# Patient Record
Sex: Female | Born: 1945
Health system: Southern US, Community
[De-identification: ages and names within clinical notes are randomized; demographics above are authoritative.]

## PROBLEM LIST (undated history)

## (undated) DIAGNOSIS — I429 Cardiomyopathy, unspecified: Secondary | ICD-10-CM

## (undated) DIAGNOSIS — F419 Anxiety disorder, unspecified: Secondary | ICD-10-CM

## (undated) DIAGNOSIS — F329 Major depressive disorder, single episode, unspecified: Secondary | ICD-10-CM

## (undated) DIAGNOSIS — F32A Depression, unspecified: Secondary | ICD-10-CM

## (undated) DIAGNOSIS — E860 Dehydration: Secondary | ICD-10-CM

## (undated) DIAGNOSIS — F0390 Unspecified dementia without behavioral disturbance: Secondary | ICD-10-CM

## (undated) DIAGNOSIS — I499 Cardiac arrhythmia, unspecified: Secondary | ICD-10-CM

## (undated) DIAGNOSIS — N189 Chronic kidney disease, unspecified: Secondary | ICD-10-CM

## (undated) DIAGNOSIS — Z87442 Personal history of urinary calculi: Secondary | ICD-10-CM

## (undated) DIAGNOSIS — B019 Varicella without complication: Secondary | ICD-10-CM

## (undated) DIAGNOSIS — E876 Hypokalemia: Secondary | ICD-10-CM

## (undated) DIAGNOSIS — I10 Essential (primary) hypertension: Secondary | ICD-10-CM

## (undated) HISTORY — DX: Cardiomyopathy, unspecified: I42.9

## (undated) HISTORY — PX: EYE SURGERY: SHX253

## (undated) HISTORY — PX: DILATION AND CURETTAGE OF UTERUS: SHX78

## (undated) HISTORY — PX: TUBAL LIGATION: SHX77

## (undated) HISTORY — DX: Varicella without complication: B01.9

---

## 2006-04-17 ENCOUNTER — Ambulatory Visit: Payer: Self-pay | Admitting: Cardiology

## 2006-04-17 ENCOUNTER — Inpatient Hospital Stay (HOSPITAL_COMMUNITY): Admission: EM | Admit: 2006-04-17 | Discharge: 2006-04-20 | Payer: Self-pay | Admitting: Emergency Medicine

## 2011-11-16 ENCOUNTER — Encounter (HOSPITAL_BASED_OUTPATIENT_CLINIC_OR_DEPARTMENT_OTHER): Payer: Self-pay | Admitting: *Deleted

## 2011-11-16 ENCOUNTER — Observation Stay (HOSPITAL_BASED_OUTPATIENT_CLINIC_OR_DEPARTMENT_OTHER)
Admission: EM | Admit: 2011-11-16 | Discharge: 2011-11-18 | Disposition: A | Discharge status: Home / Self Care | Payer: Medicare Other | Attending: Internal Medicine | Admitting: Internal Medicine

## 2011-11-16 ENCOUNTER — Emergency Department (HOSPITAL_BASED_OUTPATIENT_CLINIC_OR_DEPARTMENT_OTHER): Payer: Medicare Other

## 2011-11-16 DIAGNOSIS — H9319 Tinnitus, unspecified ear: Secondary | ICD-10-CM | POA: Insufficient documentation

## 2011-11-16 DIAGNOSIS — E785 Hyperlipidemia, unspecified: Secondary | ICD-10-CM | POA: Insufficient documentation

## 2011-11-16 DIAGNOSIS — I10 Essential (primary) hypertension: Secondary | ICD-10-CM | POA: Insufficient documentation

## 2011-11-16 DIAGNOSIS — H9311 Tinnitus, right ear: Secondary | ICD-10-CM | POA: Diagnosis present

## 2011-11-16 DIAGNOSIS — R42 Dizziness and giddiness: Secondary | ICD-10-CM | POA: Insufficient documentation

## 2011-11-16 DIAGNOSIS — R55 Syncope and collapse: Principal | ICD-10-CM | POA: Insufficient documentation

## 2011-11-16 DIAGNOSIS — E876 Hypokalemia: Secondary | ICD-10-CM | POA: Insufficient documentation

## 2011-11-16 HISTORY — DX: Essential (primary) hypertension: I10

## 2011-11-16 HISTORY — DX: Hypokalemia: E87.6

## 2011-11-16 LAB — URINALYSIS, ROUTINE W REFLEX MICROSCOPIC
Protein, ur: NEGATIVE mg/dL
Specific Gravity, Urine: 1.02 (ref 1.005–1.030)
pH: 5.5 (ref 5.0–8.0)

## 2011-11-16 LAB — CBC WITH DIFFERENTIAL/PLATELET
Basophils Absolute: 0 10*3/uL (ref 0.0–0.1)
Eosinophils Absolute: 0.2 10*3/uL (ref 0.0–0.7)
Eosinophils Relative: 2 % (ref 0–5)
MCHC: 34.9 g/dL (ref 30.0–36.0)
MCV: 93 fL (ref 78.0–100.0)
Monocytes Relative: 9 % (ref 3–12)
Neutro Abs: 8.7 10*3/uL — ABNORMAL HIGH (ref 1.7–7.7)
Neutrophils Relative %: 70 % (ref 43–77)
Platelets: 321 10*3/uL (ref 150–400)
RDW: 13.1 % (ref 11.5–15.5)

## 2011-11-16 LAB — COMPREHENSIVE METABOLIC PANEL
ALT: 13 U/L (ref 0–35)
AST: 14 U/L (ref 0–37)
Chloride: 100 mEq/L (ref 96–112)
GFR calc Af Amer: 41 mL/min — ABNORMAL LOW (ref >90)
GFR calc non Af Amer: 35 mL/min — ABNORMAL LOW (ref >90)
Potassium: 3.8 mEq/L (ref 3.5–5.1)
Total Bilirubin: 0.5 mg/dL (ref 0.3–1.2)
Total Protein: 7.1 g/dL (ref 6.0–8.3)

## 2011-11-16 LAB — GLUCOSE, CAPILLARY: Glucose-Capillary: 127 mg/dL — ABNORMAL HIGH (ref 70–99)

## 2011-11-16 LAB — TROPONIN I: Troponin I: <0.3 ng/mL (ref <0.30)

## 2011-11-16 MED ORDER — SODIUM CHLORIDE 0.9 % IV BOLUS (SEPSIS)
1000.0000 mL | Freq: Once | INTRAVENOUS | Status: AC
  Administered 2011-11-16: 1000 mL via INTRAVENOUS

## 2011-11-16 MED ORDER — SODIUM CHLORIDE 0.9 % IV SOLN
INTRAVENOUS | Status: AC
  Administered 2011-11-17: 02:00:00 via INTRAVENOUS

## 2011-11-16 MED ORDER — ONDANSETRON HCL 4 MG/2ML IJ SOLN: 4.0000 mg | Freq: Three times a day (TID) | INTRAMUSCULAR | Status: AC | PRN

## 2011-11-16 NOTE — ED Notes (Signed)
Pt has been having right ear pressure and pain with dizziness since last week. Pt states that this morning she went to restroom and passed out after standing. Pt is ambulatory at this time with a steady gait. No neuro deficits noted.

## 2011-11-16 NOTE — ED Notes (Signed)
Pt states she was up to BR/voided after cath done

## 2011-11-16 NOTE — ED Notes (Signed)
Took patient's blood sugar result was: 127. Nurse Notified.

## 2011-11-16 NOTE — ED Notes (Signed)
Pt did not void when felt urge-advised of need for cath UA-agreed

## 2011-11-16 NOTE — ED Provider Notes (Signed)
History     CSN: [id number]  Arrival date & time 11/16/11  1910   First MD Initiated Contact with Patient 11/16/11 1949      Chief Complaint  Patient presents with  . Near Syncope  . Dizziness    (Consider location/radiation/quality/duration/timing/severity/associated sxs/prior treatment) HPI Patient is a 66 year old female who presents today complaining of 2 weeks of having right-sided ear pressure changes in her hearing as well as some lightheadedness and spinning sensation over the past week. This morning the patient had this sensation and then syncopized. This was unwitnessed. Patient is uncertain how long she was out. She endorsed having some chest pain preceding this. She is currently pain free. She does note that she has decreased hearing out of right ear as well as a ringing sensation in her ear. She has been seen twice in urgent care over the past 2 weeks for her ear symptoms. She has tried prednisone as well as antibiotic and a sleeping pill for her symptoms without relief. She denies any current nausea, vomiting, or spinning sensation at this time. Patient became concerned today when she passed out as this has never happened before. She has history of hypertension, hyperlipidemia, and hypokalemia but no other medical problems. There are no other associated or modifying factors. Past Medical History  Diagnosis Date  . Hypertension   . Hyperlipidemia   . Hypokalemia     History reviewed. No pertinent past surgical history.  History reviewed. No pertinent family history.  History  Substance Use Topics  . Smoking status: Never Smoker   . Smokeless tobacco: Not on file  . Alcohol Use: No    OB History    Grav Para Term Preterm Abortions TAB SAB Ect Mult Living                  Review of Systems  Constitutional: Negative.   HENT: Positive for hearing loss and tinnitus.   Eyes: Negative.   Respiratory: Negative.   Cardiovascular: Positive for chest pain.    Gastrointestinal: Negative.   Genitourinary: Negative.   Musculoskeletal: Negative.   Neurological: Positive for dizziness, syncope and light-headedness.  Hematological: Negative.   Psychiatric/Behavioral: Negative.   All other systems reviewed and are negative.    Allergies  Review of patient's allergies indicates not on file.  Home Medications  No current outpatient prescriptions on file.  BP 114/70  Pulse 79  Temp 98.3 F (36.8 C) (Oral)  Resp 18  Ht 5\' 3"  (1.6 m)  Wt 131 lb (59.421 kg)  BMI 23.21 kg/m2  SpO2 98%  Physical Exam  Nursing note and vitals reviewed. GEN: Well-developed, well-nourished female in no distress HEENT: Atraumatic, normocephalic. Oropharynx clear without erythema. TMs clear bilaterally EYES: PERRLA BL, no scleral icterus. NECK: Trachea midline, no meningismus. Full pain-free range of motion CV: regular rate and rhythm. No murmurs, rubs, or gallops PULM: No respiratory distress.  No crackles, wheezes, or rales. GI: soft, non-tender. No guarding, rebound, or tenderness. + bowel sounds  GU: deferred Neuro: cranial nerves grossly 2-12 intact, no abnormalities of strength or sensation, A and O x 3. No pronator drift. No dysmetria on finger to nose task bilaterally. MSK: Patient moves all 4 extremities symmetrically, no deformity, edema, or injury noted Skin: No rashes petechiae, purpura, or jaundice Psych: no abnormality of mood   ED Course  Procedures (including critical care time)  Indication: Syncope Please note this EKG was reviewed extemporaneously by myself.   Date: 11/16/2011  Rate: 70  Rhythm: normal sinus rhythm  QRS Axis: normal  Intervals: normal  ST/T Wave abnormalities: normal  Conduction Disutrbances:none  Narrative Interpretation: Patient with normal QT compared to previous.  Old EKG Reviewed: changes noted   Labs Reviewed  GLUCOSE, CAPILLARY - Abnormal; Notable for the following:    Glucose-Capillary 127 (*)     All  other components within normal limits  COMPREHENSIVE METABOLIC PANEL  CBC WITH DIFFERENTIAL  URINALYSIS, ROUTINE W REFLEX MICROSCOPIC  TROPONIN I   Dg Chest 2 View  11/16/2011  *RADIOLOGY REPORT*  Clinical Data: Near-syncope.  Dizziness.  CHEST - 2 VIEW  Comparison: Chest x-ray 04/17/2006.  Findings: Lung volumes are normal.  No consolidative airspace disease.  No pleural effusions.  No pneumothorax.  No pulmonary nodule or mass noted.  Pulmonary vasculature and the cardiomediastinal silhouette are within normal limits.  IMPRESSION: 1. No radiographic evidence of acute cardiopulmonary disease.  Original Report Authenticated By: Florencia Reasons, M.D.   Ct Head Wo Contrast  11/16/2011  *RADIOLOGY REPORT*  Clinical Data: Syncope.  Dizziness.  Headache.  CT HEAD WITHOUT CONTRAST  Technique:  Contiguous axial images were obtained from the base of the skull through the vertex without contrast.  Comparison: None.  Findings: No mass lesion, mass effect, midline shift, hydrocephalus, hemorrhage.  No territorial ischemia or acute infarction.  Tiny focus high attenuation is present in the region of the left middle cerebral artery (image #9 series 2) however this is unlikely to represent a hyperdense MCA sign associated with acute territorial occlusion.  There are no secondary findings of infarction.  Paranasal sinuses appear within normal limits.  IMPRESSION: No acute intracranial abnormality.  Tiny focus high attenuation in the proximal left MCA day is favored to represent partial volume averaging artifact in the absence of clinical signs of left sided cerebral infarction.  Original Report Authenticated By: Andreas Newport, M.D.     1. Syncope       MDM  Patient was evaluated by myself. Based on evaluation patient had workup for syncope including CT head, EKG, chest x-ray, urinalysis, and basic blood work. Fingerstick was within normal limits. Patient was pain-free and not experiencing vertigo here. She  was given a liter of normal saline IV bolus during workup. Chest x-ray and head CT were unremarkable. Head CT did note a tiny focus of high attenuation in the proximal left MCA. Patient had elevated BUN/creatinine ratio but otherwise workup was largely unremarkable. She felt slightly better after 1 L of normal saline IV bolus. Given her age and her episode of syncope and that he was reasonable to admit her for observation. Patient was discussed with hospitalist accepted for transfer.        Cyndra Numbers, MD 11/16/11 [phone]

## 2011-11-16 NOTE — ED Notes (Signed)
Pt also states that she has lost 8 lbs in past week or so due to not eating because if her dizziness and ear pressure.

## 2011-11-16 NOTE — ED Notes (Signed)
Pt up to BR-steady gait-nun's cap in toilet for UO-pt states she missed-scant amt in cap

## 2011-11-17 ENCOUNTER — Encounter (HOSPITAL_COMMUNITY): Payer: Self-pay | Admitting: Internal Medicine

## 2011-11-17 ENCOUNTER — Observation Stay (HOSPITAL_COMMUNITY): Payer: Medicare Other

## 2011-11-17 DIAGNOSIS — E785 Hyperlipidemia, unspecified: Secondary | ICD-10-CM | POA: Diagnosis present

## 2011-11-17 DIAGNOSIS — H9311 Tinnitus, right ear: Secondary | ICD-10-CM | POA: Diagnosis present

## 2011-11-17 DIAGNOSIS — I10 Essential (primary) hypertension: Secondary | ICD-10-CM

## 2011-11-17 DIAGNOSIS — R55 Syncope and collapse: Principal | ICD-10-CM

## 2011-11-17 DIAGNOSIS — H9319 Tinnitus, unspecified ear: Secondary | ICD-10-CM

## 2011-11-17 LAB — LIPID PANEL
Cholesterol: 118 mg/dL (ref 0–200)
HDL: 33 mg/dL — ABNORMAL LOW (ref >39)
Total CHOL/HDL Ratio: 3.6 RATIO
Triglycerides: 75 mg/dL (ref <150)

## 2011-11-17 LAB — CARDIAC PANEL(CRET KIN+CKTOT+MB+TROPI)
CK, MB: 1.7 ng/mL (ref 0.3–4.0)
CK, MB: 1.7 ng/mL (ref 0.3–4.0)
CK, MB: 1.6 ng/mL (ref 0.3–4.0)
Relative Index: INVALID (ref 0.0–2.5)
Total CK: 38 U/L (ref 7–177)
Total CK: 37 U/L (ref 7–177)

## 2011-11-17 LAB — CBC
Hemoglobin: 11.3 g/dL — ABNORMAL LOW (ref 12.0–15.0)
MCV: 94.3 fL (ref 78.0–100.0)
Platelets: 244 10*3/uL (ref 150–400)
RBC: 3.53 MIL/uL — ABNORMAL LOW (ref 3.87–5.11)
WBC: 7.9 10*3/uL (ref 4.0–10.5)

## 2011-11-17 LAB — MAGNESIUM: Magnesium: 1.9 mg/dL (ref 1.5–2.5)

## 2011-11-17 LAB — COMPREHENSIVE METABOLIC PANEL
ALT: 9 U/L (ref 0–35)
CO2: 23 mEq/L (ref 19–32)
Calcium: 8.1 mg/dL — ABNORMAL LOW (ref 8.4–10.5)
Creatinine, Ser: 1.27 mg/dL — ABNORMAL HIGH (ref 0.50–1.10)
GFR calc Af Amer: 50 mL/min — ABNORMAL LOW (ref >90)
GFR calc non Af Amer: 43 mL/min — ABNORMAL LOW (ref >90)
Glucose, Bld: 108 mg/dL — ABNORMAL HIGH (ref 70–99)
Sodium: 142 mEq/L (ref 135–145)
Total Protein: 5.6 g/dL — ABNORMAL LOW (ref 6.0–8.3)

## 2011-11-17 MED ORDER — ASPIRIN EC 81 MG PO TBEC
81.0000 mg | DELAYED_RELEASE_TABLET | Freq: Every day | ORAL | Status: DC
  Administered 2011-11-17 – 2011-11-18 (×2): 81 mg via ORAL
  Filled 2011-11-17 (×2): qty 1

## 2011-11-17 MED ORDER — SODIUM CHLORIDE 0.9 % IJ SOLN
3.0000 mL | Freq: Two times a day (BID) | INTRAMUSCULAR | Status: DC
  Administered 2011-11-17: 3 mL via INTRAVENOUS

## 2011-11-17 MED ORDER — ACETAMINOPHEN 325 MG PO TABS: 650.0000 mg | ORAL_TABLET | Freq: Four times a day (QID) | ORAL | Status: DC | PRN

## 2011-11-17 MED ORDER — ONDANSETRON HCL 4 MG PO TABS: 4.0000 mg | ORAL_TABLET | Freq: Four times a day (QID) | ORAL | Status: DC | PRN

## 2011-11-17 MED ORDER — GADOBENATE DIMEGLUMINE 529 MG/ML IV SOLN
10.0000 mL | Freq: Once | INTRAVENOUS | Status: AC
  Administered 2011-11-17: 10 mL via INTRAVENOUS

## 2011-11-17 MED ORDER — ACETAMINOPHEN 650 MG RE SUPP: 650.0000 mg | Freq: Four times a day (QID) | RECTAL | Status: DC | PRN

## 2011-11-17 MED ORDER — MORPHINE SULFATE 2 MG/ML IJ SOLN: 1.0000 mg | INTRAMUSCULAR | Status: DC | PRN

## 2011-11-17 MED ORDER — POTASSIUM CHLORIDE CRYS ER 20 MEQ PO TBCR
20.0000 meq | EXTENDED_RELEASE_TABLET | Freq: Two times a day (BID) | ORAL | Status: DC
  Administered 2011-11-17 – 2011-11-18 (×4): 20 meq via ORAL
  Filled 2011-11-17 (×6): qty 1

## 2011-11-17 MED ORDER — ZOLPIDEM TARTRATE 5 MG PO TABS
5.0000 mg | ORAL_TABLET | Freq: Every evening | ORAL | Status: DC | PRN
  Administered 2011-11-17: 5 mg via ORAL
  Filled 2011-11-17: qty 1

## 2011-11-17 MED ORDER — SODIUM CHLORIDE 0.9 % IV SOLN
INTRAVENOUS | Status: DC
  Administered 2011-11-17 (×2): via INTRAVENOUS

## 2011-11-17 MED ORDER — LORAZEPAM 0.5 MG PO TABS
0.5000 mg | ORAL_TABLET | Freq: Four times a day (QID) | ORAL | Status: DC | PRN
  Administered 2011-11-17: 0.5 mg via ORAL
  Filled 2011-11-17: qty 1

## 2011-11-17 MED ORDER — ENOXAPARIN SODIUM 40 MG/0.4ML ~~LOC~~ SOLN
40.0000 mg | Freq: Every day | SUBCUTANEOUS | Status: DC
  Administered 2011-11-17 – 2011-11-18 (×2): 40 mg via SUBCUTANEOUS
  Filled 2011-11-17 (×2): qty 0.4

## 2011-11-17 MED ORDER — ONDANSETRON HCL 4 MG/2ML IJ SOLN
4.0000 mg | Freq: Four times a day (QID) | INTRAMUSCULAR | Status: DC | PRN
  Administered 2011-11-18: 4 mg via INTRAVENOUS
  Filled 2011-11-17: qty 2

## 2011-11-17 NOTE — Progress Notes (Signed)
  Echocardiogram 2D Echocardiogram has been performed.  Jacqualynn Parco FRANCES 11/17/2011, 11:55 AM

## 2011-11-17 NOTE — Care Management Note (Unsigned)
    Page 1 of 1   11/18/2011     10:45:08 AM   CARE MANAGEMENT NOTE 11/18/2011  Patient:  Colleen Davis, Colleen Davis   Account Number:  [id number]  Date Initiated:  11/17/2011  Documentation initiated by:  SIMMONS,Bosco Paparella  Subjective/Objective Assessment:   ADMITTED WITH PRE-SYNCOPE; LIVES AT HOME ALONE; WAS IPTA; USES [facility] ON [address] FOR RX.     Action/Plan:   DISCHARGE PLANNING DISCUSSED AT BEDSIDE.   Anticipated DC Date:  11/18/2011   Anticipated DC Plan:  HOME/SELF CARE      DC Planning Services  CM consult      Choice offered to / List presented to:             Status of service:  In process, will continue to follow Medicare Important Message given?   (If response is "NO", the following Medicare IM given date fields will be blank) Date Medicare IM given:   Date Additional Medicare IM given:    Discharge Disposition:    Per UR Regulation:  Reviewed for med. necessity/level of care/duration of stay  If discussed at Long Length of Stay Meetings, dates discussed:    Comments:  11/18/11  1044  Latori Beggs SIMMONS RN, BSN [phone] MD-  PLEASE ORDER AND SIGN OP PT FORM IN FRONT OF SHADOW CHART FOR OP PT. THANKS.   11/17/11  1121  Chase Arnall SIMMONS RN, BSN [phone] NCM WILL FOLLOW.

## 2011-11-17 NOTE — Evaluation (Signed)
Physical Therapy Evaluation Patient Details Name: Colleen Davis MRN: [id number] DOB: Apr 24, 1945 Today's Date: 11/17/2011 Time: 4098-1191 PT Time Calculation (min): 51 min  PT Assessment / Plan / Recommendation Clinical Impression  pt presents with R ear sounds of "ocean roaring", MD notes R ear infection, however pt notes no ear infection.  pt indicates initially MD put her on prednisone without relief, then an antibiotic and sleeping pill that only helped her to fall asleep, but no resolution of symptoms.  pt indicates 3 episodes of dizziness and "funny feelings" on her R side.  pt with + nystagmus and symtoms during gait with head turns and with hallpike to R.  Performed repositioning for R BPPV and pt ed on x1 exercises and gaze stabilization during mobility.  Feel pt would benefit from ENT f/u as pt notes she ahd been told that she might have tinnitus and that the "roaring" noise she hears is permanent.  pt would also benefit from OPPT for Vestibular f/u.      PT Assessment  Patient needs continued PT services    Follow Up Recommendations  Outpatient PT (Vestibular f/u)    Barriers to Discharge None      Equipment Recommendations  None recommended by PT    Recommendations for Other Services     Frequency Min 3X/week    Precautions / Restrictions Precautions Precautions: Fall Restrictions Weight Bearing Restrictions: No   Pertinent Vitals/Pain Denies Pain.        Mobility  Bed Mobility Bed Mobility: Supine to Sit;Sitting - Scoot to [place] of Bed;Sit to Supine Rolling Right: 7: Independent Rolling Left: 7: Independent Left Sidelying to Sit: 7: Independent Supine to Sit: 7: Independent Sitting - Scoot to Edge of Bed: 7: Independent Sit to Supine: 7: Independent Transfers Transfers: Sit to Stand;Stand to Sit Sit to Stand: 5: Supervision;From bed Stand to Sit: 5: Supervision;To bed Ambulation/Gait Ambulation/Gait Assistance: 5: Supervision Ambulation Distance (Feet): 180  Feet Assistive device: None Ambulation/Gait Assistance Details: pt moves very slowly and cautiously.  When normalizing speed and adding head turns pt becomes unsteady and has LOB requiring MinA to correct.  Mild Nystagmus noted with pt feeling like she is falling to R side.  Symptoms last ~59min at longest.  Vertical head movements elicit symptoms, but not as severe.   Gait Pattern: Step-through pattern;Decreased stride length Stairs: No Wheelchair Mobility Wheelchair Mobility: No    Exercises     PT Diagnosis: Difficulty walking  PT Problem List: Decreased activity tolerance;Decreased balance;Decreased mobility PT Treatment Interventions: Gait training;Stair training;Functional mobility training;Therapeutic activities;Therapeutic exercise;Balance training;Neuromuscular re-education;Patient/family education   PT Goals Acute Rehab PT Goals PT Goal Formulation: With patient Time For Goal Achievement: 11/24/11 Potential to Achieve Goals: Good Pt will go Sit to Stand: Independently PT Goal: Sit to Stand - Progress: Goal set today Pt will go Stand to Sit: Independently PT Goal: Stand to Sit - Progress: Goal set today Pt will Ambulate: >150 feet;Independently PT Goal: Ambulate - Progress: Goal set today Pt will Perform Home Exercise Program: Independently PT Goal: Perform Home Exercise Program - Progress: Goal set today  Visit Information  Last PT Received On: 11/17/11 Assistance Needed: +1 PT/OT Co-Evaluation/Treatment: Yes    Subjective Data  Subjective: IT's so hard to describe.   Patient Stated Goal: BAck to normal.     Prior Functioning  Home Living Lives With: Alone Available Help at Discharge: Family;Available PRN/intermittently Type of Home: Mobile home Home Access: Ramped entrance Home Layout: One level Bathroom Shower/Tub: Tub/shower  unit;Curtain Bathroom Toilet: Pharmacist, community: No Home Adaptive Equipment: Walker - standard Prior Function Level of  Independence: Independent Able to Take Stairs?: Yes Driving: Yes Vocation: Retired Musician: No difficulties Dominant Hand: Right    Cognition  Overall Cognitive Status: Appears within functional limits for tasks assessed/performed Arousal/Alertness: Awake/alert Orientation Level: Appears intact for tasks assessed Behavior During Session: [place] for tasks performed    Extremity/Trunk Assessment Right Upper Extremity Assessment RUE ROM/Strength/Tone: [facility] for tasks assessed Left Upper Extremity Assessment LUE ROM/Strength/Tone: WFL for tasks assessed Right Lower Extremity Assessment RLE ROM/Strength/Tone: Within functional levels RLE Sensation: WFL - Light Touch Left Lower Extremity Assessment LLE ROM/Strength/Tone: Within functional levels LLE Sensation: WFL - Light Touch Trunk Assessment Trunk Assessment: Normal   Balance Balance Balance Assessed: Yes Dynamic Sitting Balance Dynamic Sitting - Balance Support: Feet supported Dynamic Sitting - Level of Assistance: 7: Independent Dynamic Sitting - Balance Activities: Forward lean/weight shifting  End of Session PT - End of Session Equipment Utilized During Treatment: Gait belt Activity Tolerance: Patient tolerated treatment well Patient left: in bed;with call bell/phone within reach;with nursing in room Nurse Communication: Mobility status  GP Functional Assessment Tool Used: Clinical Judgement Functional Limitation: Mobility: Walking and moving around Mobility: Walking and Moving Around Current Status (Z6109): At least 1 percent but less than 20 percent impaired, limited or restricted Mobility: Walking and Moving Around Goal Status [phone]): 0 percent impaired, limited or restricted   [name], [place] [phone] 11/17/2011, 11:47 AM

## 2011-11-17 NOTE — H&P (Signed)
Colleen Davis is an 66 y.o. female.   Chief Complaint: Dizziness and syncope HPI: 66 year-old female with known history of hypertension and hyperlipidemia who has been doing fine until last night when she started feeling dizzy. She has been having problems have right ear and was being treated for it yet 8 and possibly infection. She has been on antibiotics for about a week. Mainly taking amoxicillin. She was also placed on trazodone for lack of sleeping. Since then she's been feeling dizzy and tired. She got up today and almost passed out. This happened several times during the day and was taken to [facility]. She has not had a repeat episode since being admitted. She continues to have tinnitus in the right ear. No discharge. No nausea vomiting or diarrhea.  Past Medical History  Diagnosis Date  . Hypertension   . Hyperlipidemia   . Hypokalemia     History reviewed. No pertinent past surgical history.  History reviewed. No pertinent family history. Social History:  reports that she has never smoked. She does not have any smokeless tobacco history on file. She reports that she does not drink alcohol or use illicit drugs.  Allergies: No Known Allergies  Medications Prior to Admission  Medication Sig Dispense Refill  . amoxicillin (AMOXIL) 875 MG tablet Take 875 mg by mouth 2 (two) times daily.      Marland Kitchen aspirin 81 MG tablet Take 81 mg by mouth daily.      Marland Kitchen lisinopril-hydrochlorothiazide (PRINZIDE,ZESTORETIC) 20-25 MG per tablet Take 1 tablet by mouth daily.      . potassium chloride SA (K-DUR,KLOR-CON) 20 MEQ tablet Take 20 mEq by mouth 2 (two) times daily.      . traZODone (DESYREL) 50 MG tablet Take 50 mg by mouth at bedtime.        Results for orders placed during the hospital encounter of 11/16/11 (from the past 48 hour(s))  GLUCOSE, CAPILLARY     Status: Abnormal   Collection Time   11/16/11  7:35 PM      Component Value Range Comment   Glucose-Capillary 127 (*) 70 - 99  mg/dL    Comment 1 Notify RN     COMPREHENSIVE METABOLIC PANEL     Status: Abnormal   Collection Time   11/16/11  8:50 PM      Component Value Range Comment   Sodium 139  135 - 145 mEq/L    Potassium 3.8  3.5 - 5.1 mEq/L    Chloride 100  96 - 112 mEq/L    CO2 26  19 - 32 mEq/L    Glucose, Bld 117 (*) 70 - 99 mg/dL    BUN 38 (*) 6 - 23 mg/dL    Creatinine, Ser 6.21 (*) 0.50 - 1.10 mg/dL    Calcium 9.4  8.4 - 30.8 mg/dL    Total Protein 7.1  6.0 - 8.3 g/dL    Albumin 4.5  3.5 - 5.2 g/dL    AST 14  0 - 37 U/L    ALT 13  0 - 35 U/L    Alkaline Phosphatase 84  39 - 117 U/L    Total Bilirubin 0.5  0.3 - 1.2 mg/dL    GFR calc non Af Amer 35 (*) >90 mL/min    GFR calc Af Amer 41 (*) >90 mL/min   CBC WITH DIFFERENTIAL     Status: Abnormal   Collection Time   11/16/11  8:50 PM  Component Value Range Comment   WBC 12.4 (*) 4.0 - 10.5 K/uL    RBC 4.13  3.87 - 5.11 MIL/uL    Hemoglobin 13.4  12.0 - 15.0 g/dL    HCT 30.8  65.7 - 84.6 %    MCV 93.0  78.0 - 100.0 fL    MCH 32.4  26.0 - 34.0 pg    MCHC 34.9  30.0 - 36.0 g/dL    RDW 96.2  95.2 - 84.1 %    Platelets 321  150 - 400 K/uL    Neutrophils Relative 70  43 - 77 %    Lymphocytes Relative 19  12 - 46 %    Monocytes Relative 9  3 - 12 %    Eosinophils Relative 2  0 - 5 %    Basophils Relative 0  0 - 1 %    Neutro Abs 8.7 (*) 1.7 - 7.7 K/uL    Lymphs Abs 2.4  0.7 - 4.0 K/uL    Monocytes Absolute 1.1 (*) 0.1 - 1.0 K/uL    Eosinophils Absolute 0.2  0.0 - 0.7 K/uL    Basophils Absolute 0.0  0.0 - 0.1 K/uL   TROPONIN I     Status: Normal   Collection Time   11/16/11  8:50 PM      Component Value Range Comment   Troponin I <0.30  <0.30 ng/mL   URINALYSIS, ROUTINE W REFLEX MICROSCOPIC     Status: Normal   Collection Time   11/16/11  9:41 PM      Component Value Range Comment   Color, Urine YELLOW  YELLOW    APPearance CLEAR  CLEAR    Specific Gravity, Urine 1.020  1.005 - 1.030    pH 5.5  5.0 - 8.0    Glucose, UA NEGATIVE   NEGATIVE mg/dL    Hgb urine dipstick NEGATIVE  NEGATIVE    Bilirubin Urine NEGATIVE  NEGATIVE    Ketones, ur NEGATIVE  NEGATIVE mg/dL    Protein, ur NEGATIVE  NEGATIVE mg/dL    Urobilinogen, UA 1.0  0.0 - 1.0 mg/dL    Nitrite NEGATIVE  NEGATIVE    Leukocytes, UA NEGATIVE  NEGATIVE MICROSCOPIC NOT DONE ON URINES WITH NEGATIVE PROTEIN, BLOOD, LEUKOCYTES, NITRITE, OR GLUCOSE <1000 mg/dL.   Dg Chest 2 View  11/16/2011  *RADIOLOGY REPORT*  Clinical Data: Near-syncope.  Dizziness.  CHEST - 2 VIEW  Comparison: Chest x-ray 04/17/2006.  Findings: Lung volumes are normal.  No consolidative airspace disease.  No pleural effusions.  No pneumothorax.  No pulmonary nodule or mass noted.  Pulmonary vasculature and the cardiomediastinal silhouette are within normal limits.  IMPRESSION: 1. No radiographic evidence of acute cardiopulmonary disease.  Original Report Authenticated By: Florencia Reasons, M.D.   Ct Head Wo Contrast  11/16/2011  *RADIOLOGY REPORT*  Clinical Data: Syncope.  Dizziness.  Headache.  CT HEAD WITHOUT CONTRAST  Technique:  Contiguous axial images were obtained from the base of the skull through the vertex without contrast.  Comparison: None.  Findings: No mass lesion, mass effect, midline shift, hydrocephalus, hemorrhage.  No territorial ischemia or acute infarction.  Tiny focus high attenuation is present in the region of the left middle cerebral artery (image #9 series 2) however this is unlikely to represent a hyperdense MCA sign associated with acute territorial occlusion.  There are no secondary findings of infarction.  Paranasal sinuses appear within normal limits.  IMPRESSION: No acute intracranial abnormality.  Tiny focus high attenuation in  the proximal left MCA day is favored to represent partial volume averaging artifact in the absence of clinical signs of left sided cerebral infarction.  Original Report Authenticated By: Andreas Newport, M.D.    Review of Systems  Constitutional:  Positive for malaise/fatigue.  HENT: Positive for ear pain and tinnitus. Negative for sore throat and ear discharge.   Eyes: Negative.   Respiratory: Negative.   Cardiovascular: Negative.   Gastrointestinal: Negative.   Genitourinary: Negative.   Musculoskeletal: Negative.   Skin: Negative.   Neurological: Positive for loss of consciousness, weakness and headaches.  Endo/Heme/Allergies: Negative.   Psychiatric/Behavioral: Negative.     Blood pressure 137/82, pulse 72, temperature 97.5 F (36.4 C), temperature source Oral, resp. rate 19, height 5\' 3"  (1.6 m), weight 62.2 kg (137 lb 2 oz), SpO2 98.00%. Physical Exam  Constitutional: She is oriented to person, place, and time. She appears well-developed and well-nourished.  HENT:  Head: Normocephalic and atraumatic.  Right Ear: External ear normal.  Left Ear: External ear normal.  Nose: Nose normal.  Mouth/Throat: Oropharynx is clear and moist.  Eyes: Conjunctivae and EOM are normal. Pupils are equal, round, and reactive to light.  Neck: Normal range of motion. Neck supple.  Cardiovascular: Normal rate, regular rhythm, normal heart sounds and intact distal pulses.   Respiratory: Effort normal and breath sounds normal.  GI: Soft. Bowel sounds are normal.  Musculoskeletal: Normal range of motion.  Neurological: She is alert and oriented to person, place, and time. She has normal reflexes.  Skin: Skin is warm and dry.  Psychiatric: She has a normal mood and affect. Her behavior is normal. Judgment and thought content normal.     Assessment/Plan A 66 year old female presenting with syncope and dizziness. More than likely the dizziness is secondary to her right ear infection or disease. She's been on antibiotics. Her syncopal episode possibly related to the new medication in this case trazodone. Patient could also have arrhythmias CVA.  Plan #1 syncope: Patient will be admitted for observation. Will check echocardiogram carotid Dopplers  and MRI of the brain. If all these tests are negative it is probably secondary to inner ear disease with possibly trazodone as a cause. Will get PT and OT to evaluate patient. Check orthostasis.  #2 tinnitus: Most likely in infection but also could be VIII cranial nerve tumor or disease. The MRI will hopefully give [place] some picture of what is going on in that area.  #3 hypertension: Continue home medications and titrate:  #4 hyperlipidemia: Check fasting lipid panel and continue her home medications.  #5 prophylaxis: Patient is on Lovenox.  GARBA,LAWAL 11/17/2011, 2:37 AM

## 2011-11-17 NOTE — Progress Notes (Signed)
Patient admitted this morning with dizziness. Noted abnormality on MRI. [facility] consult neurology for their input. Patient feels okay today. Physical therapy's recommendations noted.

## 2011-11-17 NOTE — Progress Notes (Signed)
Pt. received the scheduled dose of Potassium 20 Meq after the morning labs came back with Potassium 3.3.

## 2011-11-17 NOTE — Evaluation (Signed)
Occupational Therapy Evaluation Patient Details Name: Colleen Davis MRN: [id number] DOB: [birth year] Today's Date: 11/17/2011 Time: 4098-1191 OT Time Calculation (min): 29 min  OT Assessment / Plan / Recommendation Clinical Impression  Pt is a 66 year old woman admitted with syncope and falls.  She has been having problems have right ear and was being treated for it yet 8 and possibly infection and diagnosed with tinnitus.  Pt is at a supervision to independent level in ADL and ADL transfers due to intermittent dizziness.  Safety education completed.       OT Assessment  Patient does not need any further OT services    Follow Up Recommendations  Supervision - Intermittent    Barriers to Discharge      Equipment Recommendations  None recommended by OT    Recommendations for Other Services    Frequency       Precautions / Restrictions Precautions Precautions: Fall   Pertinent Vitals/Pain No pain    ADL  Eating/Feeding: Simulated;Independent Where Assessed - Eating/Feeding: Bed level Grooming: Simulated;Supervision/safety Where Assessed - Grooming: Unsupported standing Upper Body Bathing: Simulated;Set up Where Assessed - Upper Body Bathing: Unsupported sitting Lower Body Bathing: Simulated;Supervision/safety Where Assessed - Lower Body Bathing: Unsupported sit to stand Upper Body Dressing: Performed;Set up Where Assessed - Upper Body Dressing: Unsupported sitting Lower Body Dressing: Performed;Supervision/safety Where Assessed - Lower Body Dressing: Unsupported sit to stand Equipment Used: Gait belt Transfers/Ambulation Related to ADLs: Pt requiring min guard assist until challenged with head turns and then had LOB requiring min assist to regain balance.    OT Diagnosis:    OT Problem List:   OT Treatment Interventions:     OT Goals    Visit Information  Last OT Received On: 11/17/11 Assistance Needed: +1 PT/OT Co-Evaluation/Treatment: Yes    Subjective Data  Subjective: "I went to urgent care." Patient Stated Goal: Resolve issues with dizziness.   Prior Functioning  Vision/Perception  Home Living Lives With: Alone Available Help at Discharge: Family;Available PRN/intermittently Type of Home: Mobile home Home Access: Ramped entrance Home Layout: One level Bathroom Shower/Tub: Tub/shower unit;Curtain Firefighter: Standard Bathroom Accessibility: No Home Adaptive Equipment: Environmental consultant - standard Prior Function Level of Independence: Independent Able to Take Stairs?: Yes Driving: Yes Vocation: Retired Musician: No difficulties Dominant Hand: Right      Cognition  Overall Cognitive Status: Appears within functional limits for tasks assessed/performed Arousal/Alertness: Awake/alert Orientation Level: Appears intact for tasks assessed Behavior During Session: [facility] for tasks performed    Extremity/Trunk Assessment Right Upper Extremity Assessment RUE ROM/Strength/Tone: [facility] for tasks assessed Left Upper Extremity Assessment LUE ROM/Strength/Tone: WFL for tasks assessed Trunk Assessment Trunk Assessment: Normal   Mobility Bed Mobility Bed Mobility: Supine to Sit;Sitting - Scoot to Edge of Bed;Sit to Supine Supine to Sit: 7: Independent Sitting - Scoot to Edge of Bed: 7: Independent Sit to Supine: 7: Independent Transfers Transfers: Sit to Stand;Stand to Sit Sit to Stand: 5: Supervision;From bed Stand to Sit: 5: Supervision;To bed   Exercise    Balance Balance Balance Assessed: Yes Dynamic Sitting Balance Dynamic Sitting - Balance Support: Feet supported Dynamic Sitting - Level of Assistance: 7: Independent Dynamic Sitting - Balance Activities: Forward lean/weight shifting  End of Session OT - End of Session Equipment Utilized During Treatment: Gait belt Activity Tolerance: Patient tolerated treatment well Patient left: in bed;with family/visitor present;Other (comment) (with PT performing vestibular  evaluation)  GO Functional Assessment Tool Used: clinical observation/judgement Functional Limitation: Self care Self Care  Current Status [phone]): 0 percent impaired, limited or restricted Self Care Goal Status ([phone]): 0 percent impaired, limited or restricted Self Care Discharge Status [phone]): 0 percent impaired, limited or restricted   Evern Bio 11/17/2011, 10:29 AM [phone]

## 2011-11-18 NOTE — Progress Notes (Signed)
Physical Therapy Treatment Patient Details Name: Colleen Davis MRN: [id number] DOB: 1945-06-23 Today's Date: 11/18/2011 Time: 7829-5621 PT Time Calculation (min): 23 min  PT Assessment / Plan / Recommendation Comments on Treatment Session  Patient progressing well with ambulation. She did not complain of dizziness this session and complained of slight ringing in ear. Continue ambulation with RW    Follow Up Recommendations  Outpatient PT (Vestibular f/u)    Barriers to Discharge        Equipment Recommendations  Other (comment) (TBD)    Recommendations for Other Services    Frequency Min 3X/week   Plan Discharge plan remains appropriate;Frequency remains appropriate    Precautions / Restrictions Precautions Precautions: Fall Restrictions Weight Bearing Restrictions: No   Pertinent Vitals/Pain     Mobility  Bed Mobility Supine to Sit: 7: Independent Sitting - Scoot to Edge of Bed: 7: Independent Transfers Sit to Stand: 5: Supervision;With upper extremity assist;From bed;From chair/3-in-1 Stand to Sit: 5: Supervision;With upper extremity assist;To bed;To chair/3-in-1 Details for Transfer Assistance: Cues for safe hand placement and technique.  Ambulation/Gait Ambulation/Gait Assistance: 5: Supervision Ambulation Distance (Feet): 320 Feet Assistive device: Rolling walker Ambulation/Gait Assistance Details: Ambulated patient with RW. Patient states that she feels more stable with RW and complained of no dizziness with ambulation Gait Pattern: Step-through pattern;Decreased stride length    Exercises     PT Diagnosis:    PT Problem List:   PT Treatment Interventions:     PT Goals Acute Rehab PT Goals PT Goal: Sit to Stand - Progress: Progressing toward goal PT Goal: Stand to Sit - Progress: Progressing toward goal PT Goal: Ambulate - Progress: Progressing toward goal  Visit Information  Last PT Received On: 11/18/11 Assistance Needed: +1    Subjective Data      Cognition  Overall Cognitive Status: Appears within functional limits for tasks assessed/performed Arousal/Alertness: Awake/alert Orientation Level: Appears intact for tasks assessed Behavior During Session: [facility] for tasks performed    Balance     End of Session PT - End of Session Equipment Utilized During Treatment: Gait belt Activity Tolerance: Patient tolerated treatment well Patient left: in bed;with call bell/phone within reach Nurse Communication: Mobility status   GP Functional Assessment Tool Used: clinical judgement Functional Limitation: Mobility: Walking and moving around Mobility: Walking and Moving Around Current Status [phone]): At least 1 percent but less than 20 percent impaired, limited or restricted Mobility: Walking and Moving Around Goal Status [phone]): 0 percent impaired, limited or restricted   Fredrich Birks 11/18/2011, 9:40 AM 11/18/2011 Fredrich Birks PTA [phone] pager [phone] office

## 2011-11-18 NOTE — Discharge Summary (Signed)
Physician Discharge Summary  Colleen Davis ZOX:[id number] DOB: March 02, 1946 DOA: 11/16/2011  PCP: No primary provider on file.  Admit date: 11/16/2011 Discharge date: 11/18/2011  Recommendations for Outpatient Follow-up:  1.  Follow-up Information    Follow up with Physical therapy. (for vestibular rehab)       Schedule an appointment as soon as possible for a visit with [facility] .   Contact information:   [address] [place] [address] [phone]      Schedule an appointment as soon as possible for a visit with ENT.         Discharge Diagnoses:  Syncope and collapse  HTN (hypertension)  Tinnitus of right ear  Hyperlipidemia   Discharge Condition: Stable Disposition: HomeHome Diet recommendation: Heart healthy diet   Wt Readings from Last 3 Encounters:  11/17/11 62.2 kg (137 lb 2 oz)    History of present illness:  66 year-old female with known history of hypertension and hyperlipidemia who has been doing fine until last night when she started feeling dizzy. She has been having problems have right ear and was being treated for it yet 8 and possibly infection. She has been on antibiotics for about a week. Mainly taking amoxicillin. She was also placed on trazodone for lack of sleeping. Since then she's been feeling dizzy and tired. She got up today and almost passed out. This happened several times during the day and was taken to [facility]. She has not had a repeat episode since being admitted. She continues to have tinnitus in the right ear. No discharge. No nausea vomiting or diarrhea   Hospital Course:   #1 Syncope/Benign Paroxysmal Positional Vertigo: Patient had 2-D echo carotid Dopplers and MRI of the brain done. MRI showed some  nonspecific findings.  Discussed the abnormality with Dr. Thad Ranger. Per our discussion she thinks this is most likely secondary to the hypertension. Patient will be discharged home to  followup outpatient with neurology.  #2Tinnitus/BPPV: Patient to followup outpatient with ENT and also at the  vestibular clinic. #3Hypertension: Patient to continue taking her home medication #4 Hyperlipidemia:Follow up outpatient.  #5 Abnormal MRI: Discussed with neurology Dr. Thad Ranger who reviewed the MRI.Marland Kitchen Changes are most likely related to hypertension. Recommendation is to follow up outpatient with neurology. Discussed this with the patient.     Discharge Instructions  Discharge Orders    Future Orders Please Complete By Expires   Diet - low sodium heart healthy      Increase activity slowly      Discharge instructions      Comments:   Patient would like prescription for Ativan. She will discuss this with her primary care physician.     Medication List  As of 11/18/2011  2:15 PM   TAKE these medications         amoxicillin 875 MG tablet   Commonly known as: AMOXIL   Take 875 mg by mouth 2 (two) times daily.      aspirin 81 MG tablet   Take 81 mg by mouth daily.      lisinopril-hydrochlorothiazide 20-25 MG per tablet   Commonly known as: PRINZIDE,ZESTORETIC   Take 1 tablet by mouth daily.      potassium chloride SA 20 MEQ tablet   Commonly known as: K-DUR,KLOR-CON   Take 20 mEq by mouth 2 (two) times daily.      traZODone 50 MG tablet   Commonly known as: DESYREL   Take  50 mg by mouth at bedtime.           Follow-up Information    Follow up with Physical therapy. (for vestibular rehab)       Schedule an appointment as soon as possible for a visit with [facility] .   Contact information:   [address] [place] [address] [phone]      Schedule an appointment as soon as possible for a visit with ENT.          The results of significant diagnostics from this hospitalization (including imaging, microbiology, ancillary and laboratory) are listed below for reference.    Significant Diagnostic Studies: Dg  Chest 2 View  11/16/2011  *RADIOLOGY REPORT*  Clinical Data: Near-syncope.  Dizziness.  CHEST - 2 VIEW  Comparison: Chest x-ray 04/17/2006.  Findings: Lung volumes are normal.  No consolidative airspace disease.  No pleural effusions.  No pneumothorax.  No pulmonary nodule or mass noted.  Pulmonary vasculature and the cardiomediastinal silhouette are within normal limits.  IMPRESSION: 1. No radiographic evidence of acute cardiopulmonary disease.  Original Report Authenticated By: Florencia Reasons, M.D.   Ct Head Wo Contrast  11/16/2011  *RADIOLOGY REPORT*  Clinical Data: Syncope.  Dizziness.  Headache.  CT HEAD WITHOUT CONTRAST  Technique:  Contiguous axial images were obtained from the base of the skull through the vertex without contrast.  Comparison: None.  Findings: No mass lesion, mass effect, midline shift, hydrocephalus, hemorrhage.  No territorial ischemia or acute infarction.  Tiny focus high attenuation is present in the region of the left middle cerebral artery (image #9 series 2) however this is unlikely to represent a hyperdense MCA sign associated with acute territorial occlusion.  There are no secondary findings of infarction.  Paranasal sinuses appear within normal limits.  IMPRESSION: No acute intracranial abnormality.  Tiny focus high attenuation in the proximal left MCA day is favored to represent partial volume averaging artifact in the absence of clinical signs of left sided cerebral infarction.  Original Report Authenticated By: Andreas Newport, M.D.   Mr Laqueta Jean Wo Contrast  11/17/2011  *RADIOLOGY REPORT*  Clinical Data: Syncope  MRI HEAD WITHOUT AND WITH CONTRAST  Technique:  Multiplanar, multiecho pulse sequences of the brain and surrounding structures were obtained according to standard protocol without and with intravenous contrast  Contrast: 10mL MULTIHANCE GADOBENATE DIMEGLUMINE 529 MG/ML IV SOLN  Comparison: CT head without contrast 11/16/2011  Findings: The diffusion weighted  images demonstrate no evidence for acute or subacute infarction.  Mild generalized atrophy is present. Extensive subcortical T2 and FLAIR hyperintensities are present. The ventricles are of normal size.  Flow is present in the major intracranial arteries.  The globes and orbits are intact.  The paranasal sinuses and mastoid air cells are clear.  The postcontrast images demonstrate no pathologic enhancement.  IMPRESSION:  1.  No acute intracranial abnormality. 2.  Moderate subcortical T2 and FLAIR hyperintensities bilaterally. The finding is nonspecific but can be seen in the setting of chronic microvascular ischemia, a demyelinating process such as multiple sclerosis, vasculitis, complicated migraine headaches, or as the sequelae of a prior infectious or inflammatory process.  Original Report Authenticated By: Jamesetta Orleans. MATTERN, M.D.     Labs: Basic Metabolic Panel:  Lab 11/17/11 6213 11/16/11 2050  NA 142 139  K 3.3* 3.8  CL 107 100  CO2 23 26  GLUCOSE 108* 117*  BUN 27* 38*  CREATININE 1.27* 1.50*  CALCIUM 8.1* 9.4  MG 1.9 --  PHOS -- --   Liver Function Tests:  Lab 11/17/11 0319 11/16/11 2050  AST 11 14  ALT 9 13  ALKPHOS 69 84  BILITOT 0.4 0.5  PROT 5.6* 7.1  ALBUMIN 3.2* 4.5   No results found for this basename: LIPASE:5,AMYLASE:5 in the last 168 hours No results found for this basename: AMMONIA:5 in the last 168 hours CBC:  Lab 11/17/11 0319 11/16/11 2050  WBC 7.9 12.4*  NEUTROABS -- 8.7*  HGB 11.3* 13.4  HCT 33.3* 38.4  MCV 94.3 93.0  PLT 244 321   Cardiac Enzymes:  Lab 11/17/11 2045 11/17/11 1032 11/17/11 0301 11/16/11 2050  CKTOTAL 37 35 38 --  CKMB 1.6 1.7 1.7 --  CKMBINDEX -- -- -- --  TROPONINI <0.30 <0.30 <0.30 <0.30   BNP: BNP (last 3 results)  Basename 11/17/11 0301  PROBNP 68.2   CBG:  Lab 11/16/11 1935  GLUCAP 127*      Time coordinating discharge: 55 Signed: Molli Posey, MD [facility] 11/18/2011, 2:15 PM

## 2011-11-19 ENCOUNTER — Encounter (HOSPITAL_COMMUNITY): Payer: Self-pay | Admitting: *Deleted

## 2011-11-19 ENCOUNTER — Emergency Department (HOSPITAL_COMMUNITY)
Admission: EM | Admit: 2011-11-19 | Discharge: 2011-11-19 | Disposition: A | Discharge status: Home / Self Care | Payer: Medicare Other | Attending: Emergency Medicine | Admitting: Emergency Medicine

## 2011-11-19 DIAGNOSIS — Z711 Person with feared health complaint in whom no diagnosis is made: Secondary | ICD-10-CM

## 2011-11-19 DIAGNOSIS — K029 Dental caries, unspecified: Secondary | ICD-10-CM | POA: Insufficient documentation

## 2011-11-19 DIAGNOSIS — R220 Localized swelling, mass and lump, head: Secondary | ICD-10-CM | POA: Insufficient documentation

## 2011-11-19 DIAGNOSIS — I10 Essential (primary) hypertension: Secondary | ICD-10-CM | POA: Insufficient documentation

## 2011-11-19 DIAGNOSIS — E785 Hyperlipidemia, unspecified: Secondary | ICD-10-CM | POA: Insufficient documentation

## 2011-11-19 NOTE — ED Notes (Signed)
Reports being on antibiotics recently for right side dental abscess. Reports last night getting swelling to right side of face and right arm. Then reports suddenly having swelling today to bilateral lower legs and ankles. Only mild swelling noted at triage, airway intact, no acute distress noted.

## 2011-11-19 NOTE — ED Provider Notes (Signed)
Medical screening examination/treatment/procedure(s) were performed by non-physician practitioner and as supervising physician I was immediately available for consultation/collaboration.   Takenya Travaglini, MD 11/19/11 2222 

## 2011-11-19 NOTE — ED Provider Notes (Signed)
History     CSN: [id number]  Arrival date & time 11/19/11  1758   First MD Initiated Contact with Patient 11/19/11 1959      Chief Complaint  Patient presents with  . Leg Swelling  . Facial Swelling   HPI  History provided by the patient. Patient is a 66 year old female with history of hypertension, hyperlipidemia who presents with concerns for possible adverse reaction to amoxicillin. Patient reports having a third dose of amoxicillin early this morning around 8 AM. This evening around 6 PM patient reports developing some sensations of swelling on her right face and in her bilateral feet. Patient is taking amoxicillin for a infected right upper molar tooth and ear infection. Patient denies having any previous adverse reactions to amoxicillin. She denies any rash or pruritus. She denies any swelling of the tongue, lips or throat. She denies any typical to breathing or shortness of breath. She denies any chest pain or heart palpitations.     Past Medical History  Diagnosis Date  . Hypertension   . Hyperlipidemia   . Hypokalemia     History reviewed. No pertinent past surgical history.  History reviewed. No pertinent family history.  History  Substance Use Topics  . Smoking status: Never Smoker   . Smokeless tobacco: Not on file  . Alcohol Use: No    OB History    Grav Para Term Preterm Abortions TAB SAB Ect Mult Living                  Review of Systems  Constitutional: Negative for fever and chills.  HENT: Positive for facial swelling and dental problem. Negative for sore throat.   Respiratory: Negative for shortness of breath and wheezing.   Cardiovascular: Negative for chest pain and palpitations.  Musculoskeletal: Positive for joint swelling.  Neurological: Negative for dizziness, light-headedness and headaches.    Allergies  Review of patient's allergies indicates no known allergies.  Home Medications   Current Outpatient Rx  Name Route Sig Dispense  Refill  . AMOXICILLIN 875 MG PO TABS Oral Take 875 mg by mouth 2 (two) times daily.    . ASPIRIN 81 MG PO TABS Oral Take 81 mg by mouth daily.    Marland Kitchen LISINOPRIL-HYDROCHLOROTHIAZIDE 20-25 MG PO TABS Oral Take 1 tablet by mouth daily.    Marland Kitchen POTASSIUM CHLORIDE CRYS ER 20 MEQ PO TBCR Oral Take 20 mEq by mouth daily.       BP 131/78  Pulse 93  Temp 98.1 F (36.7 C) (Oral)  Resp 18  SpO2 95%  Physical Exam  Nursing note and vitals reviewed. Constitutional: She is oriented to person, place, and time. She appears well-developed and well-nourished. No distress.  HENT:  Head: Normocephalic and atraumatic.  Right Ear: Tympanic membrane and external ear normal.  Left Ear: Tympanic membrane and external ear normal.  Mouth/Throat: Oropharynx is clear and moist.       There is dental decay 2 right upper molar. Several teeth have been extracted throughout mouth and right upper side. No significant swelling of the gums. No significant swelling of the face and no loss of nasolabial fold. Oropharynx clear, uvula midline.  Neck: Normal range of motion. Neck supple.       No appreciable swelling of neck.  Cardiovascular: Normal rate and regular rhythm.   No murmur heard. Pulmonary/Chest: Effort normal and breath sounds normal. No stridor. No respiratory distress. She has no wheezes. She has no rales.  Abdominal: Soft.  Musculoskeletal:  Normal range of motion. She exhibits no tenderness.  Lymphadenopathy:    She has no cervical adenopathy.  Neurological: She is alert and oriented to person, place, and time.  Skin: Skin is warm and dry. No rash noted.  Psychiatric: She has a normal mood and affect. Her behavior is normal.    ED Course  Procedures        1. Physically well but worried       MDM  8:01PM patient seen and evaluated. Patient in no acute distress. No appreciable signs for swelling on exam. No signs concerning for serious adverse reaction to medication.        Angus Seller, [place] 11/19/11 2040

## 2014-12-15 ENCOUNTER — Emergency Department (HOSPITAL_COMMUNITY)
Admission: EM | Admit: 2014-12-15 | Discharge: 2014-12-15 | Disposition: A | Discharge status: Home / Self Care | Payer: Medicare Other | Attending: Emergency Medicine | Admitting: Emergency Medicine

## 2014-12-15 ENCOUNTER — Encounter (HOSPITAL_COMMUNITY): Payer: Self-pay | Admitting: Emergency Medicine

## 2014-12-15 DIAGNOSIS — Y998 Other external cause status: Secondary | ICD-10-CM | POA: Insufficient documentation

## 2014-12-15 DIAGNOSIS — S50861A Insect bite (nonvenomous) of right forearm, initial encounter: Secondary | ICD-10-CM | POA: Insufficient documentation

## 2014-12-15 DIAGNOSIS — Z8659 Personal history of other mental and behavioral disorders: Secondary | ICD-10-CM | POA: Insufficient documentation

## 2014-12-15 DIAGNOSIS — Z8639 Personal history of other endocrine, nutritional and metabolic disease: Secondary | ICD-10-CM | POA: Insufficient documentation

## 2014-12-15 DIAGNOSIS — Y9389 Activity, other specified: Secondary | ICD-10-CM | POA: Insufficient documentation

## 2014-12-15 DIAGNOSIS — W57XXXA Bitten or stung by nonvenomous insect and other nonvenomous arthropods, initial encounter: Secondary | ICD-10-CM | POA: Diagnosis not present

## 2014-12-15 DIAGNOSIS — I10 Essential (primary) hypertension: Secondary | ICD-10-CM | POA: Insufficient documentation

## 2014-12-15 DIAGNOSIS — Z79899 Other long term (current) drug therapy: Secondary | ICD-10-CM | POA: Insufficient documentation

## 2014-12-15 DIAGNOSIS — Y9289 Other specified places as the place of occurrence of the external cause: Secondary | ICD-10-CM | POA: Insufficient documentation

## 2014-12-15 DIAGNOSIS — T63304A Toxic effect of unspecified spider venom, undetermined, initial encounter: Secondary | ICD-10-CM

## 2014-12-15 HISTORY — DX: Anxiety disorder, unspecified: F41.9

## 2014-12-15 MED ORDER — HYDROCORTISONE 1 % EX CREA
TOPICAL_CREAM | Freq: Two times a day (BID) | CUTANEOUS | Status: DC
  Administered 2014-12-15: 09:00:00 via TOPICAL
  Filled 2014-12-15: qty 28

## 2014-12-15 NOTE — ED Provider Notes (Signed)
  Face-to-face evaluation   History: She complains of burning sensation of her left forearm which started yesterday evening. She reports it was red and raised, and now just "burns". No specific known trauma. Suspected spider bite.  Physical exam: Alert, calm, cooperative. Left forearm with flat area about 4 cm in diameter without associated vesicle, puncture, ecchymosis or fluctuance. This rash is indistinct and most likely represents an allergic reaction.  Medical screening examination/treatment/procedure(s) were conducted as a shared visit with non-physician practitioner(s) and myself.  I personally evaluated the patient during the encounter  Mancel Bale, MD 12/17/14 2134

## 2014-12-15 NOTE — ED Notes (Signed)
Patient states she thinks she was bitten by a spider last night around 7 pm.   Patient states did not actually see a spider, but has a red, raised area come up and it burns.   Patient states no other symptoms.   Patient states she did take tylenol for the pain last night.

## 2014-12-15 NOTE — Discharge Instructions (Signed)

## 2014-12-15 NOTE — ED Provider Notes (Signed)
CSN: [id number]     Arrival date & time 12/15/14  0804 History   First MD Initiated Contact with Patient 12/15/14 0815     Chief Complaint  Patient presents with  . Insect Bite     (Consider location/radiation/quality/duration/timing/severity/associated sxs/prior Treatment) HPI   Colleen Davis 69 y.o.female  PCP: Lupe Carney, MD   SIGNIFICANT PMH: hypertension, hyperlipidemia, hypokalemia CHIEF COMPLAINT: rash to arm  When: 7 o clock last night How: patient believes she was bit by a spider Frequency: new Duration: since yesterday evening  Location: left forearm Radiation: does not radiate Quality: burning sensation, pain described as a 9/10 - patient not subjectively showing signs of discomfort. Alleviating factors: None Worsening factors: touching the rash Treatments tried: Tylenol, ice and Benadryl, she doesn't feel like either of these have helped Associated Symptoms: burning and redness Negative ROS: Confusion, diaphoresis, fever, headache, weakness (general or focal), change of vision,  neck pain, dysphagia, aphagia, chest pain, shortness of breath,  back pain, abdominal pains, nausea, vomiting, diarrhea, lower extremity swelling..   Past Medical History  Diagnosis Date  . Hypertension   . Hyperlipidemia   . Hypokalemia   . Anxiety    History reviewed. No pertinent past surgical history. No family history on file. Social History  Substance Use Topics  . Smoking status: Never Smoker   . Smokeless tobacco: None  . Alcohol Use: No   OB History    No data available     Review of Systems  10 Systems reviewed and are negative for acute change except as noted in the HPI.     Allergies  Review of patient's allergies indicates no known allergies.  Home Medications   Prior to Admission medications   Medication Sig Start Date End Date Taking? Authorizing Provider  amoxicillin (AMOXIL) 875 MG tablet Take 875 mg by mouth 2 (two) times daily.    Historical  Provider, MD  aspirin 81 MG tablet Take 81 mg by mouth daily.    Historical Provider, MD  lisinopril-hydrochlorothiazide (PRINZIDE,ZESTORETIC) 20-25 MG per tablet Take 1 tablet by mouth daily.    Historical Provider, MD  potassium chloride SA (K-DUR,KLOR-CON) 20 MEQ tablet Take 20 mEq by mouth daily.     Historical Provider, MD   BP 168/77 mmHg  Pulse 80  Temp(Src) 97.9 F (36.6 C) (Oral)  Resp 17  Ht 5\' 2"  (1.575 m)  Wt 129 lb (58.514 kg)  BMI 23.59 kg/m2  SpO2 98% Physical Exam  Constitutional: She appears well-developed and well-nourished. No distress.  HENT:  Head: Normocephalic and atraumatic.  Eyes: Pupils are equal, round, and reactive to light.  Neck: Normal range of motion. Neck supple.  Cardiovascular: Normal rate and regular rhythm.   Pulmonary/Chest: Effort normal.  Abdominal: Soft.  Neurological: She is alert.  Skin: Skin is warm and dry.     Nursing note and vitals reviewed.   ED Course  Procedures (including critical care time) Labs Review Labs Reviewed - No data to display  Imaging Review No results found. I have personally reviewed and evaluated these images and lab results as part of my medical decision-making.   EKG Interpretation None      MDM   Final diagnoses:  Spider bite, undetermined intent, initial encounter    Dr. Effie Shy saw pt as well. Recommends topical hydrocortisone cream, benadryl and cool cloth to rash.   Medications  hydrocortisone cream 1 % (not administered)    69 y.o.[name]'s evaluation in the Emergency Department  is complete. It has been determined that no acute conditions requiring further emergency intervention are present at this time. The patient/guardian have been advised of the diagnosis and plan. We have discussed signs and symptoms that warrant return to the ED, such as changes or worsening in symptoms.  Vital signs are stable at discharge. Filed Vitals:   12/15/14 0816  BP: 168/77  Pulse: 80  Temp:  97.9 F (36.6 C)  Resp: 17    Patient/guardian has voiced understanding and agreed to follow-up with the PCP or specialist.     Marlon Pel, PA-C 12/15/14 4098  Mancel Bale, MD 12/17/14 2135

## 2016-01-17 ENCOUNTER — Emergency Department (HOSPITAL_COMMUNITY): Payer: Medicare Other

## 2016-01-17 ENCOUNTER — Emergency Department (HOSPITAL_COMMUNITY)
Admission: EM | Admit: 2016-01-17 | Discharge: 2016-01-17 | Disposition: A | Discharge status: Home / Self Care | Payer: Medicare Other | Attending: Emergency Medicine | Admitting: Emergency Medicine

## 2016-01-17 ENCOUNTER — Encounter (HOSPITAL_COMMUNITY): Payer: Self-pay

## 2016-01-17 DIAGNOSIS — Y929 Unspecified place or not applicable: Secondary | ICD-10-CM | POA: Insufficient documentation

## 2016-01-17 DIAGNOSIS — Z7982 Long term (current) use of aspirin: Secondary | ICD-10-CM | POA: Insufficient documentation

## 2016-01-17 DIAGNOSIS — I10 Essential (primary) hypertension: Secondary | ICD-10-CM | POA: Insufficient documentation

## 2016-01-17 DIAGNOSIS — M79672 Pain in left foot: Secondary | ICD-10-CM | POA: Diagnosis not present

## 2016-01-17 DIAGNOSIS — Y999 Unspecified external cause status: Secondary | ICD-10-CM | POA: Diagnosis not present

## 2016-01-17 DIAGNOSIS — Y939 Activity, unspecified: Secondary | ICD-10-CM | POA: Insufficient documentation

## 2016-01-17 DIAGNOSIS — X58XXXA Exposure to other specified factors, initial encounter: Secondary | ICD-10-CM | POA: Insufficient documentation

## 2016-01-17 DIAGNOSIS — M79671 Pain in right foot: Secondary | ICD-10-CM | POA: Diagnosis not present

## 2016-01-17 DIAGNOSIS — Z79899 Other long term (current) drug therapy: Secondary | ICD-10-CM | POA: Insufficient documentation

## 2016-01-17 LAB — I-STAT CHEM 8, ED
BUN: 14 mg/dL (ref 6–20)
CALCIUM ION: 1.13 mmol/L — AB (ref 1.15–1.40)
CHLORIDE: 103 mmol/L (ref 101–111)
Creatinine, Ser: 1.2 mg/dL — ABNORMAL HIGH (ref 0.44–1.00)
Glucose, Bld: 149 mg/dL — ABNORMAL HIGH (ref 65–99)
HCT: 38 % (ref 36.0–46.0)
Hemoglobin: 12.9 g/dL (ref 12.0–15.0)
POTASSIUM: 3.9 mmol/L (ref 3.5–5.1)
SODIUM: 143 mmol/L (ref 135–145)
TCO2: 26 mmol/L (ref 0–100)

## 2016-01-17 NOTE — ED Triage Notes (Signed)
Patient complains of several weeks of bilateral foot pain, states that the pain started in July after stumping a toe on right foot. States her feet ache and burn, mild swelling noted to right 5th toe. No increased pain with ambulation.

## 2016-01-17 NOTE — Discharge Instructions (Signed)
Your xrays were negative, your labs were unremarkable.  Follow up with your family doctor for further eval.  I have referred you to neurology to be evaluated for possible peripheral neuropathy

## 2016-01-17 NOTE — ED Provider Notes (Signed)
[facility]-EMERGENCY DEPT Provider Note   CSN: [id number] Arrival date & time: 01/17/16  1401     History   Chief Complaint Chief Complaint  Patient presents with  . Foot Pain    HPI Colleen Davis is a 70 y.o. female.  HPI The patient presented to the emergency room with complaints of bilateral foot pain. Patient states these symptoms started months ago. She initially noticed it after stubbing one of her toes.   The pain persisted however and started to involve both feet. She has noticed a burning discomfort more often at night in both of her feet in the toes and also the mid and forefoot. She denies any fevers or chills. She denies any history of neuropathy or diabetes. She has not noticed any rashes. Past Medical History:  Diagnosis Date  . Anxiety   . Hyperlipidemia   . Hypertension   . Hypokalemia     Patient Active Problem List   Diagnosis Date Noted  . Syncope and collapse 11/17/2011  . HTN (hypertension) 11/17/2011  . Tinnitus of right ear 11/17/2011  . Hyperlipidemia 11/17/2011    History reviewed. No pertinent surgical history.  OB History    No data available       Home Medications    Prior to Admission medications   Medication Sig Start Date End Date Taking? Authorizing Provider  ALPRAZolam (XANAX) 0.25 MG tablet Take 0.25 mg by mouth daily.   Yes Historical Provider, MD  aspirin 81 MG tablet Take 81 mg by mouth daily as needed (for anticoagulation therapy).    Yes Historical Provider, MD  atorvastatin (LIPITOR) 20 MG tablet Take 20 mg by mouth daily at 6 PM.   Yes Historical Provider, MD  lisinopril-hydrochlorothiazide (PRINZIDE,ZESTORETIC) 20-25 MG per tablet Take 1 tablet by mouth daily.   Yes Historical Provider, MD    Family History No family history on file.  Social History Social History  Substance Use Topics  . Smoking status: Never Smoker  . Smokeless tobacco: Never Used  . Alcohol use No     Allergies   Review of patient's allergies  indicates no known allergies.   Review of Systems Review of Systems  All other systems reviewed and are negative.    Physical Exam Updated Vital Signs BP 121/74   Pulse 77   Temp 97.7 F (36.5 C) (Oral)   Resp 16   Ht 5' 2.5" (1.588 m)   Wt 54.2 kg   SpO2 97%   BMI 21.52 kg/m   Physical Exam  Constitutional: She appears well-developed and well-nourished. No distress.  HENT:  Head: Normocephalic and atraumatic.  Right Ear: External ear normal.  Left Ear: External ear normal.  Eyes: Conjunctivae are normal. Right eye exhibits no discharge. Left eye exhibits no discharge. No scleral icterus.  Neck: Neck supple. No tracheal deviation present.  Cardiovascular: Normal rate and regular rhythm.   Pulmonary/Chest: Effort normal and breath sounds normal. No stridor. No respiratory distress.  Abdominal: She exhibits no distension.  Musculoskeletal: She exhibits no edema, tenderness or deformity.  No tenderness to the bilateral feet and ankles, no erythema and no swelling, strong dorsalis pedis pulse bilaterally  Neurological: She is alert. Cranial nerve deficit: no gross deficits.  Skin: Skin is warm and dry. No rash noted. No erythema. No pallor.  Psychiatric: She has a normal mood and affect.  Nursing note and vitals reviewed.    ED Treatments / Results  Labs (all labs ordered are listed, but only abnormal  results are displayed) Labs Reviewed  I-STAT CHEM 8, ED - Abnormal; Notable for the following:       Result Value   Creatinine, Ser 1.20 (*)    Glucose, Bld 149 (*)    Calcium, Ion 1.13 (*)    All other components within normal limits    Radiology No results found.  Procedures Procedures (including critical care time)    Initial Impression / Assessment and Plan / ED Course  I have reviewed the triage vital signs and the nursing notes.  Pertinent labs & imaging results that were available during my care of the patient were reviewed by me and considered in my  medical decision making (see chart for details).  Clinical Course    Pt has no signs of infection.  Normal pulses.   Labs show mild hyperglycemia but unlikely for her to have diabetic neuropathy.  Pain does sound neuropathic with the burning bilaterally especially at night.  Will check xrays.  Most likely will dc with outpatient follow up.  Consider neuro outpatient eval.  Dr Adela LankFloyd will follow up on the xrays and dispo  Final Clinical Impressions(s) / ED Diagnoses   Pending     Linwood DibblesJon Amal Saiki, MD 01/17/16 1553

## 2016-01-17 NOTE — ED Notes (Signed)
MD at bedside. 

## 2016-06-22 ENCOUNTER — Encounter (HOSPITAL_COMMUNITY): Payer: Self-pay | Admitting: Emergency Medicine

## 2016-06-22 ENCOUNTER — Emergency Department (HOSPITAL_COMMUNITY)
Admission: EM | Admit: 2016-06-22 | Discharge: 2016-06-22 | Disposition: A | Discharge status: Home / Self Care | Payer: Medicare Other | Attending: Emergency Medicine | Admitting: Emergency Medicine

## 2016-06-22 DIAGNOSIS — I10 Essential (primary) hypertension: Secondary | ICD-10-CM | POA: Insufficient documentation

## 2016-06-22 DIAGNOSIS — Z7982 Long term (current) use of aspirin: Secondary | ICD-10-CM | POA: Insufficient documentation

## 2016-06-22 DIAGNOSIS — Z79899 Other long term (current) drug therapy: Secondary | ICD-10-CM | POA: Insufficient documentation

## 2016-06-22 DIAGNOSIS — E876 Hypokalemia: Secondary | ICD-10-CM | POA: Diagnosis not present

## 2016-06-22 DIAGNOSIS — R630 Anorexia: Secondary | ICD-10-CM | POA: Diagnosis present

## 2016-06-22 LAB — BASIC METABOLIC PANEL
Anion gap: 13 (ref 5–15)
BUN: 35 mg/dL — AB (ref 6–20)
CHLORIDE: 104 mmol/L (ref 101–111)
CO2: 23 mmol/L (ref 22–32)
CREATININE: 1.77 mg/dL — AB (ref 0.44–1.00)
Calcium: 9.8 mg/dL (ref 8.9–10.3)
GFR calc Af Amer: 33 mL/min — ABNORMAL LOW (ref >60)
GFR calc non Af Amer: 28 mL/min — ABNORMAL LOW (ref >60)
Glucose, Bld: 110 mg/dL — ABNORMAL HIGH (ref 65–99)
POTASSIUM: 2.9 mmol/L — AB (ref 3.5–5.1)
Sodium: 140 mmol/L (ref 135–145)

## 2016-06-22 LAB — CBC
HCT: 42.2 % (ref 36.0–46.0)
HEMOGLOBIN: 14.2 g/dL (ref 12.0–15.0)
MCH: 31.1 pg (ref 26.0–34.0)
MCHC: 33.6 g/dL (ref 30.0–36.0)
MCV: 92.5 fL (ref 78.0–100.0)
Platelets: 237 10*3/uL (ref 150–400)
RBC: 4.56 MIL/uL (ref 3.87–5.11)
RDW: 13 % (ref 11.5–15.5)
WBC: 8.2 10*3/uL (ref 4.0–10.5)

## 2016-06-22 MED ORDER — SODIUM CHLORIDE 0.9 % IV BOLUS (SEPSIS)
1000.0000 mL | Freq: Once | INTRAVENOUS | Status: AC
  Administered 2016-06-22: 1000 mL via INTRAVENOUS

## 2016-06-22 MED ORDER — SODIUM CHLORIDE 0.9 % IV SOLN
Freq: Once | INTRAVENOUS | Status: DC
  Filled 2016-06-22: qty 1000

## 2016-06-22 MED ORDER — POTASSIUM CHLORIDE CRYS ER 20 MEQ PO TBCR
40.0000 meq | EXTENDED_RELEASE_TABLET | Freq: Once | ORAL | Status: AC
  Administered 2016-06-22: 40 meq via ORAL
  Filled 2016-06-22: qty 2

## 2016-06-22 MED ORDER — POTASSIUM CHLORIDE ER 10 MEQ PO TBCR: 10.0000 meq | EXTENDED_RELEASE_TABLET | Freq: Every day | ORAL | 0 refills | Status: DC

## 2016-06-22 NOTE — ED Triage Notes (Signed)
Pt states for the last week she has had , " a bad smell in her nose and hasn't been able to eat any food for over 1 week". Pt denies any pain, n/v/d.

## 2016-06-22 NOTE — ED Notes (Signed)
ED Provider at bedside. 

## 2016-06-22 NOTE — Discharge Instructions (Addendum)
Please read and follow all provided instructions.  Your diagnoses today include:  1. Hypokalemia     Tests performed today include: Vital signs. See below for your results today.   Medications prescribed:  Take as prescribed   Home care instructions:  Follow any educational materials contained in this packet.  Follow-up instructions: Please follow-up with your primary care provider for further evaluation of symptoms and treatment   Also follow up with [facility] Cardiology division for evaluation of PVCs noted on EKG today.   Return instructions:  Please return to the Emergency Department if you do not get better, if you get worse, or new symptoms OR  - Fever (temperature greater than 101.72F)  - Bleeding that does not stop with holding pressure to the area    -Severe pain (please note that you may be more sore the day after your accident)  - Chest Pain  - Difficulty breathing  - Severe nausea or vomiting  - Inability to tolerate food and liquids  - Passing out  - Skin becoming red around your wounds  - Change in mental status (confusion or lethargy)  - New numbness or weakness    Please return if you have any other emergent concerns.  Additional Information:  Your vital signs today were: BP 121/80 (BP Location: Left Arm)    Pulse 96    Temp 97.9 F (36.6 C) (Oral)    Resp 18    Ht 5\' 2"  (1.575 m)    Wt 47.4 kg    SpO2 99%    BMI 19.11 kg/m  If your blood pressure (BP) was elevated above 135/85 this visit, please have this repeated by your doctor within one month. ---------------

## 2016-06-22 NOTE — ED Provider Notes (Signed)
[facility]-EMER[facility]Y DEPT Provider Note   CSN: [id number] Arrival date & time: 06/22/16  1021     History   Chief Complaint Chief Complaint  Patient presents with  . Anorexia    HPI Colleen Davis is a 71 y.o. female.  HPI 71 y.o. female with a hx of Anxiety, HTN, HLD, presents to the Emergency Department today complaining of a "bad smell" in her nose x 1 week. Unable to eat for over a week due to these symptoms. Notes no pain currently. No N/V/D. No fevers. No headaches. No numbness/tingling. No URI symptoms. Notes hx same in past that resolved spontaneously. Has been drinking a grape soda as this is the only thing she can tolerate taste wise. No other symptoms noted.    Past Medical History:  Diagnosis Date  . Anxiety   . Hyperlipidemia   . Hypertension   . Hypokalemia     Patient Active Problem List   Diagnosis Date Noted  . Syncope and collapse 11/17/2011  . HTN (hypertension) 11/17/2011  . Tinnitus of right ear 11/17/2011  . Hyperlipidemia 11/17/2011    History reviewed. No pertinent surgical history.  OB History    No data available       Home Medications    Prior to Admission medications   Medication Sig Start Date End Date Taking? Authorizing Provider  ALPRAZolam (XANAX) 0.25 MG tablet Take 0.25 mg by mouth daily.    Historical Provider, MD  aspirin 81 MG tablet Take 81 mg by mouth daily as needed (for anticoagulation therapy).     Historical Provider, MD  atorvastatin (LIPITOR) 20 MG tablet Take 20 mg by mouth daily at 6 PM.    Historical Provider, MD  lisinopril-hydrochlorothiazide (PRINZIDE,ZESTORETIC) 20-25 MG per tablet Take 1 tablet by mouth daily.    Historical Provider, MD    Family History No family history on file.  Social History Social History  Substance Use Topics  . Smoking status: Never Smoker  . Smokeless tobacco: Never Used  . Alcohol use No     Allergies   Patient has no known allergies.   Review of Systems Review of  Systems ROS reviewed and all are negative for acute change except as noted in the HPI.  Physical Exam Updated Vital Signs BP 121/80 (BP Location: Left Arm)   Pulse 96   Temp 97.9 F (36.6 C) (Oral)   Resp 18   Ht 5\' 2"  (1.575 m)   Wt 47.4 kg   SpO2 99%   BMI 19.11 kg/m   Physical Exam  Constitutional: She is oriented to person, place, and time. Vital signs are normal. She appears well-developed and well-nourished.  HENT:  Head: Normocephalic and atraumatic.  Right Ear: Hearing normal.  Left Ear: Hearing normal.  Nose: Nose normal.  Eyes: Conjunctivae and EOM are normal. Pupils are equal, round, and reactive to light.  Neck: Normal range of motion. Neck supple.  Cardiovascular: Normal rate, normal heart sounds and intact distal pulses.  An irregularly irregular rhythm present.  Pulmonary/Chest: Effort normal and breath sounds normal. No respiratory distress. She has no wheezes. She has no rales. She exhibits no tenderness.  Abdominal: Soft. Bowel sounds are normal. There is no tenderness.  Musculoskeletal: Normal range of motion.  Neurological: She is alert and oriented to person, place, and time.  Cranial Nerves:  II: Pupils equal, round, reactive to light III,IV, VI: ptosis not present, extra-ocular motions intact bilaterally  V,VII: smile symmetric, facial light touch sensation equal VIII:  hearing grossly normal bilaterally  IX,X: midline uvula rise  XI: bilateral shoulder shrug equal and strong XII: midline tongue extension  Skin: Skin is warm and dry.  Psychiatric: She has a normal mood and affect. Her speech is normal and behavior is normal. Thought content normal.  Nursing note and vitals reviewed.  ED Treatments / Results  Labs (all labs ordered are listed, but only abnormal results are displayed) Labs Reviewed  BASIC METABOLIC PANEL - Abnormal; Notable for the following:       Result Value   Potassium 2.9 (*)    Glucose, Bld 110 (*)    BUN 35 (*)     Creatinine, Ser 1.77 (*)    GFR calc non Af Amer 28 (*)    GFR calc Af Amer 33 (*)    All other components within normal limits  CBC    EKG  EKG Interpretation None       Radiology No results found.  Procedures Procedures (including critical care time)  Medications Ordered in ED Medications - No data to display   Initial Impression / Assessment and Plan / ED Course  I have reviewed the triage vital signs and the nursing notes.  Pertinent labs & imaging results that were available during my care of the patient were reviewed by me and considered in my medical decision making (see chart for details).  Final Clinical Impressions(s) / ED Diagnoses  {I have reviewed and evaluated the relevant laboratory values.   {I have reviewed the relevant previous healthcare records.  {I obtained HPI from historian. {Patient discussed with supervising physician.  ED Course:  Assessment: Pt is a 71 y.o. female with hx HTN, HLD,  who presents with decrease appetite x 1 week. Only drinking grape soda. Hx same. No pain associated with PO intake. No N/V/D. No CP/SOB/ABD pain. On exam, pt in NAD. Nontoxic/nonseptic appearing. VSS. Afebrile. Lungs CTA. Heart RRR. EKG showed infrequent PVCs. Will send to cardiology as outpatient. Abdomen nontender soft. Labs with hypokalemia noted at 2.9. Given IV potassium. Mild elevation in Creatinine from baseline. Plan is to DC home with follow up to PCP. Encouraged to eat and see PCP. At time of discharge, Patient is in no acute distress. Vital Signs are stable. Patient is able to ambulate. Patient able to tolerate PO.   Disposition/Plan:  DC Home Additional Verbal discharge instructions given and discussed with patient.  Pt Instructed to f/u with PCP in the next week for evaluation and treatment of symptoms. Return precautions given Pt acknowledges and agrees with plan  Supervising Physician Melene Plan, DO  Final diagnoses:  Hypokalemia    New  Prescriptions New Prescriptions   No medications on file     Audry Pili, PA-C 06/22/16 1440    Melene Plan, DO 06/22/16 1440

## 2016-07-15 ENCOUNTER — Encounter (HOSPITAL_COMMUNITY): Payer: Self-pay | Admitting: *Deleted

## 2016-07-15 ENCOUNTER — Emergency Department (HOSPITAL_COMMUNITY)
Admission: EM | Admit: 2016-07-15 | Discharge: 2016-07-15 | Disposition: A | Discharge status: Home / Self Care | Payer: Medicare Other | Attending: Emergency Medicine | Admitting: Emergency Medicine

## 2016-07-15 ENCOUNTER — Emergency Department (HOSPITAL_COMMUNITY): Payer: Medicare Other

## 2016-07-15 DIAGNOSIS — I10 Essential (primary) hypertension: Secondary | ICD-10-CM | POA: Diagnosis not present

## 2016-07-15 DIAGNOSIS — R110 Nausea: Secondary | ICD-10-CM

## 2016-07-15 DIAGNOSIS — Z79899 Other long term (current) drug therapy: Secondary | ICD-10-CM | POA: Diagnosis not present

## 2016-07-15 DIAGNOSIS — E860 Dehydration: Secondary | ICD-10-CM | POA: Insufficient documentation

## 2016-07-15 DIAGNOSIS — R935 Abnormal findings on diagnostic imaging of other abdominal regions, including retroperitoneum: Secondary | ICD-10-CM | POA: Insufficient documentation

## 2016-07-15 DIAGNOSIS — K8020 Calculus of gallbladder without cholecystitis without obstruction: Secondary | ICD-10-CM | POA: Insufficient documentation

## 2016-07-15 DIAGNOSIS — R531 Weakness: Secondary | ICD-10-CM | POA: Diagnosis present

## 2016-07-15 LAB — COMPREHENSIVE METABOLIC PANEL
ALBUMIN: 4.2 g/dL (ref 3.5–5.0)
ALK PHOS: 126 U/L (ref 38–126)
ALT: 15 U/L (ref 14–54)
AST: 23 U/L (ref 15–41)
Anion gap: 14 (ref 5–15)
BUN: 19 mg/dL (ref 6–20)
CHLORIDE: 103 mmol/L (ref 101–111)
CO2: 25 mmol/L (ref 22–32)
CREATININE: 0.98 mg/dL (ref 0.44–1.00)
Calcium: 9.4 mg/dL (ref 8.9–10.3)
GFR calc non Af Amer: 58 mL/min — ABNORMAL LOW (ref >60)
Glucose, Bld: 87 mg/dL (ref 65–99)
Potassium: 3.2 mmol/L — ABNORMAL LOW (ref 3.5–5.1)
Sodium: 142 mmol/L (ref 135–145)
Total Bilirubin: 1.4 mg/dL — ABNORMAL HIGH (ref 0.3–1.2)
Total Protein: 7.3 g/dL (ref 6.5–8.1)

## 2016-07-15 LAB — CBC WITH DIFFERENTIAL/PLATELET
Basophils Absolute: 0 10*3/uL (ref 0.0–0.1)
Basophils Relative: 1 %
EOS ABS: 0.1 10*3/uL (ref 0.0–0.7)
Eosinophils Relative: 1 %
HEMATOCRIT: 40.2 % (ref 36.0–46.0)
HEMOGLOBIN: 13.5 g/dL (ref 12.0–15.0)
LYMPHS PCT: 20 %
Lymphs Abs: 1.1 10*3/uL (ref 0.7–4.0)
MCH: 31.3 pg (ref 26.0–34.0)
MCHC: 33.6 g/dL (ref 30.0–36.0)
MCV: 93.3 fL (ref 78.0–100.0)
MONOS PCT: 7 %
Monocytes Absolute: 0.4 10*3/uL (ref 0.1–1.0)
NEUTROS PCT: 71 %
Neutro Abs: 4 10*3/uL (ref 1.7–7.7)
Platelets: 186 10*3/uL (ref 150–400)
RBC: 4.31 MIL/uL (ref 3.87–5.11)
RDW: 12.9 % (ref 11.5–15.5)
WBC: 5.5 10*3/uL (ref 4.0–10.5)

## 2016-07-15 LAB — LIPASE, BLOOD: Lipase: 27 U/L (ref 11–51)

## 2016-07-15 MED ORDER — IOPAMIDOL (ISOVUE-300) INJECTION 61%
INTRAVENOUS | Status: AC
  Administered 2016-07-15: 80 mL
  Filled 2016-07-15: qty 100

## 2016-07-15 MED ORDER — SODIUM CHLORIDE 0.9 % IV BOLUS (SEPSIS)
2000.0000 mL | Freq: Once | INTRAVENOUS | Status: AC
  Administered 2016-07-15: 2000 mL via INTRAVENOUS

## 2016-07-15 MED ORDER — ONDANSETRON HCL 4 MG/2ML IJ SOLN
4.0000 mg | Freq: Once | INTRAMUSCULAR | Status: AC
  Administered 2016-07-15: 4 mg via INTRAVENOUS
  Filled 2016-07-15: qty 2

## 2016-07-15 MED ORDER — ONDANSETRON 8 MG PO TBDP: 8.0000 mg | ORAL_TABLET | Freq: Three times a day (TID) | ORAL | 0 refills | Status: DC | PRN

## 2016-07-15 NOTE — ED Notes (Signed)
Pt transported to CT ?

## 2016-07-15 NOTE — ED Triage Notes (Signed)
Pt complains that food tastes and smells bad for the past 3 weeks and has not been able to eat as much. Pt states she has had dizziness occasionally but denies loss of consciousness.

## 2016-07-15 NOTE — ED Notes (Signed)
Pt states she has had no appetitite and lost weight  For 3 weeks was seen here and sent to ENT dr that looked in her nose and is suppose to go for CT but they have not called her yet, she is back because she is still  nauseaous and not eating

## 2016-07-15 NOTE — ED Notes (Signed)
Pt was not able to urinate. 

## 2016-07-15 NOTE — ED Provider Notes (Signed)
[facility]-EMERGENCY DEPT Provider Note   CSN: [id number] Arrival date & time: 07/15/16  1213     History   Chief Complaint Chief Complaint  Patient presents with  . not able to eat    food tastes and smells bad    HPI Colleen Davis is a 71 y.o. female.  HPI Patient presents to the emergency department to complaints of increasing generalized weakness and fatigue as well as anorexia.  She's had 10-15 pounds of weight loss.  She reports anytime she thinks about food or tries to eat food she becomes nauseated.  Denies abdominal pain.  No fevers or chills.  No chest pain shortness of breath.  No cough.  Denies urinary frequency or dysuria.  Her mother died of a possible mass in her stomach and her sister passed away of colon cancer.  She is 71 years old and is never had a colonoscopy.  She reports food has a bad smell and generalized anorexia.   Past Medical History:  Diagnosis Date  . Anxiety   . Hyperlipidemia   . Hypertension   . Hypokalemia     Patient Active Problem List   Diagnosis Date Noted  . Syncope and collapse 11/17/2011  . HTN (hypertension) 11/17/2011  . Tinnitus of right ear 11/17/2011  . Hyperlipidemia 11/17/2011    History reviewed. No pertinent surgical history.  OB History    No data available       Home Medications    Prior to Admission medications   Medication Sig Start Date End Date Taking? Authorizing Provider  ALPRAZolam (XANAX) 0.25 MG tablet Take 0.25 mg by mouth daily.   Yes Historical Provider, MD  atorvastatin (LIPITOR) 20 MG tablet Take 20 mg by mouth daily at 6 PM.   Yes Historical Provider, MD  lisinopril-hydrochlorothiazide (PRINZIDE,ZESTORETIC) 20-25 MG per tablet Take 1 tablet by mouth daily.   Yes Historical Provider, MD  potassium chloride (K-DUR) 10 MEQ tablet Take 1 tablet (10 mEq total) by mouth daily. 06/22/16  Yes Audry Pili, PA-C    Family History No family history on file.  Social History Social History  Substance Use  Topics  . Smoking status: Never Smoker  . Smokeless tobacco: Never Used  . Alcohol use No     Allergies   Patient has no known allergies.   Review of Systems Review of Systems  All other systems reviewed and are negative.    Physical Exam Updated Vital Signs BP 116/87   Pulse 71   Temp 98.5 F (36.9 C)   Resp 19   SpO2 98%   Physical Exam  Constitutional: She is oriented to person, place, and time.  Pale and somewhat cachectic  HENT:  Head: Normocephalic and atraumatic.  Eyes: EOM are normal.  Neck: Normal range of motion.  Cardiovascular: Normal rate, regular rhythm and normal heart sounds.   Pulmonary/Chest: Effort normal and breath sounds normal.  Abdominal: Soft. She exhibits no distension. There is no tenderness.  Musculoskeletal: Normal range of motion.  Neurological: She is alert and oriented to person, place, and time.  Skin: Skin is warm and dry.  Psychiatric: She has a normal mood and affect. Judgment normal.  Nursing note and vitals reviewed.    ED Treatments / Results  Labs (all labs ordered are listed, but only abnormal results are displayed) Labs Reviewed  COMPREHENSIVE METABOLIC PANEL - Abnormal; Notable for the following:       Result Value   Potassium 3.2 (*)  Total Bilirubin 1.4 (*)    GFR calc non Af Amer 58 (*)    All other components within normal limits  CBC WITH DIFFERENTIAL/PLATELET  LIPASE, BLOOD    EKG  EKG Interpretation None       Radiology Ct Abdomen Pelvis W Contrast  Result Date: 07/15/2016 CLINICAL DATA:  Anorexia over the past 3 weeks. Food tastes since miles bad per patient. Dizziness. EXAM: CT ABDOMEN AND PELVIS WITH CONTRAST TECHNIQUE: Multidetector CT imaging of the abdomen and pelvis was performed using the standard protocol following bolus administration of intravenous contrast. CONTRAST:  ISOVUE-300 IOPAMIDOL (ISOVUE-300) INJECTION 61% COMPARISON:  None. FINDINGS: Lower chest: Minimal scarring or atelectasis  at the left lung base. Normal size cardiac chambers. No pericardial effusion. Hepatobiliary: Innumerable small hypodensities scattered throughout the liver statistically consistent with cysts, largest is in left hepatic measuring 1.6 cm in diameter. The gallbladder is distended and there is is a 1.8 cm gallstone near the neck. No wall thickening or pericholecystic fluid is seen however. Pancreas: Unremarkable. No pancreatic ductal dilatation or surrounding inflammatory changes. Spleen: Normal in size without focal abnormality. Adrenals/Urinary Tract: Normal bilateral adrenal glands. Polycystic appearance of both kidneys. No solid enhancing mass lesions are apparent. No nephrolithiasis. No hydronephrosis. No ureteral dilatation or calculi. No bladder is physiologically distended. Stomach/Bowel: Nondistended stomach. Normal small bowel rotation without obstruction. Contracted appearance of the cecum and ascending colon with submucosal fatty deposition noted possibly related to changes of inflammatory bowel. No acute bowel obstruction is noted. Vascular/Lymphatic: Aortic atherosclerosis. No enlarged abdominal or pelvic lymph nodes. Reproductive: Uterus and bilateral adnexa are unremarkable. Other: No abdominal wall hernia or abnormality. No abdominopelvic ascites. Musculoskeletal: Degenerative disc disease L2-3 and L3-4. No acute osseous appearing abnormalities. IMPRESSION: 1. Polycystic appearance of the kidneys with associated cysts of the liver which may reflect autosomal dominant polycystic renal disease. 2. Mild submucosal fatty deposition within cecum ascending colon without acute bowel inflammation or obstruction noted. Findings may reflect sequela of inflammatory bowel disease. 3. Cholelithiasis with gallbladder distention possibly from a fasting state. No wall thickening or pericholecystic fluid however is noted. Electronically Signed   By: Tollie Eth M.D.   On: 07/15/2016 17:23    Procedures Procedures  (including critical care time)  Medications Ordered in ED Medications  sodium chloride 0.9 % bolus 2,000 mL (2,000 mLs Intravenous New Bag/Given 07/15/16 1533)  ondansetron (ZOFRAN) injection 4 mg (4 mg Intravenous Given 07/15/16 1533)     Initial Impression / Assessment and Plan / ED Course  I have reviewed the triage vital signs and the nursing notes.  Pertinent labs & imaging results that were available during my care of the patient were reviewed by me and considered in my medical decision making (see chart for details).     Patient will be hydrated this time.  Labs, urine, CT scan of her abdomen and pelvis pending.  Concern for possible abdominal cancer given anorexia, weight loss, nausea associated with food.  No focal pain at this time.  No clear etiology found on CT imaging or labs. Pt will need outpatient follow up with pcp and likely GI.  I think given the patients family hx of colon cancer she will need colonoscopy for further evaluation. This was relayed to the patient and family. Gallstones noted. Referral to [facility] as well for evaluation of her gallstones  Final Clinical Impressions(s) / ED Diagnoses   Final diagnoses:  Dehydration  Nausea  Gallstones    New Prescriptions New Prescriptions  No medications on file     Azalia Bilis, MD 07/16/16 [phone]

## 2016-08-01 ENCOUNTER — Ambulatory Visit: Payer: Self-pay | Admitting: Surgery

## 2016-08-01 NOTE — H&P (Signed)
History of Present Illness Colleen Davis. Burlie Cajamarca MD; 08/01/2016 1:03 PM) The patient is a 71 year old female who presents for evaluation of gall stones. Referred by Dr. Donnie Coffin for symptomatic gallstones GI - Dr. Penelope Coop Scheduled for EGD/ colonoscopy later this week  This is a 71 year old female who presents with a one-month history of persistent nausea and vomiting as well as weight loss. She has had significant constipation. She denies any diarrhea. Her symptoms seem to be exacerbated by eating. She is able to keep down liquids. Zofran does not seem to be effective in controlling her nausea. She was evaluated in the emergency department on 3/30 and had a CT scan of the abdomen and pelvis showing gallstones but no sign of acute cholecystitis. She also had some mild submucosal fatty deposition within the cecum and ascending colon. The etiology of this was unclear. The patient has never had a colonoscopy despite a family history of colon cancer in her sister. The patient continues to be fairly nauseated and continues to lose weight. She is scheduled for EGD and colonoscopy on 08/04/16.  07/15/16 T bili 1.4. all other LFT's WNL  CLINICAL DATA: Anorexia over the past 3 weeks. Food tastes since miles bad per patient. Dizziness.  EXAM: CT ABDOMEN AND PELVIS WITH CONTRAST  TECHNIQUE: Multidetector CT imaging of the abdomen and pelvis was performed using the standard protocol following bolus administration of intravenous contrast.  CONTRAST: ISOVUE-300 IOPAMIDOL (ISOVUE-300) INJECTION 61%  COMPARISON: None.  FINDINGS: Lower chest: Minimal scarring or atelectasis at the left lung base. Normal size cardiac chambers. No pericardial effusion.  Hepatobiliary: Innumerable small hypodensities scattered throughout the liver statistically consistent with cysts, largest is in left hepatic measuring 1.6 cm in diameter. The gallbladder is distended and there is is a 1.8 cm gallstone near  the neck. No wall thickening or pericholecystic fluid is seen however.  Pancreas: Unremarkable. No pancreatic ductal dilatation or surrounding inflammatory changes.  Spleen: Normal in size without focal abnormality.  Adrenals/Urinary Tract: Normal bilateral adrenal glands. Polycystic appearance of both kidneys. No solid enhancing mass lesions are apparent. No nephrolithiasis. No hydronephrosis. No ureteral dilatation or calculi. No bladder is physiologically distended.  Stomach/Bowel: Nondistended stomach. Normal small bowel rotation without obstruction. Contracted appearance of the cecum and ascending colon with submucosal fatty deposition noted possibly related to changes of inflammatory bowel. No acute bowel obstruction is noted.  Vascular/Lymphatic: Aortic atherosclerosis. No enlarged abdominal or pelvic lymph nodes.  Reproductive: Uterus and bilateral adnexa are unremarkable.  Other: No abdominal wall hernia or abnormality. No abdominopelvic ascites.  Musculoskeletal: Degenerative disc disease L2-3 and L3-4. No acute osseous appearing abnormalities.  IMPRESSION: 1. Polycystic appearance of the kidneys with associated cysts of the liver which may reflect autosomal dominant polycystic renal disease. 2. Mild submucosal fatty deposition within cecum ascending colon without acute bowel inflammation or obstruction noted. Findings may reflect sequela of inflammatory bowel disease. 3. Cholelithiasis with gallbladder distention possibly from a fasting state. No wall thickening or pericholecystic fluid however is noted.   Electronically Signed By: Ashley Royalty M.D. On: 07/15/2016 17:23   Past Surgical History (Janette Ranson, CMA; 08/01/2016 10:10 AM) No pertinent past surgical history  Diagnostic Studies History (Janette Ranson, CMA; 08/01/2016 10:10 AM) Colonoscopy never Mammogram >3 years ago Pap Smear >5 years ago  Allergies (Janette Ranson, CMA; 08/01/2016  10:13 AM) No Known Drug Allergies 08/01/2016 Allergies Reconciled  Medication History (Janette Ranson, CMA; 08/01/2016 10:14 AM) ALPRAZolam (0.25MG Tablet, Oral) Active. Atorvastatin Calcium (20MG Tablet, Oral)  Active. Ondansetron (8MG Tablet Disint, Oral) Active. Lisinopril (20MG Tablet, Oral) Active. Potassium Chloride ER (10MEQ Tablet ER, Oral) Active. Suprep Bowel Prep Kit (17.5-3.13-1.6GM/180ML Solution, Oral) Active. Medications Reconciled  Social History (Janette Ranson, CMA; 08/01/2016 10:10 AM) Caffeine use Carbonated beverages. No alcohol use No drug use Tobacco use Never smoker.  Family History (Janette Ranson, CMA; 08/01/2016 10:10 AM) Cancer Father. Colon Cancer Sister. Hypertension Mother. Thyroid problems Mother.  Pregnancy / Birth History (Janette Ranson, CMA; 08/01/2016 10:10 AM) Age at menarche 6 years. Age of menopause 76-50 Gravida 5 Maternal age 19-25 Para 2  Other Problems (Janette Ranson, CMA; 08/01/2016 10:10 AM) Anxiety Disorder Back Pain Cholelithiasis Gastroesophageal Reflux Disease High blood pressure Hypercholesterolemia     Review of Systems (Janette Ranson CMA; 08/01/2016 10:10 AM) General Present- Appetite Loss and Weight Loss. Not Present- Chills, Fatigue, Fever, Night Sweats and Weight Gain. Skin Not Present- Change in Wart/Mole, Dryness, Hives, Jaundice, New Lesions, Non-Healing Wounds, Rash and Ulcer. HEENT Present- Hearing Loss and Ringing in the Ears. Not Present- Earache, Hoarseness, Nose Bleed, Oral Ulcers, Seasonal Allergies, Sinus Pain, Sore Throat, Visual Disturbances, Wears glasses/contact lenses and Yellow Eyes. Respiratory Not Present- Bloody sputum, Chronic Cough, Difficulty Breathing, Snoring and Wheezing. Breast Not Present- Breast Mass, Breast Pain, Nipple Discharge and Skin Changes. Cardiovascular Present- Chest Pain. Not Present- Difficulty Breathing Lying Down, Leg Cramps, Palpitations, Rapid  Heart Rate, Shortness of Breath and Swelling of Extremities. Gastrointestinal Present- Abdominal Pain, Indigestion, Nausea and Vomiting. Not Present- Bloating, Bloody Stool, Change in Bowel Habits, Chronic diarrhea, Constipation, Difficulty Swallowing, Excessive gas, Gets full quickly at meals, Hemorrhoids and Rectal Pain. Female Genitourinary Not Present- Frequency, Nocturia, Painful Urination, Pelvic Pain and Urgency. Musculoskeletal Present- Back Pain. Not Present- Joint Pain, Joint Stiffness, Muscle Pain, Muscle Weakness and Swelling of Extremities. Neurological Present- Weakness. Not Present- Decreased Memory, Fainting, Headaches, Numbness, Seizures, Tingling, Tremor and Trouble walking. Psychiatric Present- Anxiety. Not Present- Bipolar, Change in Sleep Pattern, Depression, Fearful and Frequent crying. Endocrine Not Present- Cold Intolerance, Excessive Hunger, Hair Changes, Heat Intolerance, Hot flashes and New Diabetes. Hematology Not Present- Blood Thinners, Easy Bruising, Excessive bleeding, Gland problems, HIV and Persistent Infections.  Vitals (Janette Ranson CMA; 08/01/2016 10:14 AM) 08/01/2016 10:14 AM Weight: 94 lb Height: 62in Body Surface Area: 1.39 m Body Mass Index: 17.19 kg/m  Temp.: 44F  Pulse: 99 (Regular)  BP: 100/78 (Sitting, Left Arm, Standard)      Physical Exam Rodman Key K. Kindle Strohmeier MD; 08/01/2016 1:02 PM)  The physical exam findings are as follows: Note:WDWN in NAD Eyes: Pupils equal, round; sclera anicteric HENT: Oral mucosa moist; good dentition Neck: No masses palpated, no thyromegaly Lungs: CTA bilaterally; normal respiratory effort CV: Regular rate and rhythm; no murmurs; extremities well-perfused with no edema Abd: +bowel sounds, soft, minimal epigastric/ RUQ tenderness; no sign of hernia or hepatosplenomegaly Skin: Warm, dry; no sign of jaundice Psychiatric - alert and oriented x 4; calm mood and affect    Assessment & Plan Rodman Key K.  Symon Norwood MD; 08/01/2016 10:40 AM)  CHRONIC CHOLECYSTITIS WITH CALCULUS (K80.10)  NAUSEA AND/OR VOMITING (R11.2)  Current Plans Schedule for Surgery - Recommend laparoscopic cholecystectomy with intraoperative cholangiogram. The surgical procedure has been discussed with the patient. Potential risks, benefits, alternative treatments, and expected outcomes have been explained. All of the patient's questions at this time have been answered. The likelihood of reaching the patient's treatment goal is good. The patient understand the proposed surgical procedure and wishes to proceed.   We will await the results  of the EGD/ colonscopy before scheduling the surgery.  Started Promethazine HCl 12.5MG, 1 (one) Tablet four times daily, as needed, #30, 08/01/2016, No Refill.  Colleen Davis. Georgette Dover, MD, [facility]  General/ Trauma Surgery  08/01/2016 1:07 PM

## 2016-08-03 ENCOUNTER — Emergency Department (HOSPITAL_COMMUNITY)
Admission: EM | Admit: 2016-08-03 | Discharge: 2016-08-04 | Disposition: A | Discharge status: Home / Self Care | Payer: Medicare Other | Attending: Emergency Medicine | Admitting: Emergency Medicine

## 2016-08-03 ENCOUNTER — Encounter (HOSPITAL_COMMUNITY): Payer: Self-pay | Admitting: Vascular Surgery

## 2016-08-03 DIAGNOSIS — I10 Essential (primary) hypertension: Secondary | ICD-10-CM | POA: Diagnosis not present

## 2016-08-03 DIAGNOSIS — R112 Nausea with vomiting, unspecified: Secondary | ICD-10-CM | POA: Insufficient documentation

## 2016-08-03 DIAGNOSIS — E876 Hypokalemia: Secondary | ICD-10-CM

## 2016-08-03 DIAGNOSIS — Z79899 Other long term (current) drug therapy: Secondary | ICD-10-CM | POA: Diagnosis not present

## 2016-08-03 LAB — CBC
HCT: 41.8 % (ref 36.0–46.0)
Hemoglobin: 14.1 g/dL (ref 12.0–15.0)
MCH: 31.2 pg (ref 26.0–34.0)
MCHC: 33.7 g/dL (ref 30.0–36.0)
MCV: 92.5 fL (ref 78.0–100.0)
PLATELETS: 282 10*3/uL (ref 150–400)
RBC: 4.52 MIL/uL (ref 3.87–5.11)
RDW: 13.6 % (ref 11.5–15.5)
WBC: 9 10*3/uL (ref 4.0–10.5)

## 2016-08-03 LAB — COMPREHENSIVE METABOLIC PANEL
ALK PHOS: 100 U/L (ref 38–126)
ALT: 11 U/L — AB (ref 14–54)
AST: 21 U/L (ref 15–41)
Albumin: 4.3 g/dL (ref 3.5–5.0)
Anion gap: 20 — ABNORMAL HIGH (ref 5–15)
BUN: 16 mg/dL (ref 6–20)
CALCIUM: 9.5 mg/dL (ref 8.9–10.3)
CHLORIDE: 100 mmol/L — AB (ref 101–111)
CO2: 22 mmol/L (ref 22–32)
CREATININE: 1.22 mg/dL — AB (ref 0.44–1.00)
GFR calc Af Amer: 51 mL/min — ABNORMAL LOW (ref >60)
GFR, EST NON AFRICAN AMERICAN: 44 mL/min — AB (ref >60)
Glucose, Bld: 125 mg/dL — ABNORMAL HIGH (ref 65–99)
Potassium: 2.8 mmol/L — ABNORMAL LOW (ref 3.5–5.1)
SODIUM: 142 mmol/L (ref 135–145)
Total Bilirubin: 1.5 mg/dL — ABNORMAL HIGH (ref 0.3–1.2)
Total Protein: 7.1 g/dL (ref 6.5–8.1)

## 2016-08-03 LAB — LIPASE, BLOOD: LIPASE: 17 U/L (ref 11–51)

## 2016-08-03 LAB — URINALYSIS, ROUTINE W REFLEX MICROSCOPIC
Glucose, UA: NEGATIVE mg/dL
Hgb urine dipstick: NEGATIVE
KETONES UR: 80 mg/dL — AB
Nitrite: NEGATIVE
PH: 6 (ref 5.0–8.0)
Protein, ur: 100 mg/dL — AB
SPECIFIC GRAVITY, URINE: 1.024 (ref 1.005–1.030)

## 2016-08-03 MED ORDER — ONDANSETRON 4 MG PO TBDP
ORAL_TABLET | ORAL | Status: AC
  Filled 2016-08-03: qty 1

## 2016-08-03 MED ORDER — ONDANSETRON 4 MG PO TBDP: 4.0000 mg | ORAL_TABLET | Freq: Once | ORAL | Status: DC | PRN

## 2016-08-03 NOTE — ED Triage Notes (Signed)
Pt reports to the ED for eval of N/V. She states that she is supposed to have colonoscopy tomorrow and she is supposed to be drinking the prep but she states that she vomited all of the prep up. She also endorses generalized weakness as she has not been able to eat much over the past 3 weeks bc she has gall stones, however they will not do her cholecystectomy until she has had her colonoscopy. Denies abd pain at this time. Denies any diarrhea.

## 2016-08-04 DIAGNOSIS — R112 Nausea with vomiting, unspecified: Secondary | ICD-10-CM | POA: Diagnosis not present

## 2016-08-04 MED ORDER — SODIUM CHLORIDE 0.9 % IV SOLN
30.0000 meq | Freq: Once | INTRAVENOUS | Status: AC
  Administered 2016-08-04: 30 meq via INTRAVENOUS
  Filled 2016-08-04: qty 15

## 2016-08-04 MED ORDER — ONDANSETRON HCL 4 MG/2ML IJ SOLN
4.0000 mg | Freq: Once | INTRAMUSCULAR | Status: AC
  Administered 2016-08-04: 4 mg via INTRAVENOUS
  Filled 2016-08-04: qty 2

## 2016-08-04 MED ORDER — MAGNESIUM SULFATE 2 GM/50ML IV SOLN
2.0000 g | Freq: Once | INTRAVENOUS | Status: AC
  Administered 2016-08-04: 2 g via INTRAVENOUS
  Filled 2016-08-04: qty 50

## 2016-08-04 MED ORDER — METOCLOPRAMIDE HCL 5 MG/ML IJ SOLN
5.0000 mg | Freq: Once | INTRAMUSCULAR | Status: AC
  Administered 2016-08-04: 5 mg via INTRAVENOUS
  Filled 2016-08-04: qty 2

## 2016-08-04 MED ORDER — POTASSIUM CHLORIDE CRYS ER 20 MEQ PO TBCR
40.0000 meq | EXTENDED_RELEASE_TABLET | Freq: Once | ORAL | Status: DC
Start: 2016-08-04 — End: 2016-08-04
  Filled 2016-08-04: qty 2

## 2016-08-04 MED ORDER — SODIUM CHLORIDE 0.9 % IV BOLUS (SEPSIS)
1000.0000 mL | Freq: Once | INTRAVENOUS | Status: AC
  Administered 2016-08-04: 1000 mL via INTRAVENOUS

## 2016-08-04 NOTE — Discharge Instructions (Signed)
Please go for your colonoscopy today. Take your oral potassium at home as prescribed. Return to the Ed if your symptoms worsen.

## 2016-08-04 NOTE — ED Notes (Signed)
ED Provider at bedside. 

## 2016-08-04 NOTE — ED Provider Notes (Signed)
[facility]-EMERGENCY DEPT Provider Note   CSN: [id number] Arrival date & time: 08/03/16  2056     History   Chief Complaint Chief Complaint  Patient presents with  . Emesis    HPI Colleen Davis is a 71 y.o. female.  HPI 71 year old Caucasian female past medical history significant for hypokalemia, hypertension, hyperlipidemia, anxiety presents to the ED today with complaints of nausea and vomiting. Daughter is at bedside. Patient has been having continued nausea and vomiting for the past 3 weeks. Patient has complex of generalized weakness and not being able to eat much for the past 3 weeks due to her gallstones. Patient was seen in the ED for same. Patient had CT scan of abdomen on 07/15/2016 for same. At that time patient had submucosal fatty deposit in the descending colon and recommend colonoscopy. She also had cholelithiasis with gallbladder distention. Patient has been seen by surgery who recommend cholecystectomy and colonoscopy. Patient had colonoscopy scheduled for today at 3PM. They will not to her cholecystectomy until she has a colonoscopy. Tonight patient was doing her bowel prep when she drank the liquid and then had emesis. States that she vomited all the prep up. She denies any abdominal pain, diarrhea. She denies any fever, chills, headache, vision changes, lightheadedness, dizziness, chest pain, short of breath. She reports nausea at this time which is baseline for patient. Past Medical History:  Diagnosis Date  . Anxiety   . Hyperlipidemia   . Hypertension   . Hypokalemia     Patient Active Problem List   Diagnosis Date Noted  . Syncope and collapse 11/17/2011  . HTN (hypertension) 11/17/2011  . Tinnitus of right ear 11/17/2011  . Hyperlipidemia 11/17/2011    History reviewed. No pertinent surgical history.  OB History    No data available       Home Medications    Prior to Admission medications   Medication Sig Start Date End Date Taking? Authorizing  Provider  ALPRAZolam (XANAX) 0.25 MG tablet Take 0.25 mg by mouth daily.   Yes Historical Provider, MD  atorvastatin (LIPITOR) 20 MG tablet Take 20 mg by mouth daily at 6 PM.   Yes Historical Provider, MD  lisinopril-hydrochlorothiazide (PRINZIDE,ZESTORETIC) 20-25 MG per tablet Take 1 tablet by mouth daily.   Yes Historical Provider, MD  potassium chloride (K-DUR) 10 MEQ tablet Take 1 tablet (10 mEq total) by mouth daily. 06/22/16  Yes Audry Pili, PA-C  ondansetron (ZOFRAN ODT) 8 MG disintegrating tablet Take 1 tablet (8 mg total) by mouth every 8 (eight) hours as needed for nausea or vomiting. Patient not taking: Reported on 08/04/2016 07/15/16   Azalia Bilis, MD    Family History No family history on file.  Social History Social History  Substance Use Topics  . Smoking status: Never Smoker  . Smokeless tobacco: Never Used  . Alcohol use No     Allergies   Patient has no known allergies.   Review of Systems Review of Systems  Constitutional: Negative for chills and fever.  HENT: Negative for congestion.   Eyes: Negative for visual disturbance.  Respiratory: Negative for cough and shortness of breath.   Cardiovascular: Negative for chest pain.  Gastrointestinal: Positive for nausea and vomiting. Negative for abdominal pain, constipation and diarrhea.  Genitourinary: Negative for dysuria, flank pain, frequency, hematuria and urgency.  Musculoskeletal: Negative for back pain.  Skin: Negative for color change.  Neurological: Negative for dizziness, syncope, weakness, light-headedness and numbness.     Physical Exam Updated Vital  Signs BP 107/70   Pulse 60   Temp 97.8 F (36.6 C) (Oral)   Resp 13   SpO2 98%   Physical Exam  Constitutional: She is oriented to person, place, and time. She appears well-developed and well-nourished. No distress.  Frail elderly woman sitting in the bed in no acute distress. Appears to be chronically sick.  HENT:  Head: Normocephalic and  atraumatic.  Mouth/Throat: Oropharynx is clear and moist.  Eyes: Conjunctivae and EOM are normal. Pupils are equal, round, and reactive to light. Right eye exhibits no discharge. Left eye exhibits no discharge. No scleral icterus.  Neck: Normal range of motion. Neck supple. No thyromegaly present.  Cardiovascular: Normal rate, regular rhythm, normal heart sounds and intact distal pulses.  Exam reveals no gallop and no friction rub.   No murmur heard. Pulmonary/Chest: Effort normal and breath sounds normal. No respiratory distress. She has no wheezes. She has no rales.  Abdominal: Soft. Bowel sounds are normal. She exhibits no distension. There is no tenderness. There is no rigidity, no rebound, no guarding and no CVA tenderness.  Musculoskeletal: Normal range of motion.  Lymphadenopathy:    She has no cervical adenopathy.  Neurological: She is alert and oriented to person, place, and time.  Skin: Skin is warm and dry. Capillary refill takes less than 2 seconds.  Pallor noted.  Nursing note and vitals reviewed.    ED Treatments / Results  Labs (all labs ordered are listed, but only abnormal results are displayed) Labs Reviewed  COMPREHENSIVE METABOLIC PANEL - Abnormal; Notable for the following:       Result Value   Potassium 2.8 (*)    Chloride 100 (*)    Glucose, Bld 125 (*)    Creatinine, Ser 1.22 (*)    ALT 11 (*)    Total Bilirubin 1.5 (*)    GFR calc non Af Amer 44 (*)    GFR calc Af Amer 51 (*)    Anion gap 20 (*)    All other components within normal limits  URINALYSIS, ROUTINE W REFLEX MICROSCOPIC - Abnormal; Notable for the following:    Bilirubin Urine SMALL (*)    Ketones, ur 80 (*)    Protein, ur 100 (*)    Leukocytes, UA TRACE (*)    Bacteria, UA RARE (*)    Squamous Epithelial / LPF 0-5 (*)    All other components within normal limits  URINE CULTURE  LIPASE, BLOOD  CBC    EKG  EKG Interpretation None       Radiology No results  found.  Procedures Procedures (including critical care time)  Medications Ordered in ED Medications  ondansetron (ZOFRAN-ODT) disintegrating tablet 4 mg (4 mg Oral Refused 08/03/16 2137)  potassium chloride SA (K-DUR,KLOR-CON) CR tablet 40 mEq (40 mEq Oral Refused 08/04/16 0307)  magnesium sulfate IVPB 2 g 50 mL (2 g Intravenous New Bag/Given 08/04/16 0528)  sodium chloride 0.9 % bolus 1,000 mL (0 mLs Intravenous Stopped 08/04/16 0230)  potassium chloride 30 mEq in sodium chloride 0.9 % 265 mL (KCL MULTIRUN) IVPB (0 mEq Intravenous Stopped 08/04/16 0443)  ondansetron (ZOFRAN) injection 4 mg (4 mg Intravenous Given 08/04/16 0126)  metoCLOPramide (REGLAN) injection 5 mg (5 mg Intravenous Given 08/04/16 0125)     Initial Impression / Assessment and Plan / ED Course  I have reviewed the triage vital signs and the nursing notes.  Pertinent labs & imaging results that were available during my care of the patient were reviewed  by me and considered in my medical decision making (see chart for details).    Patient presents to the ED with complaints of nausea and emesis after taking her bowel prep for colonoscopy today. Patient with baseline nausea and emesis for the past 3 weeks. Has been seen before. Patient is seen by surgery who has a scheduled colonoscopy and cholecystectomy. Patient's vital signs are stable. No tachycardia. No hypotension. Mild signs of dehydration. UA with ketones, protein, trace leukocytes, rare bacteria, squamous epithelium. Patient denies any urinary symptoms. Likely asymptomatic bacteriuria. Does show signs of dehydration. IV fluid bolus given along with Zofran and Reglan. Leukocytosis noted. Potassium was noted to be 2.8. Patient with history of hypokalemia in this best be taken oral potassium at home and has not been taken due to nausea and emesis. Potassium replaced with IV infusion. EKG with U wave and prolonged QT. Magnesium was also replaced. Repeat EKG shows improved QT less  than 500 and improved U waves. Mild increase in creatinine however seems to be at patient's baseline. Likely due to dehydration. Patient has colonoscopy this afternoon. Encouraged her to still make her appointment. Nothing by mouth. Did not feel that imaging is indicated at this time as nausea and emesis has been ongoing for the past 2 weeks and has seen surgery for the same with cholecystectomy and colonoscopy ordered. Patient likely had emesis due to bowel prep this evening. Patient is hemodynamically stable in no acute distress. No signs of infection. Does not meet Sirs or sepsis criteria. We'll be discharged and given strict return precautions. Pt and daughter verbalize understanding the plan of care. Patient was discussed with Dr. Mora Bellman who is agreeable to the above plan.   Final Clinical Impressions(s) / ED Diagnoses   Final diagnoses:  Non-intractable vomiting with nausea, unspecified vomiting type  Hypokalemia    New Prescriptions New Prescriptions   No medications on file     Rise Mu, PA-C 08/04/16 5462    Tomasita Crumble, MD 08/04/16 [phone]

## 2016-08-06 ENCOUNTER — Encounter (HOSPITAL_COMMUNITY): Payer: Self-pay

## 2016-08-06 ENCOUNTER — Emergency Department (HOSPITAL_COMMUNITY)
Admission: EM | Admit: 2016-08-06 | Discharge: 2016-08-06 | Disposition: A | Discharge status: Home / Self Care | Payer: Medicare Other | Attending: Emergency Medicine | Admitting: Emergency Medicine

## 2016-08-06 DIAGNOSIS — Z79899 Other long term (current) drug therapy: Secondary | ICD-10-CM | POA: Diagnosis not present

## 2016-08-06 DIAGNOSIS — R109 Unspecified abdominal pain: Secondary | ICD-10-CM | POA: Diagnosis present

## 2016-08-06 DIAGNOSIS — K8020 Calculus of gallbladder without cholecystitis without obstruction: Secondary | ICD-10-CM | POA: Diagnosis not present

## 2016-08-06 DIAGNOSIS — E876 Hypokalemia: Secondary | ICD-10-CM

## 2016-08-06 DIAGNOSIS — I10 Essential (primary) hypertension: Secondary | ICD-10-CM | POA: Insufficient documentation

## 2016-08-06 LAB — COMPREHENSIVE METABOLIC PANEL
ALBUMIN: 4.1 g/dL (ref 3.5–5.0)
ALK PHOS: 94 U/L (ref 38–126)
ALT: 9 U/L — ABNORMAL LOW (ref 14–54)
AST: 17 U/L (ref 15–41)
Anion gap: 17 — ABNORMAL HIGH (ref 5–15)
BUN: 9 mg/dL (ref 6–20)
CALCIUM: 9.1 mg/dL (ref 8.9–10.3)
CO2: 23 mmol/L (ref 22–32)
Chloride: 98 mmol/L — ABNORMAL LOW (ref 101–111)
Creatinine, Ser: 0.94 mg/dL (ref 0.44–1.00)
GFR calc Af Amer: >60 mL/min (ref >60)
GFR calc non Af Amer: >60 mL/min (ref >60)
GLUCOSE: 106 mg/dL — AB (ref 65–99)
POTASSIUM: 2.5 mmol/L — AB (ref 3.5–5.1)
SODIUM: 138 mmol/L (ref 135–145)
Total Bilirubin: 1.2 mg/dL (ref 0.3–1.2)
Total Protein: 6.5 g/dL (ref 6.5–8.1)

## 2016-08-06 LAB — CBC
HEMATOCRIT: 40 % (ref 36.0–46.0)
HEMOGLOBIN: 13.7 g/dL (ref 12.0–15.0)
MCH: 31.5 pg (ref 26.0–34.0)
MCHC: 34.3 g/dL (ref 30.0–36.0)
MCV: 92 fL (ref 78.0–100.0)
Platelets: 216 10*3/uL (ref 150–400)
RBC: 4.35 MIL/uL (ref 3.87–5.11)
RDW: 13.5 % (ref 11.5–15.5)
WBC: 5.4 10*3/uL (ref 4.0–10.5)

## 2016-08-06 LAB — I-STAT TROPONIN, ED: Troponin i, poc: 0.01 ng/mL (ref 0.00–0.08)

## 2016-08-06 LAB — LIPASE, BLOOD: Lipase: 35 U/L (ref 11–51)

## 2016-08-06 MED ORDER — GI COCKTAIL ~~LOC~~
30.0000 mL | Freq: Once | ORAL | Status: AC
  Administered 2016-08-06: 30 mL via ORAL
  Filled 2016-08-06: qty 30

## 2016-08-06 MED ORDER — POTASSIUM CHLORIDE 20 MEQ/15ML (10%) PO SOLN
40.0000 meq | Freq: Every day | ORAL | Status: DC
  Administered 2016-08-06: 40 meq via ORAL
  Filled 2016-08-06: qty 30

## 2016-08-06 MED ORDER — HYDROCODONE-ACETAMINOPHEN 5-325 MG PO TABS: 1.0000 | ORAL_TABLET | Freq: Four times a day (QID) | ORAL | 0 refills | Status: DC | PRN

## 2016-08-06 MED ORDER — SODIUM CHLORIDE 0.9 % IV BOLUS (SEPSIS)
1000.0000 mL | Freq: Once | INTRAVENOUS | Status: AC
  Administered 2016-08-06: 1000 mL via INTRAVENOUS

## 2016-08-06 MED ORDER — ONDANSETRON HCL 4 MG PO TABS: 4.0000 mg | ORAL_TABLET | Freq: Four times a day (QID) | ORAL | 0 refills | Status: DC

## 2016-08-06 NOTE — ED Notes (Signed)
  PIV placed and PO meds given.  Patient was placed on IV pump due to bending of arm.  Warm blanket given.  Son at bedside.

## 2016-08-06 NOTE — ED Provider Notes (Signed)
[facility] DEPT Provider Note   CSN: [id number] Arrival date & time: 08/06/16  1641     History   Chief Complaint Chief Complaint  Patient presents with  . abd. pain/gall bladder problems    HPI Colleen Davis is a 71 y.o. female.  The history is provided by the patient, a relative and medical records.  GI Problem  This is a recurrent problem. The current episode started more than 1 week ago. The problem has not changed since onset.Associated symptoms include abdominal pain. Pertinent negatives include no chest pain and no shortness of breath. Nothing aggravates the symptoms. Nothing relieves the symptoms. She has tried nothing for the symptoms.    Past Medical History:  Diagnosis Date  . Anxiety   . Hyperlipidemia   . Hypertension   . Hypokalemia     Patient Active Problem List   Diagnosis Date Noted  . Syncope and collapse 11/17/2011  . HTN (hypertension) 11/17/2011  . Tinnitus of right ear 11/17/2011  . Hyperlipidemia 11/17/2011    History reviewed. No pertinent surgical history.  OB History    No data available       Home Medications    Prior to Admission medications   Medication Sig Start Date End Date Taking? Authorizing Provider  ALPRAZolam (XANAX) 0.25 MG tablet Take 0.25 mg by mouth daily.    Historical Provider, MD  atorvastatin (LIPITOR) 20 MG tablet Take 20 mg by mouth daily at 6 PM.    Historical Provider, MD  lisinopril-hydrochlorothiazide (PRINZIDE,ZESTORETIC) 20-25 MG per tablet Take 1 tablet by mouth daily.    Historical Provider, MD  ondansetron (ZOFRAN ODT) 8 MG disintegrating tablet Take 1 tablet (8 mg total) by mouth every 8 (eight) hours as needed for nausea or vomiting. Patient not taking: Reported on 08/04/2016 07/15/16   Azalia Bilis, MD  potassium chloride (K-DUR) 10 MEQ tablet Take 1 tablet (10 mEq total) by mouth daily. 06/22/16   Audry Pili, PA-C    Family History No family history on file.  Social History Social History    Substance Use Topics  . Smoking status: Never Smoker  . Smokeless tobacco: Never Used  . Alcohol use No     Allergies   Patient has no known allergies.   Review of Systems Review of Systems  Constitutional: Positive for appetite change. Negative for chills and fever.  HENT: Negative for ear pain and sore throat.   Eyes: Negative for pain and visual disturbance.  Respiratory: Negative for cough and shortness of breath.   Cardiovascular: Negative for chest pain and palpitations.  Gastrointestinal: Positive for abdominal pain and nausea. Negative for vomiting.  Genitourinary: Negative for dysuria and hematuria.  Musculoskeletal: Negative for arthralgias and back pain.  Skin: Negative for color change and rash.  Neurological: Negative for seizures and syncope.  All other systems reviewed and are negative.   Physical Exam Updated Vital Signs BP (!) 147/93 (BP Location: Right Arm)   Pulse 77   Temp 98.6 F (37 C) (Oral)   Resp 16   SpO2 100%   Physical Exam  Constitutional: She appears well-developed and well-nourished. No distress.  Ill-appearing elderly woman  HENT:  Head: Normocephalic and atraumatic.  Eyes: Conjunctivae are normal.  Neck: Neck supple.  Cardiovascular: Normal rate and regular rhythm.   No murmur heard. Pulmonary/Chest: Effort normal and breath sounds normal. No respiratory distress.  Abdominal: Soft. There is tenderness (mild diffuse, worse in RUQ).  No Murphy's sign, no peritoneal signs  Musculoskeletal:  She exhibits no edema.  Neurological: She is alert.  Skin: Skin is warm and dry.  Psychiatric: She has a normal mood and affect.  Nursing note and vitals reviewed.  ED Treatments / Results  Labs (all labs ordered are listed, but only abnormal results are displayed) Labs Reviewed  CBC  LIPASE, BLOOD  COMPREHENSIVE METABOLIC PANEL  I-STAT TROPOININ, ED    EKG  EKG Interpretation None       Radiology No results  found.  Procedures Procedures (including critical care time)  Medications Ordered in ED Medications  sodium chloride 0.9 % bolus 1,000 mL (not administered)  gi cocktail (Maalox,Lidocaine,Donnatal) (not administered)     Initial Impression / Assessment and Plan / ED Course  I have reviewed the triage vital signs and the nursing notes.  Pertinent labs & imaging results that were available during my care of the patient were reviewed by me and considered in my medical decision making (see chart for details).    Pt with h/o hypoK, HTN, HLD, known symptomatic cholelithiasis presents with abdominal pain, N/V. Says she been having the symptoms for the last 3wks & they have not improved/worsened. She has seen a surgeon re: cholecystectomy, but was told that she needs a colonoscopy before he'll do the operation; she tried to start the prep on 4/18, but couldn't keep it down, so her cholecystectomy is currently on hold. Endorses mild abdominal pain, N/V, anorexia; denies F/C, lightheadedness, cough, CP, SOB, urinary symptoms.  VS & exam as above. EKG: NSR @ 86bpm w/TWI in II, III, aVF & V3. Labs remarkable for K 2.5, troponin 0.01; K replenished PO. GI cocktail & NS bolus given for symptoms.  POCUS RUQ scan w/large stone, but no pericholecystic fluid, wall thickening, or songraphic Murphy's to suggest acute cholecystitis.  On re-evaluation, Pt feeling better & able to tolerate PO in the ED.  Explained all results to the Pt & son. Will discharge the Pt home with prescription for Norco & Zofran. Recommending follow-up with PCP & her surgeon for re-evaluation based on continued symptoms. ED return precautions provided. Pt acknowledged understanding of, and concurrence with the plan. All questions answered to her satisfaction. In stable condition at the time of discharge.  Final Clinical Impressions(s) / ED Diagnoses   Final diagnoses:  Symptomatic cholelithiasis  Hypokalemia    New  Prescriptions Discharge Medication List as of 08/06/2016  9:57 PM    START taking these medications   Details  HYDROcodone-acetaminophen (NORCO/VICODIN) 5-325 MG tablet Take 1 tablet by mouth every 6 (six) hours as needed for severe pain., Starting Sat 08/06/2016, Until Wed 08/10/2016, Print    ondansetron (ZOFRAN) 4 MG tablet Take 1 tablet (4 mg total) by mouth every 6 (six) hours., Starting Sat 08/06/2016, Print         Forest Becker, MD 08/07/16 1610    Blane Ohara, MD 08/07/16 [phone]

## 2016-08-06 NOTE — ED Triage Notes (Signed)
Patient reports that she has gallbladder disease and has seen surgeon for same. Developed epigastric pain last night with nausea and reports some left sided CP with same. NAD

## 2016-08-09 ENCOUNTER — Ambulatory Visit: Payer: Self-pay | Admitting: Surgery

## 2016-08-12 NOTE — Pre-Procedure Instructions (Signed)
    Colleen Davis  08/12/2016      [facility] [place], [place] - [address] [place] [address] Phone: [phone] Fax: [phone]    Your procedure is scheduled on 08/17/16.  Report to [facility] Admitting at 630 A.M.  Call this number if you have problems the morning of surgery:  [phone]   Remember:  Do not eat food or drink liquids after midnight.  Take these medicines the morning of surgery with A SIP OF WATER --xanax   Do not wear jewelry, make-up or nail polish.  Do not wear lotions, powders, or perfumes, or deoderant.  Do not shave 48 hours prior to surgery.  Men may shave face and neck.  Do not bring valuables to the hospital.  [facility] is not responsible for any belongings or valuables.  Contacts, dentures or bridgework may not be worn into surgery.  Leave your suitcase in the car.  After surgery it may be brought to your room.  For patients admitted to the hospital, discharge time will be determined by your treatment team.  Patients discharged the day of surgery will not be allowed to drive home.   Name and phone number of your driver:    Special instructions: Do not take any aspirin,anti-inflammatories,vitamins,or herbal supplements 5-7 days prior to surgery.  Please read over the following fact sheets that you were given.

## 2016-08-13 ENCOUNTER — Encounter (HOSPITAL_COMMUNITY): Payer: Self-pay

## 2016-08-13 ENCOUNTER — Emergency Department (HOSPITAL_COMMUNITY): Payer: Medicare Other

## 2016-08-13 ENCOUNTER — Emergency Department (HOSPITAL_COMMUNITY)
Admission: EM | Admit: 2016-08-13 | Discharge: 2016-08-13 | Disposition: A | Discharge status: Home / Self Care | Payer: Medicare Other | Attending: Emergency Medicine | Admitting: Emergency Medicine

## 2016-08-13 DIAGNOSIS — R1110 Vomiting, unspecified: Secondary | ICD-10-CM | POA: Diagnosis present

## 2016-08-13 DIAGNOSIS — I10 Essential (primary) hypertension: Secondary | ICD-10-CM | POA: Diagnosis not present

## 2016-08-13 DIAGNOSIS — E876 Hypokalemia: Secondary | ICD-10-CM | POA: Insufficient documentation

## 2016-08-13 LAB — COMPREHENSIVE METABOLIC PANEL
ALT: 12 U/L — ABNORMAL LOW (ref 14–54)
ANION GAP: 16 — AB (ref 5–15)
AST: 23 U/L (ref 15–41)
Albumin: 4.1 g/dL (ref 3.5–5.0)
Alkaline Phosphatase: 80 U/L (ref 38–126)
BILIRUBIN TOTAL: 2 mg/dL — AB (ref 0.3–1.2)
BUN: 18 mg/dL (ref 6–20)
CO2: 23 mmol/L (ref 22–32)
Calcium: 9.2 mg/dL (ref 8.9–10.3)
Chloride: 98 mmol/L — ABNORMAL LOW (ref 101–111)
Creatinine, Ser: 0.88 mg/dL (ref 0.44–1.00)
GFR calc Af Amer: >60 mL/min (ref >60)
Glucose, Bld: 125 mg/dL — ABNORMAL HIGH (ref 65–99)
POTASSIUM: 2.8 mmol/L — AB (ref 3.5–5.1)
Sodium: 137 mmol/L (ref 135–145)
TOTAL PROTEIN: 6.9 g/dL (ref 6.5–8.1)

## 2016-08-13 LAB — CBC WITH DIFFERENTIAL/PLATELET
BASOS ABS: 0 10*3/uL (ref 0.0–0.1)
Basophils Relative: 0 %
EOS PCT: 0 %
Eosinophils Absolute: 0 10*3/uL (ref 0.0–0.7)
HCT: 36.5 % (ref 36.0–46.0)
Hemoglobin: 12.9 g/dL (ref 12.0–15.0)
Lymphocytes Relative: 5 %
Lymphs Abs: 0.4 10*3/uL — ABNORMAL LOW (ref 0.7–4.0)
MCH: 31.9 pg (ref 26.0–34.0)
MCHC: 35.3 g/dL (ref 30.0–36.0)
MCV: 90.3 fL (ref 78.0–100.0)
MONO ABS: 0.5 10*3/uL (ref 0.1–1.0)
Monocytes Relative: 6 %
NEUTROS ABS: 7.6 10*3/uL (ref 1.7–7.7)
Neutrophils Relative %: 89 %
PLATELETS: 206 10*3/uL (ref 150–400)
RBC: 4.04 MIL/uL (ref 3.87–5.11)
RDW: 13.4 % (ref 11.5–15.5)
WBC: 8.5 10*3/uL (ref 4.0–10.5)

## 2016-08-13 LAB — URINALYSIS, ROUTINE W REFLEX MICROSCOPIC
Glucose, UA: NEGATIVE mg/dL
HGB URINE DIPSTICK: NEGATIVE
KETONES UR: 80 mg/dL — AB
NITRITE: NEGATIVE
PH: 6 (ref 5.0–8.0)
PROTEIN: 30 mg/dL — AB
SPECIFIC GRAVITY, URINE: 1.02 (ref 1.005–1.030)

## 2016-08-13 LAB — LIPASE, BLOOD: LIPASE: 30 U/L (ref 11–51)

## 2016-08-13 MED ORDER — SODIUM CHLORIDE 0.9 % IV SOLN
INTRAVENOUS | Status: DC
  Administered 2016-08-13: 08:00:00 via INTRAVENOUS

## 2016-08-13 MED ORDER — ONDANSETRON HCL 4 MG/2ML IJ SOLN
4.0000 mg | Freq: Once | INTRAMUSCULAR | Status: AC
  Administered 2016-08-13: 4 mg via INTRAVENOUS
  Filled 2016-08-13: qty 2

## 2016-08-13 MED ORDER — POTASSIUM CHLORIDE 10 MEQ/100ML IV SOLN
10.0000 meq | INTRAVENOUS | Status: AC
  Administered 2016-08-13 (×3): 10 meq via INTRAVENOUS
  Filled 2016-08-13 (×3): qty 100

## 2016-08-13 MED ORDER — SODIUM CHLORIDE 0.9 % IV SOLN: 30.0000 meq | Freq: Once | INTRAVENOUS | Status: DC

## 2016-08-13 NOTE — ED Notes (Signed)
Pt provided with 2 warm blankets.  

## 2016-08-13 NOTE — ED Provider Notes (Signed)
[facility]-EMERGENCY DEPT Provider Note   CSN: [id number] Arrival date & time: 08/13/16  0749     History   Chief Complaint Chief Complaint  Patient presents with  . Emesis    HPI Colleen Davis is a 71 y.o. female.  71 year old female with known history of cholelithiasis presents with worsening weakness as well as nonbilious emesis. Her weakness has been nonfocal. Denies any fever or chills. Scheduled for cholecystectomy in 4 days. Has been using Phenergan at home without relief and she has been experiencing symptoms of dizziness and feeling weird since taking Phenergan. She has tried Zofran in the past she said which did not help much.Her urine has been normal color and denies any flank pain. See is unsure of what exacerbates her emesis.      Past Medical History:  Diagnosis Date  . Anxiety   . Hyperlipidemia   . Hypertension   . Hypokalemia     Patient Active Problem List   Diagnosis Date Noted  . Syncope and collapse 11/17/2011  . HTN (hypertension) 11/17/2011  . Tinnitus of right ear 11/17/2011  . Hyperlipidemia 11/17/2011    No past surgical history on file.  OB History    No data available       Home Medications    Prior to Admission medications   Medication Sig Start Date End Date Taking? Authorizing Provider  ALPRAZolam (XANAX) 0.25 MG tablet Take 0.25 mg by mouth 3 (three) times daily as needed for anxiety.    Yes Historical Provider, MD  atorvastatin (LIPITOR) 20 MG tablet Take 20 mg by mouth daily at 6 PM.   Yes Historical Provider, MD  lisinopril-hydrochlorothiazide (PRINZIDE,ZESTORETIC) 20-25 MG per tablet Take 1 tablet by mouth daily.   Yes Historical Provider, MD  potassium chloride (K-DUR) 10 MEQ tablet Take 1 tablet (10 mEq total) by mouth daily. 06/22/16  Yes Audry Pili, PA-C  ondansetron (ZOFRAN) 4 MG tablet Take 1 tablet (4 mg total) by mouth every 6 (six) hours. Patient not taking: Reported on 08/13/2016 08/06/16   Forest Becker, MD    Family  History No family history on file.  Social History Social History  Substance Use Topics  . Smoking status: Never Smoker  . Smokeless tobacco: Never Used  . Alcohol use No     Allergies   Patient has no known allergies.   Review of Systems Review of Systems  All other systems reviewed and are negative.    Physical Exam Updated Vital Signs BP 121/86   Pulse 92   Temp 97.4 F (36.3 C) (Oral)   Resp 16   SpO2 100%   Physical Exam  Constitutional: She is oriented to person, place, and time. She appears well-developed and well-nourished.  Non-toxic appearance. No distress.  HENT:  Head: Normocephalic and atraumatic.  Eyes: Conjunctivae, EOM and lids are normal. Pupils are equal, round, and reactive to light.  Neck: Normal range of motion. Neck supple. No tracheal deviation present. No thyroid mass present.  Cardiovascular: Normal rate, regular rhythm and normal heart sounds.  Exam reveals no gallop.   No murmur heard. Pulmonary/Chest: Effort normal and breath sounds normal. No stridor. No respiratory distress. She has no decreased breath sounds. She has no wheezes. She has no rhonchi. She has no rales.  Abdominal: Soft. Normal appearance and bowel sounds are normal. She exhibits no distension. There is tenderness in the right upper quadrant and epigastric area. There is no rebound and no CVA tenderness.    Musculoskeletal:  Normal range of motion. She exhibits no edema or tenderness.  Neurological: She is alert and oriented to person, place, and time. She has normal strength. No cranial nerve deficit or sensory deficit. GCS eye subscore is 4. GCS verbal subscore is 5. GCS motor subscore is 6.  Skin: Skin is warm and dry. No abrasion and no rash noted.  Psychiatric: She has a normal mood and affect. Her speech is normal and behavior is normal.  Nursing note and vitals reviewed.    ED Treatments / Results  Labs (all labs ordered are listed, but only abnormal results are  displayed) Labs Reviewed  CBC WITH DIFFERENTIAL/PLATELET  COMPREHENSIVE METABOLIC PANEL  LIPASE, BLOOD  URINALYSIS, ROUTINE W REFLEX MICROSCOPIC  CBC WITH DIFFERENTIAL/PLATELET    EKG  EKG Interpretation None       Radiology No results found.  Procedures Procedures (including critical care time)  Medications Ordered in ED Medications  0.9 %  sodium chloride infusion ( Intravenous New Bag/Given 08/13/16 0816)  ondansetron (ZOFRAN) injection 4 mg (4 mg Intravenous Given 08/13/16 0816)     Initial Impression / Assessment and Plan / ED Course  I have reviewed the triage vital signs and the nursing notes.  Pertinent labs & imaging results that were available during my care of the patient were reviewed by me and considered in my medical decision making (see chart for details).     Patient given IV fluids as well as IV potassium for her potassium of 2.8. Feels better at this time. No evidence of acute cholecystitis. Will plan for her to see the surgeon on Tuesday for a cholecystectomy  Final Clinical Impressions(s) / ED Diagnoses   Final diagnoses:  None    New Prescriptions New Prescriptions   No medications on file     Lorre Nick, MD 08/13/16 1328

## 2016-08-13 NOTE — ED Notes (Signed)
Patient not able to provide urine sample at this time. 

## 2016-08-13 NOTE — ED Triage Notes (Signed)
She states she has been nauseated and had frequent vomiting for a few days. She had been prescribed anti-nausea medicine which has been working reasonably well until this morning. She states she feels "weird". She retches continually at triage, but produces only what appears to be saliva.

## 2016-08-15 ENCOUNTER — Encounter (HOSPITAL_COMMUNITY): Payer: Self-pay

## 2016-08-15 ENCOUNTER — Encounter (HOSPITAL_COMMUNITY)
Admission: RE | Admit: 2016-08-15 | Discharge: 2016-08-15 | Disposition: A | Discharge status: Home / Self Care | Payer: Medicare Other | Source: Ambulatory Visit | Attending: Surgery | Admitting: Surgery

## 2016-08-15 HISTORY — DX: Major depressive disorder, single episode, unspecified: F32.9

## 2016-08-15 HISTORY — DX: Chronic kidney disease, unspecified: N18.9

## 2016-08-15 HISTORY — DX: Dehydration: E86.0

## 2016-08-15 HISTORY — DX: Depression, unspecified: F32.A

## 2016-08-15 HISTORY — DX: Personal history of urinary calculi: Z87.442

## 2016-08-15 LAB — BASIC METABOLIC PANEL
ANION GAP: 17 — AB (ref 5–15)
BUN: 17 mg/dL (ref 6–20)
CHLORIDE: 100 mmol/L — AB (ref 101–111)
CO2: 22 mmol/L (ref 22–32)
CREATININE: 0.93 mg/dL (ref 0.44–1.00)
Calcium: 9.1 mg/dL (ref 8.9–10.3)
GFR calc non Af Amer: >60 mL/min (ref >60)
Glucose, Bld: 124 mg/dL — ABNORMAL HIGH (ref 65–99)
POTASSIUM: 2.5 mmol/L — AB (ref 3.5–5.1)
SODIUM: 139 mmol/L (ref 135–145)

## 2016-08-15 LAB — CBC
HEMATOCRIT: 39.6 % (ref 36.0–46.0)
HEMOGLOBIN: 13.6 g/dL (ref 12.0–15.0)
MCH: 31.1 pg (ref 26.0–34.0)
MCHC: 34.3 g/dL (ref 30.0–36.0)
MCV: 90.6 fL (ref 78.0–100.0)
Platelets: 285 10*3/uL (ref 150–400)
RBC: 4.37 MIL/uL (ref 3.87–5.11)
RDW: 13.8 % (ref 11.5–15.5)
WBC: 8.5 10*3/uL (ref 4.0–10.5)

## 2016-08-15 MED ORDER — CHLORHEXIDINE GLUCONATE CLOTH 2 % EX PADS: 6.0000 | MEDICATED_PAD | Freq: Once | CUTANEOUS | Status: DC

## 2016-08-15 NOTE — Final Progress Note (Signed)
K 2.5  Dr Corliss Skains office notified. Also Edmonia Caprio notifired.

## 2016-08-15 NOTE — Progress Notes (Signed)
She needs to be on KCl 40 mEq PO BID beginning today through surgery.  If her potassium is too low on the day of surgery, we may not be able to do her case.

## 2016-08-16 ENCOUNTER — Ambulatory Visit (HOSPITAL_COMMUNITY): Payer: Medicare Other | Admitting: Emergency Medicine

## 2016-08-16 DIAGNOSIS — I429 Cardiomyopathy, unspecified: Secondary | ICD-10-CM

## 2016-08-16 HISTORY — DX: Cardiomyopathy, unspecified: I42.9

## 2016-08-16 NOTE — Progress Notes (Signed)
Anesthesia Chart Review:  Pt is a 71 year old female scheduled for laparoscopic cholecystectomy with intraoperative cholangiogram on 08/17/2016 with Manus Rudd, MD.   - PCP is Lupe Carney, MD  PMH includes:  HTN, hyperlipidemia, polycystic kidneys, hypokalemia. Never smoker. BMI 16.  - ED visit 08/13/16 for abdominal pain, hypokalemia (K 2.8) - ED visit 08/06/16 for abdominal pain, hypokalemia (K 2.5) - ED visit 08/03/16 for anorexia, weakness, hypokalemia (K 2.8)  Medications include: Lipitor, lisinopril-HCTZ, potassium.  Preoperative labs reviewed. K is 2.5. Dr. Fatima Sanger office notified. Will recheck DOS.  Acute Abd with CXR 08/13/16: Negative abdominal radiographs. No acute cardiopulmonary disease.  EKG 08/06/16: NSR. ST & T wave abnormality, consider inferior ischemia. ST & T wave abnormality, consider anterolateral ischemia. T wave inversions new (K was 2.5 at this time).  - comparison EKG 08/04/16 does not have T wave inversions (K at this time was 2.8).   Echo 11/17/11:  - Left ventricle: The cavity size was normal. There was mild concentric hypertrophy. Systolic function was normal. The estimated ejection fraction was in the range of 60% to 65%. Wall motion was normal; there were no regional wall motion abnormalities. - Pulmonary arteries: Systolic pressure was mildly increased. PA peak pressure: 33mm Hg (S).  Reviewed case with Dr. Chilton Si. If K acceptable DOS, I anticipate pt can proceed as scheduled.   Rica Mast, FNP-BC [facility]/Anesthesiology Phone: [phone] 08/16/2016 3:31 PM

## 2016-08-17 ENCOUNTER — Encounter (HOSPITAL_COMMUNITY)
Admission: AD | Disposition: A | Discharge status: Home / Self Care | Payer: Self-pay | Source: Ambulatory Visit | Attending: Internal Medicine

## 2016-08-17 ENCOUNTER — Inpatient Hospital Stay (HOSPITAL_COMMUNITY)
Admission: AD | Admit: 2016-08-17 | Discharge: 2016-08-25 | DRG: 640 | Disposition: A | Discharge status: Home / Self Care | Payer: Medicare Other | Source: Ambulatory Visit | Attending: Internal Medicine | Admitting: Internal Medicine

## 2016-08-17 ENCOUNTER — Encounter (HOSPITAL_COMMUNITY): Payer: Self-pay | Admitting: *Deleted

## 2016-08-17 DIAGNOSIS — K219 Gastro-esophageal reflux disease without esophagitis: Secondary | ICD-10-CM | POA: Diagnosis present

## 2016-08-17 DIAGNOSIS — G9340 Encephalopathy, unspecified: Secondary | ICD-10-CM | POA: Diagnosis not present

## 2016-08-17 DIAGNOSIS — K8010 Calculus of gallbladder with chronic cholecystitis without obstruction: Secondary | ICD-10-CM | POA: Diagnosis present

## 2016-08-17 DIAGNOSIS — E512 Wernicke's encephalopathy: Secondary | ICD-10-CM | POA: Diagnosis present

## 2016-08-17 DIAGNOSIS — I130 Hypertensive heart and chronic kidney disease with heart failure and stage 1 through stage 4 chronic kidney disease, or unspecified chronic kidney disease: Secondary | ICD-10-CM | POA: Diagnosis present

## 2016-08-17 DIAGNOSIS — E785 Hyperlipidemia, unspecified: Secondary | ICD-10-CM | POA: Diagnosis present

## 2016-08-17 DIAGNOSIS — Z681 Body mass index (BMI) 19 or less, adult: Secondary | ICD-10-CM | POA: Diagnosis not present

## 2016-08-17 DIAGNOSIS — I10 Essential (primary) hypertension: Secondary | ICD-10-CM | POA: Diagnosis not present

## 2016-08-17 DIAGNOSIS — I360 Nonrheumatic tricuspid (valve) stenosis: Secondary | ICD-10-CM | POA: Diagnosis not present

## 2016-08-17 DIAGNOSIS — M549 Dorsalgia, unspecified: Secondary | ICD-10-CM | POA: Diagnosis present

## 2016-08-17 DIAGNOSIS — E860 Dehydration: Secondary | ICD-10-CM | POA: Diagnosis present

## 2016-08-17 DIAGNOSIS — Z8249 Family history of ischemic heart disease and other diseases of the circulatory system: Secondary | ICD-10-CM | POA: Diagnosis not present

## 2016-08-17 DIAGNOSIS — F419 Anxiety disorder, unspecified: Secondary | ICD-10-CM | POA: Diagnosis present

## 2016-08-17 DIAGNOSIS — R8290 Unspecified abnormal findings in urine: Secondary | ICD-10-CM | POA: Diagnosis not present

## 2016-08-17 DIAGNOSIS — I429 Cardiomyopathy, unspecified: Secondary | ICD-10-CM | POA: Diagnosis present

## 2016-08-17 DIAGNOSIS — I5022 Chronic systolic (congestive) heart failure: Secondary | ICD-10-CM | POA: Diagnosis present

## 2016-08-17 DIAGNOSIS — N189 Chronic kidney disease, unspecified: Secondary | ICD-10-CM | POA: Diagnosis present

## 2016-08-17 DIAGNOSIS — E784 Other hyperlipidemia: Secondary | ICD-10-CM

## 2016-08-17 DIAGNOSIS — I517 Cardiomegaly: Secondary | ICD-10-CM | POA: Diagnosis present

## 2016-08-17 DIAGNOSIS — E7800 Pure hypercholesterolemia, unspecified: Secondary | ICD-10-CM | POA: Diagnosis present

## 2016-08-17 DIAGNOSIS — E876 Hypokalemia: Principal | ICD-10-CM

## 2016-08-17 DIAGNOSIS — Z800 Family history of malignant neoplasm of digestive organs: Secondary | ICD-10-CM

## 2016-08-17 DIAGNOSIS — K8020 Calculus of gallbladder without cholecystitis without obstruction: Secondary | ICD-10-CM | POA: Diagnosis not present

## 2016-08-17 DIAGNOSIS — T502X5A Adverse effect of carbonic-anhydrase inhibitors, benzothiadiazides and other diuretics, initial encounter: Secondary | ICD-10-CM | POA: Diagnosis present

## 2016-08-17 DIAGNOSIS — R5383 Other fatigue: Secondary | ICD-10-CM

## 2016-08-17 DIAGNOSIS — R441 Visual hallucinations: Secondary | ICD-10-CM | POA: Diagnosis not present

## 2016-08-17 DIAGNOSIS — E43 Unspecified severe protein-calorie malnutrition: Secondary | ICD-10-CM

## 2016-08-17 DIAGNOSIS — R739 Hyperglycemia, unspecified: Secondary | ICD-10-CM | POA: Diagnosis present

## 2016-08-17 LAB — COMPREHENSIVE METABOLIC PANEL
ALK PHOS: 70 U/L (ref 38–126)
ALT: 13 U/L — AB (ref 14–54)
AST: 20 U/L (ref 15–41)
Albumin: 3.4 g/dL — ABNORMAL LOW (ref 3.5–5.0)
Anion gap: 15 (ref 5–15)
BUN: 17 mg/dL (ref 6–20)
CALCIUM: 8.8 mg/dL — AB (ref 8.9–10.3)
CO2: 25 mmol/L (ref 22–32)
CREATININE: 0.9 mg/dL (ref 0.44–1.00)
Chloride: 103 mmol/L (ref 101–111)
Glucose, Bld: 113 mg/dL — ABNORMAL HIGH (ref 65–99)
Potassium: 2.4 mmol/L — CL (ref 3.5–5.1)
Sodium: 143 mmol/L (ref 135–145)
Total Bilirubin: 2 mg/dL — ABNORMAL HIGH (ref 0.3–1.2)
Total Protein: 6.1 g/dL — ABNORMAL LOW (ref 6.5–8.1)

## 2016-08-17 LAB — BASIC METABOLIC PANEL
Anion gap: 13 (ref 5–15)
BUN: 14 mg/dL (ref 6–20)
CHLORIDE: 100 mmol/L — AB (ref 101–111)
CO2: 26 mmol/L (ref 22–32)
Calcium: 8 mg/dL — ABNORMAL LOW (ref 8.9–10.3)
Creatinine, Ser: 0.73 mg/dL (ref 0.44–1.00)
GFR calc Af Amer: >60 mL/min (ref >60)
GLUCOSE: 137 mg/dL — AB (ref 65–99)
POTASSIUM: 2.6 mmol/L — AB (ref 3.5–5.1)
Sodium: 139 mmol/L (ref 135–145)

## 2016-08-17 LAB — CBC
HCT: 37.1 % (ref 36.0–46.0)
HEMOGLOBIN: 12.7 g/dL (ref 12.0–15.0)
MCH: 31.1 pg (ref 26.0–34.0)
MCHC: 34.2 g/dL (ref 30.0–36.0)
MCV: 90.9 fL (ref 78.0–100.0)
Platelets: 209 10*3/uL (ref 150–400)
RBC: 4.08 MIL/uL (ref 3.87–5.11)
RDW: 14 % (ref 11.5–15.5)
WBC: 6.8 10*3/uL (ref 4.0–10.5)

## 2016-08-17 LAB — MAGNESIUM: Magnesium: 1.9 mg/dL (ref 1.7–2.4)

## 2016-08-17 LAB — T4, FREE: Free T4: 1.12 ng/dL (ref 0.61–1.12)

## 2016-08-17 LAB — POCT I-STAT 4, (NA,K, GLUC, HGB,HCT)
Glucose, Bld: 117 mg/dL — ABNORMAL HIGH (ref 65–99)
HCT: 37 % (ref 36.0–46.0)
Hemoglobin: 12.6 g/dL (ref 12.0–15.0)
Potassium: 2.4 mmol/L — CL (ref 3.5–5.1)
SODIUM: 143 mmol/L (ref 135–145)

## 2016-08-17 LAB — RETICULOCYTES
RBC.: 3.82 MIL/uL — ABNORMAL LOW (ref 3.87–5.11)
Retic Count, Absolute: 26.7 10*3/uL (ref 19.0–186.0)
Retic Ct Pct: 0.7 % (ref 0.4–3.1)

## 2016-08-17 LAB — PREALBUMIN: PREALBUMIN: 10.6 mg/dL — AB (ref 18–38)

## 2016-08-17 LAB — PHOSPHORUS: Phosphorus: 2.2 mg/dL — ABNORMAL LOW (ref 2.5–4.6)

## 2016-08-17 LAB — FOLATE: FOLATE: 3 ng/mL — AB (ref >5.9)

## 2016-08-17 LAB — IRON AND TIBC
Iron: 44 ug/dL (ref 28–170)
SATURATION RATIOS: 23 % (ref 10.4–31.8)
TIBC: 193 ug/dL — AB (ref 250–450)
UIBC: 149 ug/dL

## 2016-08-17 LAB — VITAMIN B12: VITAMIN B 12: 303 pg/mL (ref 180–914)

## 2016-08-17 LAB — FERRITIN: Ferritin: 148 ng/mL (ref 11–307)

## 2016-08-17 LAB — SEDIMENTATION RATE: SED RATE: 20 mm/h (ref 0–22)

## 2016-08-17 LAB — TSH: TSH: 0.336 u[IU]/mL — ABNORMAL LOW (ref 0.350–4.500)

## 2016-08-17 SURGERY — LAPAROSCOPIC CHOLECYSTECTOMY WITH INTRAOPERATIVE CHOLANGIOGRAM
Anesthesia: General

## 2016-08-17 MED ORDER — BOOST / RESOURCE BREEZE PO LIQD
1.0000 | Freq: Three times a day (TID) | ORAL | Status: DC
  Administered 2016-08-17 – 2016-08-22 (×4): 1 via ORAL

## 2016-08-17 MED ORDER — DEXAMETHASONE SODIUM PHOSPHATE 10 MG/ML IJ SOLN
INTRAMUSCULAR | Status: AC
  Filled 2016-08-17: qty 1

## 2016-08-17 MED ORDER — LISINOPRIL 20 MG PO TABS: 20.0000 mg | ORAL_TABLET | Freq: Every day | ORAL | Status: DC

## 2016-08-17 MED ORDER — PROPOFOL 10 MG/ML IV BOLUS
INTRAVENOUS | Status: AC
  Filled 2016-08-17: qty 20

## 2016-08-17 MED ORDER — SODIUM CHLORIDE 0.9 % IV SOLN
12.5000 mg | INTRAVENOUS | Status: DC | PRN
  Filled 2016-08-17: qty 0.5

## 2016-08-17 MED ORDER — FOLIC ACID 5 MG/ML IJ SOLN
1.0000 mg | Freq: Every day | INTRAMUSCULAR | Status: DC
  Administered 2016-08-17 – 2016-08-24 (×8): 1 mg via INTRAVENOUS
  Filled 2016-08-17 (×8): qty 0.2

## 2016-08-17 MED ORDER — CEFAZOLIN SODIUM-DEXTROSE 2-4 GM/100ML-% IV SOLN
2.0000 g | INTRAVENOUS | Status: DC
  Filled 2016-08-17: qty 100

## 2016-08-17 MED ORDER — ENOXAPARIN SODIUM 30 MG/0.3ML ~~LOC~~ SOLN
30.0000 mg | Freq: Every day | SUBCUTANEOUS | Status: DC
  Administered 2016-08-18 – 2016-08-25 (×8): 30 mg via SUBCUTANEOUS
  Filled 2016-08-17 (×8): qty 0.3

## 2016-08-17 MED ORDER — POTASSIUM CHLORIDE CRYS ER 20 MEQ PO TBCR
40.0000 meq | EXTENDED_RELEASE_TABLET | Freq: Four times a day (QID) | ORAL | Status: DC
  Filled 2016-08-17 (×2): qty 2

## 2016-08-17 MED ORDER — POTASSIUM PHOSPHATES 15 MMOLE/5ML IV SOLN
40.0000 meq | Freq: Once | INTRAVENOUS | Status: AC
  Administered 2016-08-17: 40 meq via INTRAVENOUS
  Filled 2016-08-17: qty 9.09

## 2016-08-17 MED ORDER — ENOXAPARIN SODIUM 40 MG/0.4ML ~~LOC~~ SOLN: 40.0000 mg | Freq: Every day | SUBCUTANEOUS | Status: DC

## 2016-08-17 MED ORDER — ROCURONIUM BROMIDE 10 MG/ML (PF) SYRINGE
PREFILLED_SYRINGE | INTRAVENOUS | Status: AC
  Filled 2016-08-17: qty 5

## 2016-08-17 MED ORDER — ONDANSETRON 4 MG PO TBDP: 4.0000 mg | ORAL_TABLET | Freq: Four times a day (QID) | ORAL | Status: DC | PRN

## 2016-08-17 MED ORDER — MIDAZOLAM HCL 2 MG/2ML IJ SOLN
INTRAMUSCULAR | Status: AC
  Filled 2016-08-17: qty 2

## 2016-08-17 MED ORDER — FENTANYL CITRATE (PF) 250 MCG/5ML IJ SOLN
INTRAMUSCULAR | Status: AC
  Filled 2016-08-17: qty 5

## 2016-08-17 MED ORDER — MORPHINE SULFATE (PF) 4 MG/ML IV SOLN: 2.0000 mg | INTRAVENOUS | Status: DC | PRN

## 2016-08-17 MED ORDER — ONDANSETRON HCL 4 MG/2ML IJ SOLN: 4.0000 mg | Freq: Four times a day (QID) | INTRAMUSCULAR | Status: DC | PRN

## 2016-08-17 MED ORDER — POTASSIUM CHLORIDE IN NACL 20-0.9 MEQ/L-% IV SOLN
INTRAVENOUS | Status: DC
  Administered 2016-08-17: 12:00:00 via INTRAVENOUS
  Administered 2016-08-18: 1000 mL via INTRAVENOUS
  Filled 2016-08-17 (×3): qty 1000

## 2016-08-17 MED ORDER — ALUM & MAG HYDROXIDE-SIMETH 200-200-20 MG/5ML PO SUSP
15.0000 mL | Freq: Four times a day (QID) | ORAL | Status: DC | PRN
  Filled 2016-08-17: qty 30

## 2016-08-17 MED ORDER — LACTATED RINGERS IV SOLN
INTRAVENOUS | Status: DC
  Administered 2016-08-17: 07:00:00 via INTRAVENOUS

## 2016-08-17 MED ORDER — SUCCINYLCHOLINE CHLORIDE 200 MG/10ML IV SOSY
PREFILLED_SYRINGE | INTRAVENOUS | Status: AC
  Filled 2016-08-17: qty 10

## 2016-08-17 MED ORDER — LIDOCAINE 2% (20 MG/ML) 5 ML SYRINGE
INTRAMUSCULAR | Status: AC
  Filled 2016-08-17: qty 5

## 2016-08-17 MED ORDER — LORAZEPAM 2 MG/ML IJ SOLN
0.5000 mg | Freq: Four times a day (QID) | INTRAMUSCULAR | Status: DC
  Administered 2016-08-17 – 2016-08-18 (×3): 0.5 mg via INTRAVENOUS
  Filled 2016-08-17 (×4): qty 1

## 2016-08-17 MED ORDER — ONDANSETRON HCL 4 MG/2ML IJ SOLN
INTRAMUSCULAR | Status: AC
  Filled 2016-08-17: qty 2

## 2016-08-17 MED ORDER — ENALAPRILAT 1.25 MG/ML IV SOLN
0.6250 mg | Freq: Four times a day (QID) | INTRAVENOUS | Status: DC
  Administered 2016-08-17 – 2016-08-20 (×12): 0.625 mg via INTRAVENOUS
  Filled 2016-08-17 (×15): qty 0.5

## 2016-08-17 MED ORDER — PANTOPRAZOLE SODIUM 40 MG IV SOLR
40.0000 mg | Freq: Every day | INTRAVENOUS | Status: DC
  Administered 2016-08-17 – 2016-08-24 (×8): 40 mg via INTRAVENOUS
  Filled 2016-08-17 (×8): qty 40

## 2016-08-17 MED ORDER — SUGAMMADEX SODIUM 200 MG/2ML IV SOLN
INTRAVENOUS | Status: AC
  Filled 2016-08-17: qty 2

## 2016-08-17 MED ORDER — MAGIC MOUTHWASH
10.0000 mL | Freq: Four times a day (QID) | ORAL | Status: DC
  Administered 2016-08-17 – 2016-08-24 (×21): 10 mL via ORAL
  Filled 2016-08-17 (×18): qty 10

## 2016-08-17 MED ORDER — PROMETHAZINE HCL 25 MG/ML IJ SOLN: 12.5000 mg | Freq: Four times a day (QID) | INTRAMUSCULAR | Status: DC | PRN

## 2016-08-17 NOTE — Progress Notes (Addendum)
Phosphorus slightly low at 2.2 therefore will give 40 mEq potassium phosphate IV 1 bolus infusion and repeat labs in a.m. TSH slightly low as well so we'll obtain free T4 and T3 in a.m. In review of nursing documentation patient had a burst of SVT with ventricular rates in the 140s that quickly went back to the 70s. Suspect this was related to volume depletion and will continue to monitor telemetry  Folate 3.0- begin IV Folic acid  Junious Silk, ANP

## 2016-08-17 NOTE — Progress Notes (Signed)
Dr Noreene Larsson called and informed of Potassium ordered to start Lr and run at 200 cc an hour.

## 2016-08-17 NOTE — Progress Notes (Signed)
Dr Corliss Skains called and informed of potassium level and that she is pale with dry cracked lips. States that he will be by to see her.

## 2016-08-17 NOTE — Progress Notes (Signed)
CRITICAL VALUE ALERT  Critical value received:  K 2.6  Date of notification:  08/17/2016  Time of notification:  2050  Critical value read back:Yes.    Nurse who received alert:  Sue Lush RN  MD notified (1st page):  NP. Craige Cotta  Time of first page:  2051  Responding MD:  NP Craige Cotta  Time MD responded:  2105  Potassium Phosphate 40 mEq in dextrose 5 % ordered

## 2016-08-17 NOTE — Anesthesia Preprocedure Evaluation (Addendum)
Anesthesia Evaluation  Patient identified by MRN, date of birth, ID band Patient awake    Reviewed: Allergy & Precautions, NPO status , Patient's Chart, lab work & pertinent test results  Airway Mallampati: II  TM Distance: >3 FB Neck ROM: Full    Dental  (+) Teeth Intact, Dental Advisory Given   Pulmonary neg pulmonary ROS,    Pulmonary exam normal breath sounds clear to auscultation       Cardiovascular hypertension, Pt. on medications Normal cardiovascular exam Rhythm:Regular Rate:Normal     Neuro/Psych PSYCHIATRIC DISORDERS Anxiety Depression negative neurological ROS     GI/Hepatic negative GI ROS, Neg liver ROS,   Endo/Other  negative endocrine ROS  Renal/GU Renal InsufficiencyRenal disease     Musculoskeletal negative musculoskeletal ROS (+)   Abdominal   Peds  Hematology negative hematology ROS (+)   Anesthesia Other Findings Day of surgery medications reviewed with the patient.  Reproductive/Obstetrics                             Anesthesia Physical Anesthesia Plan  ASA: II  Anesthesia Plan: General   Post-op Pain Management:    Induction: Intravenous  Airway Management Planned: Oral ETT  Additional Equipment:   Intra-op Plan:   Post-operative Plan: Extubation in OR  Informed Consent: I have reviewed the patients History and Physical, chart, labs and discussed the procedure including the risks, benefits and alternatives for the proposed anesthesia with the patient or authorized representative who has indicated his/her understanding and acceptance.   Dental advisory given  Plan Discussed with: CRNA  Anesthesia Plan Comments: (Cancelled case for hypokalemia)       Anesthesia Quick Evaluation

## 2016-08-17 NOTE — Progress Notes (Addendum)
Report called to Sarah on 5w. Dr Desmond Lope informed of run of SVT heart rate 140 then back to 70's.

## 2016-08-17 NOTE — Progress Notes (Signed)
Dr Noreene Larsson in to see the patient.

## 2016-08-17 NOTE — Progress Notes (Signed)
Pt admitted to the unit room 5W 01. Pt mental status is A&O x2. Pt oriented to room, staff, and call bell. Skin is intact except where otherwise charted. Full assessment charted in [facility]. Call bell within reach. Visitor guidelines reviewed w/ pt and/or family.

## 2016-08-17 NOTE — H&P (Signed)
History of Present Illness  The patient is a 71 year old female who presents for evaluation of gall stones.   Referred by Dr. Lupe Carney for symptomatic gallstones GI - Dr. Evette Cristal  This is a 71 year old female who presents with a one-month history of persistent nausea and vomiting as well as weight loss. She has had significant constipation. She denies any diarrhea. Her symptoms seem to be exacerbated by eating. She is able to keep down liquids. Zofran does not seem to be effective in controlling her nausea. She was evaluated in the emergency department on 3/30 and had a CT scan of the abdomen and pelvis showing gallstones but no sign of acute cholecystitis. She also had some mild submucosal fatty deposition within the cecum and ascending colon. The etiology of this was unclear. The patient has never had a colonoscopy despite a family history of colon cancer in her sister. The patient continues to be fairly nauseated and continues to lose weight.   She underwent EGD and colonoscopy by Dr. Evette Cristal that were unremarkable.  She has been seen in the ED recently for weakness, dehydration, and hypokalemia.  Her surgery today is canceled for Potassium 2.4    CLINICAL DATA: Anorexia over the past 3 weeks. Food tastes since miles bad per patient. Dizziness.  EXAM: CT ABDOMEN AND PELVIS WITH CONTRAST  TECHNIQUE: Multidetector CT imaging of the abdomen and pelvis was performed using the standard protocol following bolus administration of intravenous contrast.  CONTRAST: ISOVUE-300 IOPAMIDOL (ISOVUE-300) INJECTION 61%  COMPARISON: None.  FINDINGS: Lower chest: Minimal scarring or atelectasis at the left lung base. Normal size cardiac chambers. No pericardial effusion.  Hepatobiliary: Innumerable small hypodensities scattered throughout the liver statistically consistent with cysts, largest is in left hepatic measuring 1.6 cm in diameter. The gallbladder is distended and  there is is a 1.8 cm gallstone near the neck. No wall thickening or pericholecystic fluid is seen however.  Pancreas: Unremarkable. No pancreatic ductal dilatation or surrounding inflammatory changes.  Spleen: Normal in size without focal abnormality.  Adrenals/Urinary Tract: Normal bilateral adrenal glands. Polycystic appearance of both kidneys. No solid enhancing mass lesions are apparent. No nephrolithiasis. No hydronephrosis. No ureteral dilatation or calculi. No bladder is physiologically distended.  Stomach/Bowel: Nondistended stomach. Normal small bowel rotation without obstruction. Contracted appearance of the cecum and ascending colon with submucosal fatty deposition noted possibly related to changes of inflammatory bowel. No acute bowel obstruction is noted.  Vascular/Lymphatic: Aortic atherosclerosis. No enlarged abdominal or pelvic lymph nodes.  Reproductive: Uterus and bilateral adnexa are unremarkable.  Other: No abdominal wall hernia or abnormality. No abdominopelvic ascites.  Musculoskeletal: Degenerative disc disease L2-3 and L3-4. No acute osseous appearing abnormalities.  IMPRESSION: 1. Polycystic appearance of the kidneys with associated cysts of the liver which may reflect autosomal dominant polycystic renal disease. 2. Mild submucosal fatty deposition within cecum ascending colon without acute bowel inflammation or obstruction noted. Findings may reflect sequela of inflammatory bowel disease. 3. Cholelithiasis with gallbladder distention possibly from a fasting state. No wall thickening or pericholecystic fluid however is noted.   Electronically Signed By: Tollie Eth M.D. On: 07/15/2016 17:23 CLINICAL DATA:  Abdominal pain  EXAM: DG ABDOMEN ACUTE W/ 1V CHEST  COMPARISON:  None.  FINDINGS: There is no evidence of dilated bowel loops or free intraperitoneal air. No radiopaque calculi or other significant  radiographic abnormality is seen. Heart size and mediastinal contours are within normal limits. Both lungs are clear.  IMPRESSION: Negative abdominal radiographs.  No acute  cardiopulmonary disease.   Electronically Signed   By: Elige Ko   On: 08/13/2016 10:07  Past Surgical History  No pertinent past surgical history  Diagnostic Studies History  Colonoscopy never Mammogram >3 years ago Pap Smear >5 years ago  Allergies  No Known Drug Allergies Allergies Reconciled  Medication History  ALPRAZolam (0.25MG  Tablet, Oral) Active. Atorvastatin Calcium (20MG  Tablet, Oral) Active. Ondansetron (8MG  Tablet Disint, Oral) Active. Lisinopril (20MG  Tablet, Oral) Active. Potassium Chloride ER ( Tablet ER, Oral) Active. Medications Reconciled  Social History  Caffeine use Carbonated beverages. No alcohol use No drug use Tobacco use Never smoker.  Family History  Cancer Father. Colon Cancer Sister. Hypertension Mother. Thyroid problems Mother.  Pregnancy / Birth History  Age at menarche 16 years. Age of menopause 89-50 Gravida 34 Maternal age 15-25 Para 3  Other Problems  Anxiety Disorder Back Pain Cholelithiasis Gastroesophageal Reflux Disease High blood pressure Hypercholesterolemia    Review of Systems General Present- Appetite Loss and Weight Loss. Not Present- Chills, Fatigue, Fever, Night Sweats and Weight Gain. Skin Not Present- Change in Wart/Mole, Dryness, Hives, Jaundice, New Lesions, Non-Healing Wounds, Rash and Ulcer. HEENT Present- Hearing Loss and Ringing in the Ears. Not Present- Earache, Hoarseness, Nose Bleed, Oral Ulcers, Seasonal Allergies, Sinus Pain, Sore Throat, Visual Disturbances, Wears glasses/contact lenses and Yellow Eyes. Respiratory Not Present- Bloody sputum, Chronic Cough, Difficulty Breathing, Snoring and Wheezing. Breast Not Present- Breast Mass, Breast Pain, Nipple Discharge and Skin  Changes. Cardiovascular Present- Chest Pain. Not Present- Difficulty Breathing Lying Down, Leg Cramps, Palpitations, Rapid Heart Rate, Shortness of Breath and Swelling of Extremities. Gastrointestinal Present- Abdominal Pain, Indigestion, Nausea and Vomiting. Not Present- Bloating, Bloody Stool, Change in Bowel Habits, Chronic diarrhea, Constipation, Difficulty Swallowing, Excessive gas, Gets full quickly at meals, Hemorrhoids and Rectal Pain. Female Genitourinary Not Present- Frequency, Nocturia, Painful Urination, Pelvic Pain and Urgency. Musculoskeletal Present- Back Pain. Not Present- Joint Pain, Joint Stiffness, Muscle Pain, Muscle Weakness and Swelling of Extremities. Neurological Present- Weakness. Not Present- Decreased Memory, Fainting, Headaches, Numbness, Seizures, Tingling, Tremor and Trouble walking. Psychiatric Present- Anxiety. Not Present- Bipolar, Change in Sleep Pattern, Depression, Fearful and Frequent crying. Endocrine Not Present- Cold Intolerance, Excessive Hunger, Hair Changes, Heat Intolerance, Hot flashes and New Diabetes. Hematology Not Present- Blood Thinners, Easy Bruising, Excessive bleeding, Gland problems, HIV and Persistent Infections.  Vitals  Weight: 94 lb Height: 62in Body Surface Area: 1.39 m Body Mass Index: 17.19 kg/m  Temp.: 9F  Pulse: 99 (Regular)  BP: 100/78 (Sitting, Left Arm, Standard)     Physical Exam   The physical exam findings are as follows: Note:Thin elderly female - appears weak Eyes: Pupils equal, round; sclera anicteric HENT: Oral mucosa dry; good dentition Neck: No masses palpated, no thyromegaly Lungs: CTA bilaterally; normal respiratory effort CV: Regular rate and rhythm; no murmurs; extremities well-perfused with no edema Abd: +bowel sounds, soft, minimal epigastric/ RUQ tenderness; no sign of hernia or hepatosplenomegaly Skin: Warm, dry; no sign of jaundice Psychiatric - alert and oriented x 4; calm mood and  affect    Assessment & Plan   CHRONIC CHOLECYSTITIS WITH CALCULUS (K80.10)  NAUSEA AND/OR VOMITING (R11.2)  Current Plans Was scheduled for elective laparoscopic cholecystectomy today.  Surgery postponed because of dehydration and hypokalemia.  Will admit to hospital and consult [facility] for evaluation and management.  If her condition improves, she may be able to undergo surgery later this week.  Wilmon Arms. Corliss Skains, MD, [facility]  General/ Trauma Surgery  08/17/2016 8:20 AM

## 2016-08-17 NOTE — Progress Notes (Signed)
Attempted to call report to 5w. States the nurse will call be back.

## 2016-08-17 NOTE — Consult Note (Signed)
Consultation Note   Colleen Davis MLJ:[id number] DOB: Mar 05, 1946 DOA: 08/17/2016   PCP: Donnie Coffin, MD   Requesting physician: Dr. Georgette Dover  Reason for consultation: Assist with management of hypokalemia and other medical problems   HPI: Colleen Davis is a 71 y.o. female with medical history significant for hypertension on ACE inhibitor/thiazide diuretic combination, dyslipidemia and hypertension. Patient was scheduled to have elective cholecystectomy in the setting of symptomatic cholelithiasis today. Unfortunately her preoperative potassium was low at 2.4 therefore surgery was canceled and medical consultation was requested.   In further discussion with the patient and her family, patient has been having ongoing GI issues since November 2017. She has had a 40 pound weight loss since onset of symptoms. Symptoms are primarily related to painful inflammation of the mouth and initially started as ulcers, persistent nausea, and crampy abdominal pain after eating. Patient has not had any blood in her stools or emesis. The emesis is reported as green and bilious. Over the past 30 days patient has become weak and if she is unable to ambulate and has required a wheelchair for mobility. She complains of blurred vision especially with standing. She has not been sleeping well especially over the past 2 weeks. Over the past 1 week she has been having visual hallucinations and has become somewhat paranoid. She had a colonoscopy scheduled but was unable to tolerate the prep therefore this was canceled.  Review of Systems:  In addition to the HPI above,  No Fever-chills, myalgias or other constitutional symptoms No Headache, changes with Vision or hearing, new focal weakness, tingling, numbness in any extremity, dysarthria or word finding difficulty, tremors or seizure activity No problems swallowing food or Liquids, she does report pain in the mouth with attempts to eat, no indigestion/reflux, choking or  coughing while eating No Chest pain, Cough or Shortness of Breath, palpitations, orthopnea or DOE No melena,hematochezia, dark tarry stools No dysuria, malodorous urine, hematuria or flank pain No new skin rashes, lesions, masses or bruises, No new joint pains, aches, swelling or redness No recent unintentional weight gain  No polyuria, polydypsia or polyphagia   Past Medical History:  Diagnosis Date  . Anxiety   . Chronic kidney disease    polycystic  . Dehydration    recently hosp  . Depression   . History of kidney stones    cyst on kidney  . Hyperlipidemia   . Hypertension   . Hypokalemia     Past Surgical History:  Procedure Laterality Date  . DILATION AND CURETTAGE OF UTERUS    . TUBAL LIGATION      Social History   Social History  . Marital status: Divorced    Spouse name: N/A  . Number of children: N/A  . Years of education: N/A   Occupational History  . Not on file.   Social History Main Topics  . Smoking status: Never Smoker  . Smokeless tobacco: Never Used  . Alcohol use No  . Drug use: No  . Sexual activity: Not on file   Other Topics Concern  . Not on file   Social History Narrative  . No narrative on file    Mobility: Wheelchair Work history: Not obtained   Allergies  Allergen Reactions  . No Known Allergies     Family history reviewed and not pertinent To current findings or diagnosis/evaluation  Prior to Admission medications   Medication Sig Start Date End Date Taking? Authorizing Provider  ALPRAZolam Duanne Moron) 0.25 MG tablet Take  0.25 mg by mouth 3 (three) times daily as needed for anxiety.    Yes Historical Provider, MD  atorvastatin (LIPITOR) 20 MG tablet Take 20 mg by mouth daily at 6 PM.   Yes Historical Provider, MD  lisinopril-hydrochlorothiazide (PRINZIDE,ZESTORETIC) 20-25 MG per tablet Take 1 tablet by mouth daily.   Yes Historical Provider, MD  ondansetron (ZOFRAN) 4 MG tablet Take 1 tablet (4 mg total) by mouth every 6  (six) hours. 08/06/16  Yes Jenny Reichmann, MD  potassium chloride (K-DUR) 10 MEQ tablet Take 1 tablet (10 mEq total) by mouth daily. 06/22/16  Yes Shary Decamp, PA-C    Physical Exam: Vitals:   08/17/16 0627 08/17/16 0652  BP: (!) 145/87   Pulse: 69   Resp: 16   Temp: 98 F (36.7 C)   TempSrc: Oral   SpO2: 100%   Weight: 40.8 kg (90 lb) 40.8 kg (90 lb)  Height:  5' 2.5" (1.588 m)      Constitutional: Calm but pale in appearance-appears very underweight Eyes: PERRL, lids and conjunctivae normal ENMT: Mucous membranes are dry and quite reddened and erythematous without any definite discrete ulcerations noted Posterior pharynx clear of any exudate or lesions. Neck: normal, supple, no masses, no thyromegaly Respiratory: clear to auscultation bilaterally, no wheezing, no crackles. Normal respiratory effort. No accessory muscle use.  Cardiovascular: Regular rate and rhythm, no murmurs / rubs / gallops. No extremity edema. 2+ pedal pulses. No carotid bruits.  Abdomen: no tenderness, no masses palpated. No hepatosplenomegaly. Bowel sounds positive.  Musculoskeletal: no clubbing / cyanosis. No joint deformity upper and lower extremities. Good ROM, no contractures. Normal muscle tone.  Skin: no rashes, lesions, ulcers. No induration-bruising on right forearm consistent with prior phlebotomy Neurologic: CN 2-12 grossly intact. Sensation intact, DTR normal. Strength 5/5 x all 4 extremities.  Psychiatric: Normal judgment and insight. Alert and oriented x 3. Normal mood. Denies current hallucinations when asked   Labs on Admission: I have personally reviewed following labs and imaging studies  CBC:  Recent Labs Lab 08/13/16 0813 08/13/16 0900 08/15/16 1421 08/17/16 0656 08/17/16 0727  WBC SPECIMEN CLOTTED NOTIFIED LISA AT 0850 ON 042818 BY HOOKER,B 8.5 8.5  --  6.8  NEUTROABS PENDING 7.6  --   --   --   HGB SPECIMEN CLOTTED NOTIFIED LISA AT 0850 ON 042818 BY HOOKER,B 12.9 13.6 12.6 12.7    HCT SPECIMEN CLOTTED NOTIFIED LISA AT 0850 ON 042818 BY HOOKER,B 36.5 39.6 37.0 37.1  MCV SPECIMEN CLOTTED NOTIFIED LISA AT 0850 ON 042818 BY HOOKER,B 90.3 90.6  --  90.9  PLT SPECIMEN CLOTTED NOTIFIED LISA AT 0850 ON 042818 BY HOOKER,B 206 285  --  916   Basic Metabolic Panel:  Recent Labs Lab 08/13/16 0813 08/15/16 1421 08/17/16 0656 08/17/16 0727  NA 137 139 143 143  K 2.8* 2.5* 2.4* 2.4*  CL 98* 100*  --  103  CO2 23 22  --  25  GLUCOSE 125* 124* 117* 113*  BUN 18 17  --  17  CREATININE 0.88 0.93  --  0.90  CALCIUM 9.2 9.1  --  8.8*   GFR: Estimated Creatinine Clearance: 38 mL/min (by C-G formula based on SCr of 0.9 mg/dL). Liver Function Tests:  Recent Labs Lab 08/13/16 0813 08/17/16 0727  AST 23 20  ALT 12* 13*  ALKPHOS 80 70  BILITOT 2.0* 2.0*  PROT 6.9 6.1*  ALBUMIN 4.1 3.4*    Recent Labs Lab 08/13/16 0813  LIPASE 30  No results for input(s): AMMONIA in the last 168 hours. Coagulation Profile: No results for input(s): INR, PROTIME in the last 168 hours. Cardiac Enzymes: No results for input(s): CKTOTAL, CKMB, CKMBINDEX, TROPONINI in the last 168 hours. BNP (last 3 results) No results for input(s): PROBNP in the last 8760 hours. HbA1C: No results for input(s): HGBA1C in the last 72 hours. CBG: No results for input(s): GLUCAP in the last 168 hours. Lipid Profile: No results for input(s): CHOL, HDL, LDLCALC, TRIG, CHOLHDL, LDLDIRECT in the last 72 hours. Thyroid Function Tests: No results for input(s): TSH, T4TOTAL, FREET4, T3FREE, THYROIDAB in the last 72 hours. Anemia Panel: No results for input(s): VITAMINB12, FOLATE, FERRITIN, TIBC, IRON, RETICCTPCT in the last 72 hours. Urine analysis:    Component Value Date/Time   COLORURINE AMBER (A) 08/13/2016 1231   APPEARANCEUR HAZY (A) 08/13/2016 1231   LABSPEC 1.020 08/13/2016 1231   PHURINE 6.0 08/13/2016 1231   GLUCOSEU NEGATIVE 08/13/2016 1231   HGBUR NEGATIVE 08/13/2016 1231   BILIRUBINUR  SMALL (A) 08/13/2016 1231   KETONESUR 80 (A) 08/13/2016 1231   PROTEINUR 30 (A) 08/13/2016 1231   UROBILINOGEN 1.0 11/16/2011 2141   NITRITE NEGATIVE 08/13/2016 1231   LEUKOCYTESUR LARGE (A) 08/13/2016 1231   Sepsis Labs: @LABRCNTIP (procalcitonin:4,lacticidven:4) )No results found for this or any previous visit (from the past 240 hour(s)).   Radiological Exams on Admission: No results found.  EKG: (Independently reviewed) Ordered and results pending  Assessment/Plan Principal Problem:   Hypokalemia -This is been an ongoing problem for this patient since 2013 and has been worsened by ongoing GI losses since November 2017 -Suspect thiazide diuretic contributing  -Outpatient potassium only 10 mEq and patient has been experiencing ongoing nausea and vomiting -We'll add potassium to maintenance IV fluids -Check magnesium and phosphorus -We will try full liquid diet and oral potassium replacement but given history we may need to utilize IV potassium boluses -Hold thiazide diuretic -12-lead EKG  Active Problems:   Abnormal urinalysis -Noted on ER visit last week -For completeness of exam check urine culture   Severe protein calorie malnutrition -Patient reports significant weight loss beginning November 2017 -Oral pain contributing; apparently started as discrete ulcers in mouth therefore will obtain HSV culture -may need to initiate empiric acyclovir -In review of weight graph in April 2017 patient weighed 123 lbs and current weight is 90 lbs -Magic mouthwash or equivalent for oral breakdown -Chemical injury from repeated bilious emesis could also explain oral breakdown -Nutrition consultation -Protein supplementation -Reports blurry vision -maybe orthostatic symptoms -we'll check orthostatic vital signs and increase maintenance fluid with potassium to 125/hr -Check anemia panel and ESR    Visual hallucinations/history of anxiety disorder -Seems to be combination of  malnutrition and sleep deprivation -Of note patient has been unable to keep down oral medications and is prescribed prn Xanax at home- could be going through benzodiazepine withdrawal therefore will order scheduled low-dose IV Ativan and monitor response -Check TSH, RPR -Family denies patient with dementia symptoms/short-term memory issues    Failure to thrive -Secondary to weight loss and ongoing GI symptoms -PT/OT evaluation    HTN (hypertension) -On lisinopril 20 mg daily-given her recurrent nausea and vomiting will utilize IV ACE inhibitor for now -See below regarding echocardiogram    Hyperlipidemia -Continue Lipitor    Mild concentric left ventricular hypertrophy (LVH) -Last echocardiogram was in 2013 -Given possible need to adjust antihypertensive medications based on persistent hypokalemia Will obtain echo this admission    Symptomatic cholelithiasis -Management per  primary team -Schedule Zofran with prn Phenergan IV      DVT prophylaxis: Lovenox and SCDs-per primary team Code Status: Full Family Communication:  Disposition Plan: At discretion of primary team     Maxi Rodas L. ANP-BC [facility] Pager [phone]   If 7PM-7AM, please contact night-coverage [email or web address] Password [email or web address]  08/17/2016, 9:08 AM

## 2016-08-17 NOTE — Progress Notes (Addendum)
Went to the ED Saturday due to vomiting. Was given Zofran and potassium Per her sister. Sister states "Monday she started seeing Things" Moved pt to [address] and place her on tele to monitor.

## 2016-08-18 ENCOUNTER — Inpatient Hospital Stay (HOSPITAL_COMMUNITY): Payer: Medicare Other

## 2016-08-18 DIAGNOSIS — I360 Nonrheumatic tricuspid (valve) stenosis: Secondary | ICD-10-CM

## 2016-08-18 HISTORY — PX: TRANSTHORACIC ECHOCARDIOGRAM: SHX275

## 2016-08-18 LAB — CBC WITH DIFFERENTIAL/PLATELET
BASOS PCT: 0 %
Basophils Absolute: 0 10*3/uL (ref 0.0–0.1)
EOS ABS: 0 10*3/uL (ref 0.0–0.7)
Eosinophils Relative: 1 %
HCT: 34.7 % — ABNORMAL LOW (ref 36.0–46.0)
Hemoglobin: 11.6 g/dL — ABNORMAL LOW (ref 12.0–15.0)
LYMPHS ABS: 1 10*3/uL (ref 0.7–4.0)
Lymphocytes Relative: 13 %
MCH: 30.1 pg (ref 26.0–34.0)
MCHC: 33.4 g/dL (ref 30.0–36.0)
MCV: 90.1 fL (ref 78.0–100.0)
Monocytes Absolute: 0.4 10*3/uL (ref 0.1–1.0)
Monocytes Relative: 5 %
NEUTROS ABS: 5.8 10*3/uL (ref 1.7–7.7)
NEUTROS PCT: 81 %
Platelets: 206 10*3/uL (ref 150–400)
RBC: 3.85 MIL/uL — ABNORMAL LOW (ref 3.87–5.11)
RDW: 13.6 % (ref 11.5–15.5)
WBC: 7.2 10*3/uL (ref 4.0–10.5)

## 2016-08-18 LAB — COMPREHENSIVE METABOLIC PANEL
ALT: 12 U/L — AB (ref 14–54)
ANION GAP: 15 (ref 5–15)
AST: 21 U/L (ref 15–41)
Albumin: 3.3 g/dL — ABNORMAL LOW (ref 3.5–5.0)
Alkaline Phosphatase: 66 U/L (ref 38–126)
BUN: 11 mg/dL (ref 6–20)
CALCIUM: 7.5 mg/dL — AB (ref 8.9–10.3)
CHLORIDE: 96 mmol/L — AB (ref 101–111)
CO2: 25 mmol/L (ref 22–32)
CREATININE: 0.7 mg/dL (ref 0.44–1.00)
Glucose, Bld: 134 mg/dL — ABNORMAL HIGH (ref 65–99)
Potassium: 2.4 mmol/L — CL (ref 3.5–5.1)
Sodium: 136 mmol/L (ref 135–145)
Total Bilirubin: 1.4 mg/dL — ABNORMAL HIGH (ref 0.3–1.2)
Total Protein: 5.5 g/dL — ABNORMAL LOW (ref 6.5–8.1)

## 2016-08-18 LAB — URINE CULTURE

## 2016-08-18 LAB — ECHOCARDIOGRAM COMPLETE
HEIGHTINCHES: 62.5 in
Weight: 1440 oz

## 2016-08-18 LAB — PHOSPHORUS: PHOSPHORUS: 5.4 mg/dL — AB (ref 2.5–4.6)

## 2016-08-18 LAB — RPR: RPR: NONREACTIVE

## 2016-08-18 LAB — HIV ANTIBODY (ROUTINE TESTING W REFLEX): HIV SCREEN 4TH GENERATION: NONREACTIVE

## 2016-08-18 MED ORDER — LORAZEPAM 2 MG/ML IJ SOLN: 0.5000 mg | Freq: Three times a day (TID) | INTRAMUSCULAR | Status: DC | PRN

## 2016-08-18 MED ORDER — POTASSIUM PHOSPHATES 15 MMOLE/5ML IV SOLN
40.0000 meq | Freq: Once | INTRAVENOUS | Status: DC
  Filled 2016-08-18: qty 9.09

## 2016-08-18 MED ORDER — POTASSIUM CHLORIDE 10 MEQ/100ML IV SOLN
10.0000 meq | INTRAVENOUS | Status: DC
  Administered 2016-08-18 (×4): 10 meq via INTRAVENOUS
  Filled 2016-08-18 (×3): qty 100

## 2016-08-18 MED ORDER — LORAZEPAM 2 MG/ML IJ SOLN
0.5000 mg | Freq: Every evening | INTRAMUSCULAR | Status: DC | PRN
  Administered 2016-08-18: 0.5 mg via INTRAVENOUS
  Filled 2016-08-18: qty 1

## 2016-08-18 MED ORDER — POTASSIUM CHLORIDE 2 MEQ/ML IV SOLN
INTRAVENOUS | Status: DC
  Administered 2016-08-18 – 2016-08-19 (×2): via INTRAVENOUS
  Filled 2016-08-18 (×4): qty 1000

## 2016-08-18 MED ORDER — POTASSIUM CHLORIDE 10 MEQ/100ML IV SOLN
10.0000 meq | INTRAVENOUS | Status: AC
  Administered 2016-08-18 (×6): 10 meq via INTRAVENOUS
  Filled 2016-08-18 (×7): qty 100

## 2016-08-18 MED ORDER — POTASSIUM CHLORIDE 20 MEQ/15ML (10%) PO SOLN
40.0000 meq | ORAL | Status: DC
  Filled 2016-08-18: qty 30

## 2016-08-18 NOTE — Evaluation (Signed)
Physical Therapy Evaluation Patient Details Name: Colleen Davis MRN: [id number] DOB: 07/29/1945 Today's Date: 08/18/2016   History of Present Illness  71 year old female who presents with a one-month history of persistent nausea and vomiting as well as weight loss. PMHx: Anxiety, CKD, Depression, Hyperipidemia, HTN.  Clinical Impression  Pt admitted secondary to problems above with deficits below. PTA, pt was independent with mobility and living alone. Upon evaluation, pt very lethargic and inconsistently following commands. Pt requiring max A for mobility this session, and mobility limited to bed mobility. Pt family concerned about swallowing difficulties. Recommend SLP consult, notified RN to place order. Recommending d/c recommendations below to increase functional mobility independence. Will continue to follow acutely and progress mobility according to pt tolerance.     Follow Up Recommendations SNF;Supervision/Assistance - 24 hour    Equipment Recommendations  None recommended by PT    Recommendations for Other Services Speech consult     Precautions / Restrictions Precautions Precautions: Fall Restrictions Weight Bearing Restrictions: No      Mobility  Bed Mobility Overal bed mobility: Needs Assistance Bed Mobility: Supine to Sit;Sit to Supine;Rolling Rolling: Mod assist   Supine to sit: Max assist Sit to supine: Max assist   General bed mobility comments: Pt attempting to assist with bed mobility, however, lethargy imparing ability to participate. Pt able to assist with rolling with mod manual cues. Rolling requiring for cleanup as pt had episode of incontinence.  Transfers                 General transfer comment: Not assess secondary to decreased safety.   Ambulation/Gait                Stairs            Wheelchair Mobility    Modified Rankin (Stroke Patients Only)       Balance Overall balance assessment: Needs  assistance Sitting-balance support: Feet supported;No upper extremity supported Sitting balance-Leahy Scale: Poor Sitting balance - Comments: mod to max A to maintain sitting balance. Verbal cues to help with sitting balance, however, pt responded inconsistently.                                      Pertinent Vitals/Pain Pain Assessment: No/denies pain    Home Living Family/patient expects to be discharged to:: Private residence Living Arrangements: Alone Available Help at Discharge: Family (sister has been staying with her for the past week ) Type of Home: Mobile home Home Access: Ramped entrance     Home Layout: One level Home Equipment: None Additional Comments: Sister present and providing information    Prior Function Level of Independence: Independent         Comments: Pt has been independent with ADL/mobility up until ~1 week ago, she has had increased weakness and sister has been providing assist     Hand Dominance        Extremity/Trunk Assessment   Upper Extremity Assessment Upper Extremity Assessment: Defer to OT evaluation    Lower Extremity Assessment Lower Extremity Assessment: Generalized weakness;Difficult to assess due to impaired cognition    Cervical / Trunk Assessment Cervical / Trunk Assessment: Kyphotic  Communication   Communication: Other (comment) (slurred speech, lethargic)  Cognition Arousal/Alertness: Lethargic Behavior During Therapy: Flat affect Overall Cognitive Status: Impaired/Different from baseline Area of Impairment: Orientation;Attention;Following commands;Awareness;Problem solving  Orientation Level: Person;Place;Time;Situation Current Attention Level: Focused   Following Commands: Follows one step commands inconsistently;Follows one step commands with increased time   Awareness: Intellectual Problem Solving: Slow processing;Decreased initiation;Difficulty sequencing;Requires verbal  cues;Requires tactile cues General Comments: Pt unable to state name, but could state birthday. Responded to name, however. Very lethargic, opening eyes when asked but unable to maintain eye opening.       General Comments General comments (skin integrity, edema, etc.): Pt's family reporting pt having difficulty swallowing. Discussed SLP consult with family and notified RN to place consult.     Exercises     Assessment/Plan    PT Assessment Patient needs continued PT services  PT Problem List Decreased strength;Decreased activity tolerance;Decreased balance;Decreased mobility;Decreased coordination;Decreased cognition;Decreased knowledge of use of DME;Decreased safety awareness;Decreased knowledge of precautions       PT Treatment Interventions DME instruction;Gait training;Functional mobility training;Therapeutic activities;Therapeutic exercise;Balance training;Neuromuscular re-education;Patient/family education;Cognitive remediation    PT Goals (Current goals can be found in the Care Plan section)  Acute Rehab PT Goals Patient Stated Goal: sister would like for pt to get better PT Goal Formulation: With family Time For Goal Achievement: 09/01/16 Potential to Achieve Goals: Fair    Frequency Min 2X/week   Barriers to discharge        Co-evaluation               AM-PAC PT "6 Clicks" Daily Activity  Outcome Measure Difficulty turning over in bed (including adjusting bedclothes, sheets and blankets)?: Total Difficulty moving from lying on back to sitting on the side of the bed? : Total Difficulty sitting down on and standing up from a chair with arms (e.g., wheelchair, bedside commode, etc,.)?: Total Help needed moving to and from a bed to chair (including a wheelchair)?: Total Help needed walking in hospital room?: Total Help needed climbing 3-5 steps with a railing? : Total 6 Click Score: 6    End of Session   Activity Tolerance: Patient limited by  lethargy Patient left: in bed;with call bell/phone within reach;with bed alarm set;with family/visitor present Nurse Communication: Mobility status PT Visit Diagnosis: Other abnormalities of gait and mobility (R26.89);Muscle weakness (generalized) (M62.81);Difficulty in walking, not elsewhere classified (R26.2)    Time: 1115-1140 PT Time Calculation (min) (ACUTE ONLY): 25 min   Charges:   PT Evaluation $PT Eval Moderate Complexity: 1 Procedure PT Treatments $Therapeutic Activity: 8-22 mins   PT G Codes:        Margot Chimes, PT, DPT  Acute Rehabilitation Services  Pager: [phone]   Melvyn Novas 08/18/2016, 12:11 PM

## 2016-08-18 NOTE — Progress Notes (Signed)
CRITICAL VALUE ALERT  Critical value received:  K 2.4  Date of notification:  08/18/2016  Time of notification:  0514  Critical value read back:Yes.    Nurse who received alert:  Celene Squibb RN  MD notified (1st page):  NP Craige Cotta  Time of first page:  [phone]  Responding MD:  NP Craige Cotta  Time MD responded:  [phone]  IV potassium replacement

## 2016-08-18 NOTE — Progress Notes (Signed)
Subjective/Chief Complaint: CC:  Weakness, nausea  Patient is very lethargic this morning - arousable with stimulation Appreciate TRH assistance with her work-up and management Patient is refusing to take PO's.  Reports that her mouth hurts No abdominal pain No vomiting reported Potassium remains low Temp of 103 seems to be an outlier - no other fever recorded and WBC normal   Objective: Vital signs in last 24 hours: Temp:  [97.5 F (36.4 C)-103 F (39.4 C)] 98.5 F (36.9 C) (05/03 0611) Pulse Rate:  [71-96] 77 (05/03 0611) Resp:  [16-20] 18 (05/03 0611) BP: (117-142)/(59-82) 128/82 (05/03 0611) SpO2:  [98 %-100 %] 99 % (05/03 0611) Last BM Date:  (PTA)  Intake/Output from previous day: 05/02 0701 - 05/03 0700 In: 2806.1 [P.O.:240; I.V.:1447.9; IV Piggyback:1118.2] Out: 500 [Urine:500] Intake/Output this shift: No intake/output data recorded.  Cachectic appearing - NAD Eyes:  Eyes closed but able to open with stimulation; Pupils equal, round; sclera anicteric HENT:  Oral mucosa mildly erythematous; good dentition; no visible mucosal sores Neck:  No masses palpated, no thyromegaly Lungs:  CTA bilaterally; normal respiratory effort CV:  Regular rate and rhythm; no murmurs; extremities well-perfused with no edema Abd:  +bowel sounds, soft, scaphoid; no obvious tenderness Skin:  Warm, dry; no sign of jaundice Psychiatric -  calm mood and affect   Lab Results:   Recent Labs  08/17/16 0727 08/18/16 0310  WBC 6.8 7.2  HGB 12.7 11.6*  HCT 37.1 34.7*  PLT 209 206   BMET  Recent Labs  08/17/16 1906 08/18/16 0310  NA 139 136  K 2.6* 2.4*  CL 100* 96*  CO2 26 25  GLUCOSE 137* 134*  BUN 14 11  CREATININE 0.73 0.70  CALCIUM 8.0* 7.5*   Hepatic Function Latest Ref Rng & Units 08/18/2016 08/17/2016 08/13/2016  Total Protein 6.5 - 8.1 g/dL 5.5(L) 6.1(L) 6.9  Albumin 3.5 - 5.0 g/dL 3.3(L) 3.4(L) 4.1  AST 15 - 41 U/L 21 20 23   ALT 14 - 54 U/L 12(L) 13(L) 12(L)  Alk  Phosphatase 38 - 126 U/L 66 70 80  Total Bilirubin 0.3 - 1.2 mg/dL 1.4(H) 2.0(H) 2.0(H)    PT/INR No results for input(s): LABPROT, INR in the last 72 hours. ABG No results for input(s): PHART, HCO3 in the last 72 hours.  Invalid input(s): PCO2, PO2  Studies/Results: No results found.  Anti-infectives: Anti-infectives    Start     Dose/Rate Route Frequency Ordered Stop   08/17/16 0641  ceFAZolin (ANCEF) IVPB 2g/100 mL premix  Status:  Discontinued     2 g 200 mL/hr over 30 Minutes Intravenous On call to O.R. 08/17/16 1937 08/17/16 0829      Assessment/Plan: Symptomatic cholelithiasis - was scheduled for surgery yesterday to remove gallbladder but case was canceled for hypokalemia.  Patient appears very different than at her initial consultation in the office.  Lethargic, decreased mental status, weak; refusing to take PO's  Work-up ongoing with TRH. Hypokalemia - likely due to thiazide diuretics; attempting to replete.  Patient is refusing PO potassium Altered mental status - will obtain head CT Lethargy - minimize anxiolytics, pain meds Oral mucosa tenderness -magic mouthwash  It is not clear to me what is causing her significant change in functional and nutritional status.  The nausea could be due to her gallbladder, but she is not currently in a safe condition for cholecystectomy.  TRH is obtaining metabolic work-up.  Will rule out intracranial source of mental status changes.  LOS: 1  day    Digna Countess K. 08/18/2016

## 2016-08-18 NOTE — Evaluation (Signed)
Occupational Therapy Evaluation Patient Details Name: Colleen Davis MRN: [id number] DOB: 12-Sep-1945 Today's Date: 08/18/2016    History of Present Illness 71 year old female who presents with a one-month history of persistent nausea and vomiting as well as weight loss. PMHx: Anxiety, CKD, Depression, Hyperipidemia, HTN.   Clinical Impression   Per sister, pt was independent with ADL and mobility up until ~1 week ago. Currently pt max assist overall for ADL and bed mobility. Pt incredibly lethargic this session; presenting with impaired cognition, poor balance, generalized weakness, and decreased activity tolerance impacting her independence and safety with ADL and functional mobility. Recommending SNF for follow up to maximize independence and safety with ADL and functional mobility prior to return home alone. Pt would benefit from continued skilled OT to address established goals.    Follow Up Recommendations  SNF;Supervision/Assistance - 24 hour    Equipment Recommendations  Other (comment) (TBD at next venue)    Recommendations for Other Services PT consult     Precautions / Restrictions Precautions Precautions: Fall Restrictions Weight Bearing Restrictions: No      Mobility Bed Mobility Overal bed mobility: Needs Assistance Bed Mobility: Supine to Sit;Sit to Supine     Supine to sit: Max assist Sit to supine: Max assist   General bed mobility comments: Pt lifting legs back into bed but requires assist due to weakness. Assist for trunk elevation and scooting hips out to EOB.  Transfers                 General transfer comment: Not assessed at this time.    Balance Overall balance assessment: Needs assistance Sitting-balance support: Feet supported;No upper extremity supported Sitting balance-Leahy Scale: Poor Sitting balance - Comments: Mod assist to maintain sitting balance                                   ADL either performed or assessed  with clinical judgement   ADL Overall ADL's : Needs assistance/impaired       Grooming Details (indicate cue type and reason): Pt able to wipe mouth with tissue, mod assist for sitting balance                               General ADL Comments: Currently requires max assist for all ADL; pt very lethargic and with limited ability to follow commands. Unsafe to attempt transfers at this time.     Vision   Additional Comments: Difficult to assess due to impaired communication/cognition. Appears to have disconjugate gaze when briefly opens eyes. Sister reports pt has been c/o double/triple vision in the past week.      Perception     Praxis      Pertinent Vitals/Pain Pain Assessment: No/denies pain     Hand Dominance     Extremity/Trunk Assessment Upper Extremity Assessment Upper Extremity Assessment: Generalized weakness   Lower Extremity Assessment Lower Extremity Assessment: Defer to PT evaluation   Cervical / Trunk Assessment Cervical / Trunk Assessment: Kyphotic   Communication Communication Communication: Other (comment) (slurred speech, lethargic)   Cognition Arousal/Alertness: Lethargic Behavior During Therapy: Flat affect Overall Cognitive Status: Impaired/Different from baseline Area of Impairment: Following commands                       Following Commands: Follows one step commands inconsistently;Follows one step commands with increased  time       General Comments: Pt responding to name, initially reports she is in [place] but then corrects to [place]. Very lethargic and not opening eyes with stimulus.   General Comments       Exercises     Shoulder Instructions      Home Living Family/patient expects to be discharged to:: Private residence Living Arrangements: Alone Available Help at Discharge: Family (sister has been staying for the past week) Type of Home: Mobile home Home Access: Ramped entrance     Home Layout:  One level     Bathroom Shower/Tub: IT trainer: Standard     Home Equipment: None   Additional Comments: Sister present and providing information      Prior Functioning/Environment Level of Independence: Independent        Comments: Pt has been independent with ADL/mobility up until ~1 week ago, she has had increased weakness and sister has been providing assist        OT Problem List: Decreased strength;Decreased activity tolerance;Impaired balance (sitting and/or standing);Impaired vision/perception;Decreased cognition;Decreased safety awareness;Decreased knowledge of use of DME or AE      OT Treatment/Interventions: Self-care/ADL training;Therapeutic exercise;Energy conservation;DME and/or AE instruction;Therapeutic activities;Patient/family education;Balance training;Cognitive remediation/compensation    OT Goals(Current goals can be found in the care plan section) Acute Rehab OT Goals Patient Stated Goal: sister would like for pt to get better OT Goal Formulation: With family Time For Goal Achievement: 09/01/16 Potential to Achieve Goals: Fair ADL Goals Pt Will Perform Eating: with min guard assist;sitting Pt Will Perform Grooming: with min guard assist;sitting (x2 tasks) Pt Will Transfer to Toilet: with min assist;stand pivot transfer;bedside commode Additional ADL Goal #1: Pt will follow one step command 75% of the time in a non distracting environment. Additional ADL Goal #2: Pt will perform bed mobility with min assist as precursor to ADL.  OT Frequency: Min 2X/week   Barriers to D/C: Decreased caregiver support  pt lives alone       Co-evaluation              AM-PAC PT "6 Clicks" Daily Activity     Outcome Measure Help from another person eating meals?: Total Help from another person taking care of personal grooming?: A Lot Help from another person toileting, which includes using toliet, bedpan, or urinal?: Total Help  from another person bathing (including washing, rinsing, drying)?: Total Help from another person to put on and taking off regular upper body clothing?: Total Help from another person to put on and taking off regular lower body clothing?: Total 6 Click Score: 7   End of Session Nurse Communication: Mobility status  Activity Tolerance: Patient limited by lethargy Patient left: in bed;with call bell/phone within reach;with nursing/sitter in room;with family/visitor present  OT Visit Diagnosis: Unsteadiness on feet (R26.81);Muscle weakness (generalized) (M62.81)                Time: 4098-1191 OT Time Calculation (min): 14 min Charges:  OT General Charges $OT Visit: 1 Procedure OT Evaluation $OT Eval Moderate Complexity: 1 Procedure G-Codes:     Theresa Dohrman A. Brett Albino, M.S., OTR/L Pager: [phone]  Gaye Alken 08/18/2016, 9:59 AM

## 2016-08-18 NOTE — Progress Notes (Signed)
  Echocardiogram 2D Echocardiogram has been performed.  Colleen Davis 08/18/2016, 4:25 PM

## 2016-08-18 NOTE — Progress Notes (Addendum)
Initial Nutrition Assessment  DOCUMENTATION CODES:   Severe malnutrition in context of acute illness/injury, Underweight  INTERVENTION:    Continue Boost Breeze po TID, each supplement provides 250 kcal and 9 grams of protein   Consider nasojejunal feeding tube placement (CORTRAK) for nutritional support  NUTRITION DIAGNOSIS:   Malnutrition (severe) related to acute illness (cholelithiasis) as evidenced by severe depletion of body fat, severe depletion of muscle mass, energy intake < or equal to 50% for > or equal to 1 month and 18% weight loss x 1 month  GOAL:   Patient will meet greater than or equal to 90% of their needs  MONITOR:   Diet advancement, Labs, Weight trends, I & O's, Nutrition support initiation  REASON FOR ASSESSMENT:   Consult Assessment of nutrition requirement/status  ASSESSMENT:   71 yo Female who presented with a one-month history of persistent nausea and vomiting as well as weight loss. PMHx: Anxiety, CKD, Depression, Hyperipidemia, HTN.  RD spoke with pt's daughter-in-law and sister at bedside. Reports pt has not eaten anything in the last 2 months. Also reveal pt has had persistent nausea, vomiting and mouth/tongue pain.   Pt has lost about 20 lbs x 1 month (18%).  Severe for time frame. Medications reviewed and include folic acid, Zofran and Phenergan.  Labs reviewed.  Potassium 2.4 (L).  Phosphorus 5.4 (H).  Nutrition-Focused physical exam completed. Findings are severe fat depletion, severe muscle depletion, and no edema.   Diet Order:  Diet full liquid Room service appropriate? Yes; Fluid consistency: Thin  Skin:  Reviewed, no issues  Last BM:  PTA  Height:   Ht Readings from Last 1 Encounters:  08/17/16 5' 2.5" (1.588 m)   Weight:   Wt Readings from Last 1 Encounters:  08/17/16 90 lb (40.8 kg)   Ideal Body Weight:  50 kg  BMI:  Body mass index is 16.2 kg/m.  Estimated Nutritional Needs:   Kcal:  1200-1400  Protein:   60-70 gm  Fluid:  >/= 1.5 L  EDUCATION NEEDS:   No education needs identified at this time  Maureen Chatters, RD, LDN Pager #: [phone] After-Hours Pager #: [phone]

## 2016-08-18 NOTE — Progress Notes (Signed)
Patient refused Potassium PO. Hold Ativan IV due to condition. MD made aware. Will continue to monitor.

## 2016-08-18 NOTE — Progress Notes (Addendum)
PROGRESS NOTE    Colleen Davis  ZOX:[id number] DOB: [birth year] DOA: 08/17/2016 PCP: Lupe Carney, MD  Brief Narrative:Colleen Davis is a 71 y.o. female with medical history significant for hypertension on ACE inhibitor/thiazide diuretic combination, dyslipidemia and hypertension. Patient was scheduled to have elective cholecystectomy in the setting of symptomatic cholelithiasis today. Unfortunately her preoperative potassium was low at 2.4 therefore surgery was canceled. patient has been having ongoing GI issues since November 2017. She has had a 40 pound weight loss since onset of symptoms. Symptoms are primarily related to painful inflammation of the mouth and initially started as ulcers, persistent nausea, and crampy abdominal pain after eating. Patient has not had any blood in her stools or emesis. The emesis is reported as green and bilious. Over the past 30 days patient has become weak and if she is unable to ambulate and has required a wheelchair for mobility. She complains of blurred vision especially with standing. She has not been sleeping well especially over the past 2 weeks.    Assessment & Plan:   Principal Problem:   Hypokalemia -severe due to diuretics/poor Po intake likely -Replace IV, and flushing patient refuses to take this PO -Phosphorus replaced   Lethargy/encephalopathy -suspect due to IV lorazepam, takes xanax PRN at home -change to PRN, CT head ordered by [facility]    Nausea and Vomiting -Suspect to be related to symptomatic cholelithiasis -Has had a GI workup recently with EGD/colonoscopy which were unremarkable -check HIV, check Am Cortisol -TSH/RPR benign -CT Abd pelvis from 3/30 with Mild submucosal fatty deposition within cecum ascending colon without acute bowel inflammation and Cholelithiasis with gallbladder distention    HTN (hypertension) -HCTZ stopped  Tachycardia -TSH mildly low, T4 ok -check ECHO   Hyperglycemia -check hba1c, takes thiazide  which can cause some hyperglycemia    Symptomatic cholelithiasis -as above    Severe protein-calorie malnutrition (HCC) -due to persistent nausea and vomiting/above -may need TPN  DVT prophylaxis:lovenox Code Status: Full Code Family Communication: None at bedside Disposition Plan: Home when improved    Subjective: Sleepy this morning, got some IV Ativan last night, refuses to take oral potassium  Objective: Vitals:   08/17/16 2132 08/18/16 0003 08/18/16 0146 08/18/16 0611  BP: (!) 142/72 136/77 125/69 128/82  Pulse: 77 84 96 77  Resp: 18  20 18   Temp: 97.5 F (36.4 C)  98.2 F (36.8 C) 98.5 F (36.9 C)  TempSrc: Oral  Oral   SpO2: 100%  100% 99%  Weight:      Height:        Intake/Output Summary (Last 24 hours) at 08/18/16 1145 Last data filed at 08/18/16 8119  Gross per 24 hour  Intake           2566.1 ml  Output              500 ml  Net           2066.1 ml   Filed Weights   08/17/16 0627 08/17/16 0652  Weight: 40.8 kg (90 lb) 40.8 kg (90 lb)    Examination:  General exam: Frail thin built Caucasian female laying in bed, somnolent, arouses with verbal stimuli  Respiratory system: Clear to auscultation. Respiratory effort normal. Cardiovascular system: S1 & S2 heard, RRR. No JVD, murmurs, rubs, gallops or clicks. No pedal edema. Gastrointestinal system: Abdomen is nondistended, soft and nontender.  Normal bowel sounds heard. Central nervous system: Somnolent No focal neurological deficits. Extremities: Symmetric 5 x 5 power.  Skin: No rashes, lesions or ulcers Psychiatry: Unable to assess    Data Reviewed:   CBC:  Recent Labs Lab 08/13/16 0813 08/13/16 0900 08/15/16 1421 08/17/16 0656 08/17/16 0727 08/18/16 0310  WBC SPECIMEN CLOTTED NOTIFIED LISA AT 0850 ON 161096 BY HOOKER,B 8.5 8.5  --  6.8 7.2  NEUTROABS PENDING 7.6  --   --   --  5.8  HGB SPECIMEN CLOTTED NOTIFIED LISA AT 0850 ON 042818 BY HOOKER,B 12.9 13.6 12.6 12.7 11.6*  HCT SPECIMEN  CLOTTED NOTIFIED LISA AT 0850 ON 042818 BY HOOKER,B 36.5 39.6 37.0 37.1 34.7*  MCV SPECIMEN CLOTTED NOTIFIED LISA AT 0850 ON 042818 BY HOOKER,B 90.3 90.6  --  90.9 90.1  PLT SPECIMEN CLOTTED NOTIFIED LISA AT 0850 ON 042818 BY HOOKER,B 206 285  --  209 206   Basic Metabolic Panel:  Recent Labs Lab 08/13/16 0813 08/15/16 1421 08/17/16 0656 08/17/16 0727 08/17/16 0927 08/17/16 1906 08/18/16 0310  NA 137 139 143 143  --  139 136  K 2.8* 2.5* 2.4* 2.4*  --  2.6* 2.4*  CL 98* 100*  --  103  --  100* 96*  CO2 23 22  --  25  --  26 25  GLUCOSE 125* 124* 117* 113*  --  137* 134*  BUN 18 17  --  17  --  14 11  CREATININE 0.88 0.93  --  0.90  --  0.73 0.70  CALCIUM 9.2 9.1  --  8.8*  --  8.0* 7.5*  MG  --   --   --   --  1.9  --   --   PHOS  --   --   --   --  2.2*  --  5.4*   GFR: Estimated Creatinine Clearance: 42.7 mL/min (by C-G formula based on SCr of 0.7 mg/dL). Liver Function Tests:  Recent Labs Lab 08/13/16 0813 08/17/16 0727 08/18/16 0310  AST 23 20 21   ALT 12* 13* 12*  ALKPHOS 80 70 66  BILITOT 2.0* 2.0* 1.4*  PROT 6.9 6.1* 5.5*  ALBUMIN 4.1 3.4* 3.3*    Recent Labs Lab 08/13/16 0813  LIPASE 30   No results for input(s): AMMONIA in the last 168 hours. Coagulation Profile: No results for input(s): INR, PROTIME in the last 168 hours. Cardiac Enzymes: No results for input(s): CKTOTAL, CKMB, CKMBINDEX, TROPONINI in the last 168 hours. BNP (last 3 results) No results for input(s): PROBNP in the last 8760 hours. HbA1C: No results for input(s): HGBA1C in the last 72 hours. CBG: No results for input(s): GLUCAP in the last 168 hours. Lipid Profile: No results for input(s): CHOL, HDL, LDLCALC, TRIG, CHOLHDL, LDLDIRECT in the last 72 hours. Thyroid Function Tests:  Recent Labs  08/17/16 0928 08/17/16 1224  TSH 0.336*  --   FREET4  --  1.12   Anemia Panel:  Recent Labs  08/17/16 0927 08/17/16 0928  VITAMINB12 303  --   FOLATE  --  3.0*  FERRITIN 148   --   TIBC 193*  --   IRON 44  --   RETICCTPCT 0.7  --    Urine analysis:    Component Value Date/Time   COLORURINE AMBER (A) 08/13/2016 1231   APPEARANCEUR HAZY (A) 08/13/2016 1231   LABSPEC 1.020 08/13/2016 1231   PHURINE 6.0 08/13/2016 1231   GLUCOSEU NEGATIVE 08/13/2016 1231   HGBUR NEGATIVE 08/13/2016 1231   BILIRUBINUR SMALL (A) 08/13/2016 1231   KETONESUR 80 (A) 08/13/2016 1231  PROTEINUR 30 (A) 08/13/2016 1231   UROBILINOGEN 1.0 11/16/2011 2141   NITRITE NEGATIVE 08/13/2016 1231   LEUKOCYTESUR LARGE (A) 08/13/2016 1231   Sepsis Labs: @LABRCNTIP (procalcitonin:4,lacticidven:4)  )No results found for this or any previous visit (from the past 240 hour(s)).       Radiology Studies: No results found.      Scheduled Meds: . enalaprilat  0.625 mg Intravenous Q6H  . enoxaparin (LOVENOX) injection  30 mg Subcutaneous Daily  . feeding supplement  1 Container Oral TID BM  . folic acid  1 mg Intravenous Daily  . magic mouthwash  10 mL Oral QID  . pantoprazole (PROTONIX) IV  40 mg Intravenous QHS   Continuous Infusions: . 0.9 % NaCl with KCl 20 mEq / L 125 mL/hr at 08/18/16 0356  . potassium chloride 10 mEq (08/18/16 1102)  . potassium chloride       LOS: 1 day    Time spent:   Zannie Cove, MD [facility] Pager [phone]  If 7PM-7AM, please contact night-coverage [email or web address] Password [email or web address] 08/18/2016, 11:45 AM

## 2016-08-19 DIAGNOSIS — R5383 Other fatigue: Secondary | ICD-10-CM

## 2016-08-19 LAB — BASIC METABOLIC PANEL
Anion gap: 8 (ref 5–15)
BUN: 7 mg/dL (ref 6–20)
CALCIUM: 7.7 mg/dL — AB (ref 8.9–10.3)
CO2: 20 mmol/L — AB (ref 22–32)
CREATININE: 0.65 mg/dL (ref 0.44–1.00)
Chloride: 106 mmol/L (ref 101–111)
GFR calc non Af Amer: >60 mL/min (ref >60)
GLUCOSE: 91 mg/dL (ref 65–99)
Potassium: 5 mmol/L (ref 3.5–5.1)
Sodium: 134 mmol/L — ABNORMAL LOW (ref 135–145)

## 2016-08-19 LAB — VITAMIN B12: Vitamin B-12: 267 pg/mL (ref 180–914)

## 2016-08-19 LAB — T3: T3, Total: 60 ng/dL — ABNORMAL LOW (ref 71–180)

## 2016-08-19 MED ORDER — SODIUM CHLORIDE 0.9 % IV SOLN
INTRAVENOUS | Status: DC
  Filled 2016-08-19: qty 1000

## 2016-08-19 MED ORDER — CARVEDILOL 3.125 MG PO TABS
3.1250 mg | ORAL_TABLET | Freq: Two times a day (BID) | ORAL | Status: DC
  Administered 2016-08-22 – 2016-08-25 (×6): 3.125 mg via ORAL
  Filled 2016-08-19 (×11): qty 1

## 2016-08-19 NOTE — Progress Notes (Signed)
2 Days Post-Op   Subjective/Chief Complaint: Weakness, nausea  Patient is more awake, alert, but still confused today Talking to her family about eating "chocolate cake", which is a slight improvement over yesterday The family reports that they have discovered that the back of her trailer is covered in black mold.  They experienced breathing difficulties and developed sores in their mouth after just a brief exposure.  This may be contributing to her current health issues.   Objective: Vital signs in last 24 hours: Temp:  [97.9 F (36.6 C)-98.1 F (36.7 C)] 97.9 F (36.6 C) (05/04 0518) Pulse Rate:  [75-121] 111 (05/04 0518) Resp:  [17-18] 18 (05/04 0518) BP: (97-121)/(79-86) 97/79 (05/04 0518) SpO2:  [91 %-100 %] 100 % (05/04 0518) Last BM Date:  (PTA)  Intake/Output from previous day: 05/03 0701 - 05/04 0700 In: 1733.8 [P.O.:120; I.V.:1213.8; IV Piggyback:400] Out: -  Intake/Output this shift: No intake/output data recorded.  General appearance: eyes open, responsive, but speaks in disoriented fashion GI: soft, non-tender; bowel sounds normal; no masses,  no organomegaly  Lab Results:   Recent Labs  08/17/16 0727 08/18/16 0310  WBC 6.8 7.2  HGB 12.7 11.6*  HCT 37.1 34.7*  PLT 209 206   BMET  Recent Labs  08/18/16 0310 08/19/16 0800  NA 136 134*  K 2.4* 5.0  CL 96* 106  CO2 25 20*  GLUCOSE 134* 91  BUN 11 7  CREATININE 0.70 0.65  CALCIUM 7.5* 7.7*   PT/INR No results for input(s): LABPROT, INR in the last 72 hours. ABG No results for input(s): PHART, HCO3 in the last 72 hours.  Invalid input(s): PCO2, PO2  Studies/Results: Ct Head Wo Contrast  Result Date: 08/18/2016 CLINICAL DATA:  Patient is nonverbal.  Confusion and lethargy. EXAM: CT HEAD WITHOUT CONTRAST TECHNIQUE: Contiguous axial images were obtained from the base of the skull through the vertex without intravenous contrast. COMPARISON:  November 16, 2011 FINDINGS: Brain: No subdural, epidural, or  subarachnoid hemorrhage. No mass, mass effect or midline shift. Ventricles and sulci are stable. Cerebellum, brainstem, and basal cisterns are normal. Mild white matter changes. No acute cortical ischemia or infarct. No other acute abnormalities. Vascular: No hyperdense vessel or unexpected calcification. Skull: Normal. Negative for fracture or focal lesion. Sinuses/Orbits: No acute finding. Other: None. IMPRESSION: No acute intracranial abnormality. Electronically Signed   By: Gerome Sam III M.D   On: 08/18/2016 16:34    Anti-infectives: Anti-infectives    Start     Dose/Rate Route Frequency Ordered Stop   08/17/16 0641  ceFAZolin (ANCEF) IVPB 2g/100 mL premix  Status:  Discontinued     2 g 200 mL/hr over 30 Minutes Intravenous On call to O.R. 08/17/16 4098 08/17/16 0829      Assessment/Plan: Symptomatic cholelithiasis - was scheduled for surgery 08/17/16 to remove gallbladder but case was canceled for hypokalemia.  Patient appears very different than at her initial consultation in the office.  Lethargic, decreased mental status, weak; refusing to take PO's  Work-up ongoing with [facility]. Hypokalemia - improved after aggressive IV repletion Altered mental status - negative head CT Lethargy - minimize anxiolytics, pain meds Oral mucosa tenderness -magic mouthwash  Discussed black mold issue with [facility] - Dr. Jomarie Longs.  Uncertain of significance at this time, but this certainly could be contributing to her chronic medical condition.  It is not clear to me what is causing her significant change in functional and nutritional status.  The nausea could be due to her gallbladder, but  she is not currently in a safe condition for cholecystectomy.  [facility] is obtaining metabolic work-up.    Dr. Jomarie Longs and Darcel Smalling will continue to evaluate and manage her through the weekend.  If she improves and is ready for surgery next week, I will try to rearrange my schedule to perform her lap chole this admission. Transfer  to Dr. Edison Simon service.   LOS: 2 days    Vang Kraeger K. 08/19/2016

## 2016-08-19 NOTE — Evaluation (Signed)
Clinical/Bedside Swallow Evaluation Patient Details  Name: Colleen Davis MRN: [id number] Date of Birth: 1945-12-25  Today's Date: 08/19/2016 Time: SLP Start Time (ACUTE ONLY): 1516 SLP Stop Time (ACUTE ONLY): 1530 SLP Time Calculation (min) (ACUTE ONLY): 14 min  Past Medical History:  Past Medical History:  Diagnosis Date  . Anxiety   . Chronic kidney disease    polycystic  . Dehydration    recently hosp  . Depression   . History of kidney stones    cyst on kidney  . Hyperlipidemia   . Hypertension   . Hypokalemia    Past Surgical History:  Past Surgical History:  Procedure Laterality Date  . DILATION AND CURETTAGE OF UTERUS    . TUBAL LIGATION     HPI:  71 year old female who presents with a one-month history of persistent nausea and vomiting as well as weight loss. Found to have gallstonnes. PMHx: Anxiety, CKD, Depression, Hyperipidemia, HTN   Assessment / Plan / Recommendation Clinical Impression  Pt very confused, verbal expression mostly nonsensical, not following most commands. Attempted oral hygiene and pt resistent. Thin water via cup spilled from oral cavity and no response with straw. Pt not appropriate/safe for po's at present due to cognitive impairments. Pt needs to demonstrate increased alertness and awareness. RN updated. Will continue to follow.  SLP Visit Diagnosis: Dysphagia, unspecified (R13.10)    Aspiration Risk  Moderate aspiration risk    Diet Recommendation NPO        Other  Recommendations Oral Care Recommendations: Oral care QID   Follow up Recommendations  (TBD)      Frequency and Duration min 2x/week  2 weeks       Prognosis Prognosis for Safe Diet Advancement: Good      Swallow Study   General HPI: 71 year old female who presents with a one-month history of persistent nausea and vomiting as well as weight loss. Found to have gallstonnes. PMHx: Anxiety, CKD, Depression, Hyperipidemia, HTN Type of Study: Bedside Swallow  Evaluation Previous Swallow Assessment:  (none) Diet Prior to this Study: Thin liquids (full liquids) Temperature Spikes Noted: No Respiratory Status: Room air History of Recent Intubation: No Behavior/Cognition: Confused;Requires cueing Oral Cavity Assessment:  (unable to fully assess) Oral Care Completed by SLP:  (attempted) Oral Cavity - Dentition:  (lower teeth ground down. need to assess further) Vision: Functional for self-feeding Self-Feeding Abilities: Total assist Patient Positioning: Upright in bed Baseline Vocal Quality: Normal Volitional Cough: Weak Volitional Swallow: Unable to elicit    Oral/Motor/Sensory Function Overall Oral Motor/Sensory Function:  (no focal weakness, assess when pt cooperative)   [place] chips: Not tested   Thin Liquid Thin Liquid: Impaired Presentation: Cup Oral Phase Functional Implications: Right anterior spillage;Left anterior spillage Pharyngeal  Phase Impairments:  (no swallow initiated)    Nectar Thick Nectar Thick Liquid: Not tested   Honey Thick Honey Thick Liquid: Not tested   Puree Puree:  (pt refused)   Solid   GO   Solid: Not tested        Royce Macadamia 08/19/2016,5:06 PM   Breck Coons Lon[place] [employer] [phone]

## 2016-08-19 NOTE — Clinical Social Work Note (Signed)
Clinical Social Work Assessment  Patient Details  Name: Colleen Davis MRN: [id number] Date of Birth: [birth year]  Date of referral:  08/19/16               Reason for consult:  Facility Placement, Discharge Planning                Permission sought to share information with:  Facility Medical sales representative, Family Supports Permission granted to share information::  Yes, Verbal Permission Granted  Name::     Company secretary::  SNF  Relationship::  Sister  Contact Information:     Housing/Transportation Living arrangements for the past 2 months:  Mobile Home Source of Information:  Other (Comment Required) (Sister) Patient Interpreter Needed:  None Criminal Activity/Legal Involvement Pertinent to Current Situation/Hospitalization:  No - Comment as needed Significant Relationships:  Siblings, Adult Children Lives with:  Self Do you feel safe going back to the place where you live?  No Need for family participation in patient care:  Yes (Comment) (pt disoriented and confused)  Care giving concerns:  Pt currently lives home alone with no supports. Pt has become severely weak and malnourished due to illness and will need more support than currently available within home. Pt will need SNF placement in order to recover strength before returning home.   Social Worker assessment / plan:  CSW explained SNF recommendation to pt's sister. Pt's sister expressed concern about the pt's current living arrangement, describing how the mobile home that the pt lives in is in some state of disrepair and may have some possible mold issues. Pt's sister agreed with recommendation for SNF placement, and acknowledged that the pt's family had been thinking about placement in an ALF after discharge from SNF. CSW explained that the SNF would be able to assist with transition to ALF after rehab completed. CSW agreed to provide local resources for ALF possibilities to pt's sister for the future.   Employment status:   Retired Database administrator PT Recommendations:  Skilled Nursing Facility Information / Referral to community resources:     Patient/Family's Response to care:  Pt's sister was agreeable to placement in SNF.  Patient/Family's Understanding of and Emotional Response to Diagnosis, Current Treatment, and Prognosis:  Pt was confused and disoriented, so emotional response to treatment was unable to be assessed. Pt's sister seemed upset and concerned about the severe state of the pt, but was hopeful that she seemed like she would recover some of her health. Pt's sister seemed overwhelmed with everything going on, and was appreciative of assistance.  Emotional Assessment Appearance:  Appears stated age Attitude/Demeanor/Rapport:  Unable to Assess Affect (typically observed):    Orientation:   (disoriented) Alcohol / Substance use:  Not Applicable Psych involvement (Current and /or in the community):  No (Comment)  Discharge Needs  Concerns to be addressed:  Care Coordination, Discharge Planning Concerns Readmission within the last 30 days:  No Current discharge risk:  Physical Impairment Barriers to Discharge:  Continued Medical Work up   [employer], LCSW 08/19/2016, 12:08 PM

## 2016-08-19 NOTE — Progress Notes (Addendum)
PROGRESS NOTE    Colleen Davis  QIO:[id number] DOB: Mar 20, 1946 DOA: 08/17/2016 PCP: Donnie Coffin, MD  Brief Narrative:Colleen Davis is a 71 y.o. female with medical history significant for hypertension on ACE inhibitor/thiazide diuretic combination, dyslipidemia and hypertension. Patient was scheduled to have elective cholecystectomy in the setting of symptomatic cholelithiasis today. Unfortunately her preoperative potassium was low at 2.4 therefore surgery was canceled. patient has been having ongoing GI issues since November 2017. She has had a 40 pound weight loss since onset of symptoms. Symptoms are primarily related to painful inflammation of the mouth and initially started as ulcers, persistent nausea, and crampy abdominal pain after eating. Patient has not had any blood in her stools or emesis. The emesis is reported as green and bilious. Over the past 30 days patient has become weak and if she is unable to ambulate and has required a wheelchair for mobility. She complains of blurred vision especially with standing. She has not been sleeping well especially over the past 2 weeks.    Assessment & Plan:   Principal Problem:   Hypokalemia -severe due to diuretics/poor Po intake  -Replaced IV, patient refused to take this PO -Phosphorus replaced, Mag ok -K 5 today, recheck tomorrow and start daily replacement if needed   Lethargy/encephalopathy -sedating meds cut down, was on QHS PRN lorazepam instead of xanax HS at home, I have stopped this too -CT head unremarkable -TSH unremarkable, HIV negative/RPR negative -check B12 and folate -consider MRI brain if no improvement in mentation    Nausea and Vomiting -Suspect to be related to symptomatic cholelithiasis -Has had a GI workup recently with EGD  Unremarkable except for gastritis, unable to have colonoscopy -HIV negative, ESR 20 -CT Abd pelvis from 3/30 with Mild submucosal fatty deposition within cecum ascending colon without  acute bowel inflammation and Cholelithiasis with gallbladder distention, never had a colonoscopy    HTN (hypertension) -HCTZ stopped  Cardiomyopathy EF 35% -ECHO with wall motion abnormality -TSH mildly low, T4 ok -needs a cardiac workup when mentation and overall condition better   Hyperglycemia -check hba1c, takes thiazide which can cause some hyperglycemia    Symptomatic cholelithiasis -as above    Severe protein-calorie malnutrition (HCC) -due to persistent nausea and vomiting/above, prealbumin is 10 -may need TPN  DVT prophylaxis:lovenox Code Status: Full Code Family Communication: sister at bedside Disposition Plan: Home when improved    Subjective: More alert, but confused  Objective: Vitals:   08/18/16 1523 08/18/16 2142 08/19/16 0518 08/19/16 1350  BP: 121/86 115/81 97/79 122/83  Pulse: 75 (!) 121 (!) 111 (!) 116  Resp: 18 17 18 18   Temp:  98.1 F (36.7 C) 97.9 F (36.6 C) 98 F (36.7 C)  TempSrc:  Oral  Oral  SpO2: 91% 100% 100% 98%  Weight:      Height:        Intake/Output Summary (Last 24 hours) at 08/19/16 1517 Last data filed at 08/19/16 1007  Gross per 24 hour  Intake          1556.67 ml  Output                0 ml  Net          1556.67 ml   Filed Weights   08/17/16 0627 08/17/16 0652  Weight: 40.8 kg (90 lb) 40.8 kg (90 lb)    Examination:  General exam: Frail thin built Caucasian female laying in bed, more alert, but somewhat confused, doesn't answer my  questions but responds more appropriately to her sister Respiratory system: decreased BS at bases Cardiovascular system: S1 & S2 heard, RRR. No JVD, murmurs Gastrointestinal system: Abdomen is nondistended, soft and nontender.  Normal bowel sounds heard. Central nervous system: Somnolent No focal neurological deficits. Extremities: Symmetric 5 x 5 power. Skin: No rashes, lesions or ulcers Psychiatry: Unable to assess    Data Reviewed:   CBC:  Recent Labs Lab 08/13/16 0813  08/13/16 0900 08/15/16 1421 08/17/16 0656 08/17/16 0727 08/18/16 0310  WBC SPECIMEN CLOTTED NOTIFIED LISA AT 0850 ON [id number] BY HOOKER,B 8.5 8.5  --  6.8 7.2  NEUTROABS PENDING 7.6  --   --   --  5.8  HGB SPECIMEN CLOTTED NOTIFIED LISA AT 0850 ON 042818 BY HOOKER,B 12.9 13.6 12.6 12.7 11.6*  HCT SPECIMEN CLOTTED NOTIFIED LISA AT 0850 ON 042818 BY HOOKER,B 36.5 39.6 37.0 37.1 34.7*  MCV SPECIMEN CLOTTED NOTIFIED LISA AT 0850 ON 042818 BY HOOKER,B 90.3 90.6  --  90.9 90.1  PLT SPECIMEN CLOTTED NOTIFIED LISA AT 8466 ON 042818 BY ZLDJTT,S 177 939  --  209 030   Basic Metabolic Panel:  Recent Labs Lab 08/15/16 1421 08/17/16 0656 08/17/16 0727 08/17/16 0927 08/17/16 1906 08/18/16 0310 08/19/16 0800  NA 139 143 143  --  139 136 134*  K 2.5* 2.4* 2.4*  --  2.6* 2.4* 5.0  CL 100*  --  103  --  100* 96* 106  CO2 22  --  25  --  26 25 20*  GLUCOSE 124* 117* 113*  --  137* 134* 91  BUN 17  --  17  --  14 11 7   CREATININE 0.93  --  0.90  --  0.73 0.70 0.65  CALCIUM 9.1  --  8.8*  --  8.0* 7.5* 7.7*  MG  --   --   --  1.9  --   --   --   PHOS  --   --   --  2.2*  --  5.4*  --    GFR: Estimated Creatinine Clearance: 42.7 mL/min (by C-G formula based on SCr of 0.65 mg/dL). Liver Function Tests:  Recent Labs Lab 08/13/16 0813 08/17/16 0727 08/18/16 0310  AST 23 20 21   ALT 12* 13* 12*  ALKPHOS 80 70 66  BILITOT 2.0* 2.0* 1.4*  PROT 6.9 6.1* 5.5*  ALBUMIN 4.1 3.4* 3.3*    Recent Labs Lab 08/13/16 0813  LIPASE 30   No results for input(s): AMMONIA in the last 168 hours. Coagulation Profile: No results for input(s): INR, PROTIME in the last 168 hours. Cardiac Enzymes: No results for input(s): CKTOTAL, CKMB, CKMBINDEX, TROPONINI in the last 168 hours. BNP (last 3 results) No results for input(s): PROBNP in the last 8760 hours. HbA1C: No results for input(s): HGBA1C in the last 72 hours. CBG: No results for input(s): GLUCAP in the last 168 hours. Lipid Profile: No results  for input(s): CHOL, HDL, LDLCALC, TRIG, CHOLHDL, LDLDIRECT in the last 72 hours. Thyroid Function Tests:  Recent Labs  08/17/16 0928 08/17/16 1224  TSH 0.336*  --   FREET4  --  1.12   Anemia Panel:  Recent Labs  08/17/16 0927 08/17/16 0928  VITAMINB12 303  --   FOLATE  --  3.0*  FERRITIN 148  --   TIBC 193*  --   IRON 44  --   RETICCTPCT 0.7  --    Urine analysis:    Component Value Date/Time  COLORURINE AMBER (A) 08/13/2016 1231   APPEARANCEUR HAZY (A) 08/13/2016 1231   LABSPEC 1.020 08/13/2016 1231   PHURINE 6.0 08/13/2016 1231   GLUCOSEU NEGATIVE 08/13/2016 1231   HGBUR NEGATIVE 08/13/2016 1231   BILIRUBINUR SMALL (A) 08/13/2016 1231   KETONESUR 80 (A) 08/13/2016 1231   PROTEINUR 30 (A) 08/13/2016 1231   UROBILINOGEN 1.0 11/16/2011 2141   NITRITE NEGATIVE 08/13/2016 1231   LEUKOCYTESUR LARGE (A) 08/13/2016 1231   Sepsis Labs: @LABRCNTIP (procalcitonin:4,lacticidven:4)  ) Recent Results (from the past 240 hour(s))  Culture, Urine     Status: Abnormal   Collection Time: 08/17/16  8:30 AM  Result Value Ref Range Status   Specimen Description URINE, RANDOM  Final   Special Requests NONE  Final   Culture MULTIPLE SPECIES PRESENT, SUGGEST RECOLLECTION (A)  Final   Report Status 08/18/2016 FINAL  Final         Radiology Studies: Ct Head Wo Contrast  Result Date: 08/18/2016 CLINICAL DATA:  Patient is nonverbal.  Confusion and lethargy. EXAM: CT HEAD WITHOUT CONTRAST TECHNIQUE: Contiguous axial images were obtained from the base of the skull through the vertex without intravenous contrast. COMPARISON:  November 16, 2011 FINDINGS: Brain: No subdural, epidural, or subarachnoid hemorrhage. No mass, mass effect or midline shift. Ventricles and sulci are stable. Cerebellum, brainstem, and basal cisterns are normal. Mild white matter changes. No acute cortical ischemia or infarct. No other acute abnormalities. Vascular: No hyperdense vessel or unexpected calcification.  Skull: Normal. Negative for fracture or focal lesion. Sinuses/Orbits: No acute finding. Other: None. IMPRESSION: No acute intracranial abnormality. Electronically Signed   By: Dorise Bullion III M.D   On: 08/18/2016 16:34        Scheduled Meds: . carvedilol  3.125 mg Oral BID WC  . enalaprilat  0.625 mg Intravenous Q6H  . enoxaparin (LOVENOX) injection  30 mg Subcutaneous Daily  . feeding supplement  1 Container Oral TID BM  . folic acid  1 mg Intravenous Daily  . magic mouthwash  10 mL Oral QID  . pantoprazole (PROTONIX) IV  40 mg Intravenous QHS   Continuous Infusions:    LOS: 2 days    Time spent: 10mn   PDomenic Polite MD [facility] Pager [phone] If 7PM-7AM, please contact night-coverage [email or web address] Password [email or web address] 08/19/2016, 3:17 PM

## 2016-08-19 NOTE — Progress Notes (Signed)
PT Cancellation Note  Patient Details Name: AZIYAH METOXEN MRN: [id number] DOB: 06/27/1945   Cancelled Treatment:    Reason Eval/Treat Not Completed: Patient not medically ready Pt with new bedrest orders. Will hold PT until bedrest orders removed.   Margot Chimes, PT, DPT  Acute Rehabilitation Services  Pager: [phone]   Melvyn Novas 08/19/2016, 9:14 AM

## 2016-08-19 NOTE — NC FL2 (Signed)
[place] MEDICAID FL2 LEVEL OF CARE SCREENING TOOL     IDENTIFICATION  Patient Name: Colleen Davis Birthdate: Sep 08, 1945 Sex: female Admission Date (Current Location): 08/17/2016  [facility] and [place] Number:  Producer, television/film/video and Address:  [facility] [place], [address], [place], [place] [address]      Provider Number: [id number]  Attending Physician Name and Address:  Zannie Cove, MD  Relative Name and Phone Number:       Current Level of Care: Hospital Recommended Level of Care: Skilled Nursing Facility Prior Approval Number:    Date Approved/Denied:   PASRR Number: [id number]  Discharge Plan: SNF    Current Diagnoses: Patient Active Problem List   Diagnosis Date Noted  . Hypokalemia 08/17/2016  . Symptomatic cholelithiasis 08/17/2016  . Abnormal urinalysis 08/17/2016  . Mild concentric left ventricular hypertrophy (LVH) 08/17/2016  . Severe protein-calorie malnutrition (HCC) 08/17/2016  . Visual hallucinations 08/17/2016  . Syncope and collapse 11/17/2011  . HTN (hypertension) 11/17/2011  . Tinnitus of right ear 11/17/2011  . Hyperlipidemia 11/17/2011    Orientation RESPIRATION BLADDER Height & Weight      (disoriented)  Normal Incontinent (incontinent at times) Weight: 90 lb (40.8 kg) Height:  5' 2.5" (158.8 cm)  BEHAVIORAL SYMPTOMS/MOOD NEUROLOGICAL BOWEL NUTRITION STATUS      Continent    AMBULATORY STATUS COMMUNICATION OF NEEDS Skin   Extensive Assist Verbally (is mumbling) Normal                       Personal Care Assistance Level of Assistance  Bathing, Feeding, Dressing Bathing Assistance: Maximum assistance Feeding assistance: Maximum assistance Dressing Assistance: Maximum assistance     Functional Limitations Info             SPECIAL CARE FACTORS FREQUENCY  PT (By licensed PT), OT (By licensed OT)     PT Frequency: will assess OT Frequency: will assess            Contractures       Additional Factors Info  Allergies, Code Status Code Status Info: full Allergies Info: nka           Current Medications (08/19/2016):  This is the current hospital active medication list Current Facility-Administered Medications  Medication Dose Route Frequency Provider Last Rate Last Dose  . alum & mag hydroxide-simeth (MAALOX/MYLANTA) 200-200-20 MG/5ML suspension 15 mL  15 mL Oral Q6H PRN Manus Rudd, MD      . enalaprilat (VASOTEC) injection 0.625 mg  0.625 mg Intravenous Q6H Russella Dar, NP   0.625 mg at 08/19/16 1137  . enoxaparin (LOVENOX) injection 30 mg  30 mg Subcutaneous Daily Manus Rudd, MD   30 mg at 08/19/16 0953  . feeding supplement (BOOST / RESOURCE BREEZE) liquid 1 Container  1 Container Oral TID BM Russella Dar, NP   1 Container at 08/19/16 [phone]  . folic acid injection 1 mg  1 mg Intravenous Daily Russella Dar, NP   1 mg at 08/19/16 0953  . LORazepam (ATIVAN) injection 0.5 mg  0.5 mg Intravenous QHS PRN Zannie Cove, MD   0.5 mg at 08/18/16 2228  . magic mouthwash  10 mL Oral QID Russella Dar, NP   10 mL at 08/19/16 0953  . ondansetron (ZOFRAN) injection 4 mg  4 mg Intravenous Q6H PRN Russella Dar, NP      . ondansetron (ZOFRAN-ODT) disintegrating tablet 4 mg  4 mg Oral Q6H  PRN Manus Rudd, MD      . pantoprazole (PROTONIX) injection 40 mg  40 mg Intravenous QHS Manus Rudd, MD   40 mg at 08/18/16 2133     Discharge Medications: Please see discharge summary for a list of discharge medications.  Relevant Imaging Results:  Relevant Lab Results:   Additional Information SS#: [id number]  Baldemar Lenis, LCSW

## 2016-08-20 ENCOUNTER — Inpatient Hospital Stay (HOSPITAL_COMMUNITY): Payer: Medicare Other

## 2016-08-20 DIAGNOSIS — G9340 Encephalopathy, unspecified: Secondary | ICD-10-CM

## 2016-08-20 LAB — BASIC METABOLIC PANEL
ANION GAP: 16 — AB (ref 5–15)
BUN: 7 mg/dL (ref 6–20)
CALCIUM: 8.5 mg/dL — AB (ref 8.9–10.3)
CHLORIDE: 99 mmol/L — AB (ref 101–111)
CO2: 20 mmol/L — AB (ref 22–32)
Creatinine, Ser: 0.71 mg/dL (ref 0.44–1.00)
GFR calc Af Amer: >60 mL/min (ref >60)
GFR calc non Af Amer: >60 mL/min (ref >60)
GLUCOSE: 93 mg/dL (ref 65–99)
Potassium: 3.7 mmol/L (ref 3.5–5.1)
Sodium: 135 mmol/L (ref 135–145)

## 2016-08-20 LAB — CBC
HEMATOCRIT: 36 % (ref 36.0–46.0)
HEMOGLOBIN: 12.1 g/dL (ref 12.0–15.0)
MCH: 30.3 pg (ref 26.0–34.0)
MCHC: 33.6 g/dL (ref 30.0–36.0)
MCV: 90 fL (ref 78.0–100.0)
Platelets: 167 10*3/uL (ref 150–400)
RBC: 4 MIL/uL (ref 3.87–5.11)
RDW: 13.5 % (ref 11.5–15.5)
WBC: 8.1 10*3/uL (ref 4.0–10.5)

## 2016-08-20 LAB — AMMONIA: AMMONIA: 27 umol/L (ref 9–35)

## 2016-08-20 MED ORDER — THIAMINE HCL 100 MG/ML IJ SOLN
100.0000 mg | Freq: Every day | INTRAMUSCULAR | Status: DC
  Administered 2016-08-20 – 2016-08-21 (×2): 100 mg via INTRAVENOUS
  Filled 2016-08-20 (×2): qty 2

## 2016-08-20 MED ORDER — CYANOCOBALAMIN 1000 MCG/ML IJ SOLN: 1000.0000 ug | Freq: Once | INTRAMUSCULAR | Status: DC

## 2016-08-20 MED ORDER — SODIUM CHLORIDE 0.45 % IV SOLN
INTRAVENOUS | Status: AC
  Administered 2016-08-20 – 2016-08-21 (×3): via INTRAVENOUS
  Filled 2016-08-20 (×3): qty 1000

## 2016-08-20 MED ORDER — CYANOCOBALAMIN 1000 MCG/ML IJ SOLN
1000.0000 ug | Freq: Every day | INTRAMUSCULAR | Status: DC
  Administered 2016-08-20 – 2016-08-25 (×6): 1000 ug via INTRAMUSCULAR
  Filled 2016-08-20 (×7): qty 1

## 2016-08-20 MED ORDER — LORAZEPAM 2 MG/ML IJ SOLN
1.0000 mg | Freq: Once | INTRAMUSCULAR | Status: AC | PRN
  Administered 2016-08-21: 1 mg via INTRAVENOUS
  Filled 2016-08-20: qty 1

## 2016-08-20 MED ORDER — LORAZEPAM 2 MG/ML IJ SOLN
1.0000 mg | Freq: Once | INTRAMUSCULAR | Status: DC
  Filled 2016-08-20: qty 1

## 2016-08-20 NOTE — Progress Notes (Signed)
3 Days Post-Op   Subjective/Chief Complaint: No acute events. Remains encephalopathic. Sounds like she will be getting MRI/neuro consult today.   Objective: Vital signs in last 24 hours: Temp:  [97.7 F (36.5 C)-98 F (36.7 C)] 97.7 F (36.5 C) (05/05 0631) Pulse Rate:  [83-116] 83 (05/05 0631) Resp:  [18] 18 (05/05 0631) BP: (93-124)/(73-85) 93/73 (05/05 0631) SpO2:  [98 %-100 %] 100 % (05/05 0631) Last BM Date:  (PTA)  Intake/Output from previous day: 05/04 0701 - 05/05 0700 In: 342.8 [I.V.:342.8] Out: -  Intake/Output this shift: No intake/output data recorded.  General appearance: alert and confused, not really speaking Resp: unlabored GI: soft, nontender, nondistended Neurologic: Mental status: awake but confused/nonverbal, follows some commands  Lab Results:   Recent Labs  08/18/16 0310 08/20/16 0529  WBC 7.2 8.1  HGB 11.6* 12.1  HCT 34.7* 36.0  PLT 206 167   BMET  Recent Labs  08/19/16 0800 08/20/16 0520  NA 134* 135  K 5.0 3.7  CL 106 99*  CO2 20* 20*  GLUCOSE 91 93  BUN 7 7  CREATININE 0.65 0.71  CALCIUM 7.7* 8.5*   PT/INR No results for input(s): LABPROT, INR in the last 72 hours. ABG No results for input(s): PHART, HCO3 in the last 72 hours.  Invalid input(s): PCO2, PO2  Studies/Results: Ct Head Wo Contrast  Result Date: 08/18/2016 CLINICAL DATA:  Patient is nonverbal.  Confusion and lethargy. EXAM: CT HEAD WITHOUT CONTRAST TECHNIQUE: Contiguous axial images were obtained from the base of the skull through the vertex without intravenous contrast. COMPARISON:  November 16, 2011 FINDINGS: Brain: No subdural, epidural, or subarachnoid hemorrhage. No mass, mass effect or midline shift. Ventricles and sulci are stable. Cerebellum, brainstem, and basal cisterns are normal. Mild white matter changes. No acute cortical ischemia or infarct. No other acute abnormalities. Vascular: No hyperdense vessel or unexpected calcification. Skull: Normal. Negative  for fracture or focal lesion. Sinuses/Orbits: No acute finding. Other: None. IMPRESSION: No acute intracranial abnormality. Electronically Signed   By: Gerome Sam III M.D   On: 08/18/2016 16:34    Anti-infectives: Anti-infectives    Start     Dose/Rate Route Frequency Ordered Stop   08/17/16 0641  ceFAZolin (ANCEF) IVPB 2g/100 mL premix  Status:  Discontinued     2 g 200 mL/hr over 30 Minutes Intravenous On call to O.R. 08/17/16 1740 08/17/16 8144      Assessment/Plan: Ongoing workup for altered mental status. Will continue to follow for possible surgery next week  MRI/neuro consult pending Thiamine ordered as well  LOS: 3 days    Colleen Davis 08/20/2016

## 2016-08-20 NOTE — Progress Notes (Signed)
Patient was unable to do MRI per tech. Order received for ativan but when I got there she was already in the hall. Tech says she will try again later.

## 2016-08-20 NOTE — Progress Notes (Signed)
Patient refused labs.  Notified the MD

## 2016-08-20 NOTE — Consult Note (Signed)
Neurology Consult Note  Reason for Consultation: Encephalopathy  Requesting provider: Domenic Polite, MD  CC: Patient does not participate with examination and is largely nonverbal so unable to obtain  HPI: This is a 71 year old woman who was admitted on 08/17/16 for elective cholecystectomy due to symptomatic cholelithiasis. History is obtained from the review of the patient's medical record as she is encephalopathic and unable to provide.  According to notes, the patient has had persistent GI problems since November 2017 including a 40 pound weight loss with ulcerations of the mouth, persistent nausea/vomiting, and postprandial crampy abdominal pain. She reported on the day of admission that over the preceding 30 days she had become weak and was unable to ambulate, requiring use of a wheelchair for mobility. She also reported blurry vision when standing up. She describes poor sleep over the preceding 2 weeks with visual hallucinations and paranoia over the week prior to admission. Occupational therapy note indicates that the patient's sister reported the patient was independent with activities of daily living and mobility until about one week prior to admission. According to the admission H&P, she was noted to be alert and oriented 3 with normal judgment and insight. She was found to be markedly hypokalemic and her surgery was therefore canceled.  On admission to the floor on 08/17/16, the receiving nurse noted that her mental status was alert and oriented 2--this was just a couple of hours after her initial evaluation by the hospitalist service where she was supposedly fully oriented. The next morning, she was noted to be lethargic. It was felt that this was related to the fact that she was given IV lorazepam on admission to cover against possible benzodiazepine withdrawal since she was taking Xanax as needed prior to admission. CT scan of the head was obtained and reportedly showed no acute  abnormality. Yesterday she was noted to be a little bit more awake and alert but still confused. Family reported that they discovered black mold in her trailer and stated that they expressed breathing difficulties and developed sores in her mouth after a brief exposure to this mold, raising concern that this could be contributing to her presentation. Since she remains confused this morning, neurology consultation is now requested for further recommendations.  The patient refuses to participate with the examination. She is nonverbal for me. The other thing she said was "why are you doing that to me" while I was listening to her heart.  PMH: Limited to chart review as the patient is unable to provide due to her encephalopathy. Past Medical History:  Diagnosis Date  . Anxiety   . Chronic kidney disease    polycystic  . Dehydration    recently hosp  . Depression   . History of kidney stones    cyst on kidney  . Hyperlipidemia   . Hypertension   . Hypokalemia     PSH: Limited to chart review as the patient is unable to provide due to her encephalopathy. Past Surgical History:  Procedure Laterality Date  . DILATION AND CURETTAGE OF UTERUS    . TUBAL LIGATION      Family history: Limited to chart review as the patient is unable to provide due to her encephalopathy. History reviewed. No pertinent family history.  Social history: Limited to chart review as the patient is unable to provide due to her encephalopathy. Social History   Social History  . Marital status: Divorced    Spouse name: N/A  . Number of children: N/A  .  Years of education: N/A   Occupational History  . Not on file.   Social History Main Topics  . Smoking status: Never Smoker  . Smokeless tobacco: Never Used  . Alcohol use No  . Drug use: No  . Sexual activity: Not on file   Other Topics Concern  . Not on file   Social History Narrative  . No narrative on file    Current outpatient meds: Medications  reviewed and reconciled.  Current Meds  Medication Sig  . ALPRAZolam (XANAX) 0.25 MG tablet Take 0.25 mg by mouth 3 (three) times daily as needed for anxiety.   Marland Kitchen atorvastatin (LIPITOR) 20 MG tablet Take 20 mg by mouth daily at 6 PM.  . lisinopril-hydrochlorothiazide (PRINZIDE,ZESTORETIC) 20-25 MG per tablet Take 1 tablet by mouth daily.  . ondansetron (ZOFRAN) 4 MG tablet Take 1 tablet (4 mg total) by mouth every 6 (six) hours.  . potassium chloride (K-DUR) 10 MEQ tablet Take 1 tablet (10 mEq total) by mouth daily.    Current inpatient meds: Medications reviewed and reconciled.  Current Facility-Administered Medications  Medication Dose Route Frequency Provider Last Rate Last Dose  . alum & mag hydroxide-simeth (MAALOX/MYLANTA) 200-200-20 MG/5ML suspension 15 mL  15 mL Oral Q6H PRN Donnie Mesa, MD      . carvedilol (COREG) tablet 3.125 mg  3.125 mg Oral BID WC Domenic Polite, MD      . enalaprilat (VASOTEC) injection 0.625 mg  0.625 mg Intravenous Q6H Samella Parr, NP   0.625 mg at 08/20/16 0533  . enoxaparin (LOVENOX) injection 30 mg  30 mg Subcutaneous Daily Donnie Mesa, MD   30 mg at 08/20/16 0950  . feeding supplement (BOOST / RESOURCE BREEZE) liquid 1 Container  1 Container Oral TID BM Samella Parr, NP   1 Container at 08/19/16 [phone]  . folic acid injection 1 mg  1 mg Intravenous Daily Samella Parr, NP   1 mg at 08/20/16 0950  . magic mouthwash  10 mL Oral QID Samella Parr, NP   10 mL at 08/20/16 0950  . ondansetron (ZOFRAN) injection 4 mg  4 mg Intravenous Q6H PRN Erin Hearing L, NP      . ondansetron (ZOFRAN-ODT) disintegrating tablet 4 mg  4 mg Oral Q6H PRN Donnie Mesa, MD      . pantoprazole (PROTONIX) injection 40 mg  40 mg Intravenous QHS Donnie Mesa, MD   40 mg at 08/19/16 2210  . sodium chloride 0.45 % 1,000 mL with potassium chloride 60 mEq infusion   Intravenous Continuous Domenic Polite, MD      . thiamine (B-1) injection 100 mg  100 mg  Intravenous Daily Romana Juniper A, MD   100 mg at 08/20/16 2035    Allergies: Allergies  Allergen Reactions  . No Known Allergies     ROS: As per HPI. A full 14-point review of systems could not be obtained as the patient does not participate.   PE:  BP (!) 118/99   Pulse 83   Temp 97.7 F (36.5 C) (Oral)   Resp 18   Ht 5' 2.5" (1.588 m)   Wt 40.8 kg (90 lb)   SpO2 100%   BMI 16.20 kg/m   General: Thin Caucasian woman lying in bed. She is restless. She is curled up on her left side and will not turnover for the examination. Participation with examination is poor and at times he actively resists the exam. She is essentially nonverbal but did  mumble a couple of times. She does not follow any commands for me. She is alert. Examination is somewhat limited by her poor effort and refusal to participate. HEENT: Normocephalic. Neck supple without LAD. I was unable to visualize her oropharynx. Sclerae anicteric. No conjunctival injection.  CV: Regular, no murmur. Carotid pulses full and symmetric, no bruits. Distal pulses 2+ and symmetric.  Lungs: CTAB.  Abdomen: Soft, non-distended, non-tender. Bowel sounds present x4.  Extremities: No C/C/E. Neuro:  CN: She squeezes her eyes shut when I try to examine them so I am unable to visualize her pupils and could not assess visual fields. Her eyes are conjugate at rest. No obvious nystagmus is seen. She does not appear to have any forced deviation. Corneals are intact and symmetric. Her face appears to be grossly symmetric at rest and she has a symmetric grimace. The remainder of her cranial nerve examination is limited by poor cooperation.  Motor: Normal bulk. She resists passive movement of the extremities so tone is difficult to assess. She moves all 4 extremities with at least 4/5 strength with strength appearing to be normal. Movements are purposeful. No tremor or other abnormal movements.  Sensation: She grimaces and withdraws from minimal  noxious stimuli 4.  DTRs: 2+, symmetric. Toes downgoing bilaterally. No pathologic reflexes.  Coordination/gait: These cannot be assessed as the patient does not participate the examination. By observation, no evidence of dysmetria is seen with spontaneous movements of her arms.   Labs:  Lab Results  Component Value Date   WBC 8.1 08/20/2016   HGB 12.1 08/20/2016   HCT 36.0 08/20/2016   PLT 167 08/20/2016   GLUCOSE 93 08/20/2016   CHOL 118 11/17/2011   TRIG 75 11/17/2011   HDL 33 (L) 11/17/2011   LDLCALC 70 11/17/2011   ALT 12 (L) 08/18/2016   AST 21 08/18/2016   NA 135 08/20/2016   K 3.7 08/20/2016   CL 99 (L) 08/20/2016   CREATININE 0.71 08/20/2016   BUN 7 08/20/2016   CO2 20 (L) 08/20/2016   TSH 0.336 (L) 08/17/2016   Vitamin B12 267 HIV nonreactive Phosphorus 5.4, up from 2.2 on 08/17/16 T3 60 Free T4 1 0.12 TSH 0.336 Folate 3.0 Prealbumin 10.6 ESR 20 RPR nonreactive Ferritin 148 Serum iron 44, TIBC 193  Imaging:  I have personally and independently reviewed CT scan of the head without contrast from 08/18/16. This shows moderate diffuse generalized atrophy. There is ex vacuo dilatation of the ventricles which are otherwise normal in configuration. Mild to moderate chronic small vessel ischemic changes are noted in the bihemispheric white matter. No obvious acute abnormalities are seen.  Other diagnostic studies:  TTE from 5/recess 18: Left ventricular ejection fraction 30-35 percent; severe hypokinesis of the distal septal, distal inferior, mid/distal anterior, mid/distal anterolateral walls; akinesis of the apex; trivial aortic regurgitation; mild mitral regurgitation  Assessment and Plan:  1. Acute encephalopathy: Based upon available information, it sounds as if she has had some decline for several weeks before presentation, describing poor sleep and visual hallucinations at home. It seems like she's had a more precipitous decline in her mental status following her  admission to the hospital on 08/17/16. Given her underlying malnourished state, must consider thiamine deficiency and Wernicke's encephalopathy. Notes report that she does not drink any alcohol but has taken Xanax for anxiety, so agree that benzodiazepine withdrawal must be considered. She does not have any meningismus or fever that would suggest CNS infection. Given her poor ejection fraction and  wall motion abnormalities on echocardiogram, embolic stroke is also a consideration. Laboratory workup to date is notable for a vitamin B12 level in the low normal range. Serum ammonia is pending. I will add a thiamine level. Agree with MRI scan of the brain to evaluate for structural pathology. Continue thiamine supplementation. Minimize CNS active medications as much as possible, opiates, and anything with strong anticholinergic properties. Benzodiazepines may be considered to cover against possible withdrawal symptoms but should otherwise be kept to a minimum. Every effort should be made to optimize circadian rhythms, keeping her room bright and active by day and quiet and dark by night.  This was discussed with family at the bedside. Education was provided on the diagnosis and expected evaluation and treatment. He is in agreement with the plan as noted. He was given the opportunity to ask any questions and these were addressed to his satisfaction.   Thank you for this consultation. Neurology will continue to follow. Please call with any urgent questions or concerns.

## 2016-08-20 NOTE — Progress Notes (Signed)
  Speech Language Pathology Treatment: Dysphagia  Patient Details Name: Colleen Davis MRN: [id number] DOB: Feb 21, 1946 Today's Date: 08/20/2016 Time: 4356-8616 SLP Time Calculation (min) (ACUTE ONLY): 10 min  Assessment / Plan / Recommendation Clinical Impression  Pt seen for dysphagia treatment. Son present. Pt alert and states name, oriented to self only. Follows 50% of verbal commands. Son reports she has been refusing to drink liquids (currently on clear liquids per MD). Administered teaspoon trials of ice; pt initially expectorates ice chip, but with verbal cues masticates and swallows with no overt signs of aspiration. Pt retrieves thin liquids via teaspoon with small sip, appearance of timely swallow and no overt signs of aspiration. With cup, straw sips, pt requires max cues and assist for bolus retrieval, noted with anterior oral spillage. Continues to initiate what appears to be a timely swallow. Pt's bolus acceptance and initiation are better with teaspoon sips. Provided education to son re: full assistance, allowing pt to retrieve bolus by placing spoon to her lips. Recommend full supervision and assistance for feeding of teaspoon sips only. SLP will f/u for tolerance, advancement anticipated with improvements in mentation.    HPI HPI: 71 year old female who presents with a one-month history of persistent nausea and vomiting as well as weight loss. Found to have gallstonnes. PMHx: Anxiety, CKD, Depression, Hyperipidemia, HTN      SLP Plan  Continue with current plan of care       Recommendations  Diet recommendations: Thin liquid Liquids provided via: Teaspoon Medication Administration: Crushed with puree Supervision: Trained caregiver to feed patient;Full supervision/cueing for compensatory strategies Compensations: Slow rate;Small sips/bites;Minimize environmental distractions;Monitor for anterior loss Postural Changes and/or Swallow Maneuvers: Seated upright 90 degrees               Oral Care Recommendations: Oral care QID Follow up Recommendations: Other (comment) (TBD) SLP Visit Diagnosis: Dysphagia, unspecified (R13.10) Plan: Continue with current plan of care       GO              Rondel Baton, MS, CCC-SLP Speech-Language Pathologist [phone]   Colleen Davis 08/20/2016, 2:17 PM

## 2016-08-20 NOTE — Progress Notes (Signed)
PROGRESS NOTE    SEVEN MARENGO  WSF:[id number] DOB: 1946-01-29 DOA: 08/17/2016 PCP: Alroy Dust, L.Marlou Sa, MD  Brief Narrative:Colleen Davis is a 71 y.o. female with medical history significant for hypertension on ACE inhibitor/thiazide diuretic combination, dyslipidemia and hypertension. Patient was scheduled to have elective cholecystectomy in the setting of symptomatic cholelithiasis today. Unfortunately her preoperative potassium was low at 2.4 therefore surgery was canceled. patient has been having ongoing GI issues since November 2017. She has had a 40 pound weight loss since onset of symptoms. Symptoms are primarily related to painful inflammation of the mouth and initially started as ulcers, persistent nausea, and crampy abdominal pain after eating. Patient has not had any blood in her stools or emesis. The emesis is reported as green and bilious. Over the past 30 days patient has become weak and if she is unable to ambulate and has required a wheelchair for mobility. She complains of blurred vision especially with standing. She has not been sleeping well especially over the past 2 weeks.    Assessment & Plan:   Principal Problem:   Hypokalemia -severe due to diuretics/poor Po intake  -Replaced IV, patient refused to take this PO -Phosphorus replaced, Mag ok -K 3.7 today, resume IV KCL   Lethargy/encephalopathy/confusion -since admission 5/2 -sedating meds cut down, was on QHS PRN lorazepam instead of xanax HS at home, I stopped this 5/4  -CT head unremarkable -TSH unremarkable, HIV negative/RPR negative/ B12 and folate unremarkable/ESR normal -check MRI brain and NEuro consulted -also check Ammonia level -if mentation not improved and unable to start PO, then will place NGT tomorrow and start TFs    Nausea and Vomiting -Suspect to be related to symptomatic cholelithiasis -Has had a GI workup recently with EGD  Unremarkable except for gastritis, unable to have colonoscopy due to  prep -HIV negative, ESR 20 -CT Abd pelvis from 3/30 with Mild submucosal fatty deposition within cecum ascending colon without acute bowel inflammation and Cholelithiasis with gallbladder distention, never had a colonoscopy    HTN (hypertension) -HCTZ stopped  Cardiomyopathy EF 35% -ECHO with wall motion abnormality -TSH mildly low, T4 ok -needs a cardiac workup when mentation and overall condition better   Hyperglycemia -check hba1c, takes thiazide which can cause some hyperglycemia    Symptomatic cholelithiasis -as above    Severe protein-calorie malnutrition (HCC) -due to persistent nausea and vomiting/above, prealbumin is 10 -will consider Tube feeds tomorrow If mentation unchanged  DVT prophylaxis:lovenox Code Status: Full Code Family Communication: sister at bedside Disposition Plan: Home when improved    Subjective: More alert, but confused  Objective: Vitals:   08/19/16 1350 08/19/16 2209 08/20/16 0631 08/20/16 0947  BP: 122/83 124/85 93/73 (!) 118/99  Pulse: (!) 116 89 83   Resp: _0 Temp: 98 F (36.7 C) 97.8 F (36.6 C) 97.7 F (36.5 C)   TempSrc: Oral Oral Oral   SpO2: 98% 100% 100%   Weight:      Height:        Intake/Output Summary (Last 24 hours) at 08/20/16 1200 Last data filed at 08/20/16 1036  Gross per 24 hour  Intake                1 ml  Output                0 ml  Net                1 ml   [employer]  08/17/16 0627 08/17/16 0652  Weight: 40.8 kg (90 lb) 40.8 kg (90 lb)    Examination:  General exam: Frail thin built Caucasian female laying in bed, more alert, but somewhat confused, doesn't answer my questions but responds more appropriately to her sister Respiratory system: decreased BS at bases Cardiovascular system: S1 & S2 heard, RRR. No JVD, murmurs Gastrointestinal system: Abdomen is nondistended, soft and nontender.  Normal bowel sounds heard. Central nervous system: Somnolent No focal neurological  deficits. Extremities: Symmetric 5 x 5 power. Skin: No rashes, lesions or ulcers Psychiatry: Unable to assess    Data Reviewed:   CBC:  Recent Labs Lab 08/15/16 1421 08/17/16 0656 08/17/16 0727 08/18/16 0310 08/20/16 0529  WBC 8.5  --  6.8 7.2 8.1  NEUTROABS  --   --   --  5.8  --   HGB 13.6 12.6 12.7 11.6* 12.1  HCT 39.6 37.0 37.1 34.7* 36.0  MCV 90.6  --  90.9 90.1 90.0  PLT 285  --  209 206 967   Basic Metabolic Panel:  Recent Labs Lab 08/17/16 0727 08/17/16 0927 08/17/16 1906 08/18/16 0310 08/19/16 0800 08/20/16 0520  NA 143  --  139 136 134* 135  K 2.4*  --  2.6* 2.4* 5.0 3.7  CL 103  --  100* 96* 106 99*  CO2 25  --  26 25 20* 20*  GLUCOSE 113*  --  137* 134* 91 93  BUN 17  --  _0 CREATININE 0.90  --  0.73 0.70 0.65 0.71  CALCIUM 8.8*  --  8.0* 7.5* 7.7* 8.5*  MG  --  1.9  --   --   --   --   PHOS  --  2.2*  --  5.4*  --   --    GFR: Estimated Creatinine Clearance: 42.7 mL/min (by C-G formula based on SCr of 0.71 mg/dL). Liver Function Tests:  Recent Labs Lab 08/17/16 0727 08/18/16 0310  AST 20 21  ALT 13* 12*  ALKPHOS 70 66  BILITOT 2.0* 1.4*  PROT 6.1* 5.5*  ALBUMIN 3.4* 3.3*   No results for input(s): LIPASE, AMYLASE in the last 168 hours. No results for input(s): AMMONIA in the last 168 hours. Coagulation Profile: No results for input(s): INR, PROTIME in the last 168 hours. Cardiac Enzymes: No results for input(s): CKTOTAL, CKMB, CKMBINDEX, TROPONINI in the last 168 hours. BNP (last 3 results) No results for input(s): PROBNP in the last 8760 hours. HbA1C: No results for input(s): HGBA1C in the last 72 hours. CBG: No results for input(s): GLUCAP in the last 168 hours. Lipid Profile: No results for input(s): CHOL, HDL, LDLCALC, TRIG, CHOLHDL, LDLDIRECT in the last 72 hours. Thyroid Function Tests:  Recent Labs  08/17/16 1224  FREET4 1.12   Anemia Panel:  Recent Labs  08/19/16 1648  VITAMINB12 267   Urine  analysis:    Component Value Date/Time   COLORURINE AMBER (A) 08/13/2016 1231   APPEARANCEUR HAZY (A) 08/13/2016 1231   LABSPEC 1.020 08/13/2016 1231   PHURINE 6.0 08/13/2016 1231   GLUCOSEU NEGATIVE 08/13/2016 1231   HGBUR NEGATIVE 08/13/2016 1231   BILIRUBINUR SMALL (A) 08/13/2016 1231   KETONESUR 80 (A) 08/13/2016 1231   PROTEINUR 30 (A) 08/13/2016 1231   UROBILINOGEN 1.0 11/16/2011 2141   NITRITE NEGATIVE 08/13/2016 1231   LEUKOCYTESUR LARGE (A) 08/13/2016 1231   Sepsis Labs: _1 (procalcitonin:4,lacticidven:4)  ) Recent Results (from the past 240 hour(s))  Culture, Urine  Status: Abnormal   Collection Time: 08/17/16  8:30 AM  Result Value Ref Range Status   Specimen Description URINE, RANDOM  Final   Special Requests NONE  Final   Culture MULTIPLE SPECIES PRESENT, SUGGEST RECOLLECTION (A)  Final   Report Status 08/18/2016 FINAL  Final         Radiology Studies: Ct Head Wo Contrast  Result Date: 08/18/2016 CLINICAL DATA:  Patient is nonverbal.  Confusion and lethargy. EXAM: CT HEAD WITHOUT CONTRAST TECHNIQUE: Contiguous axial images were obtained from the base of the skull through the vertex without intravenous contrast. COMPARISON:  November 16, 2011 FINDINGS: Brain: No subdural, epidural, or subarachnoid hemorrhage. No mass, mass effect or midline shift. Ventricles and sulci are stable. Cerebellum, brainstem, and basal cisterns are normal. Mild white matter changes. No acute cortical ischemia or infarct. No other acute abnormalities. Vascular: No hyperdense vessel or unexpected calcification. Skull: Normal. Negative for fracture or focal lesion. Sinuses/Orbits: No acute finding. Other: None. IMPRESSION: No acute intracranial abnormality. Electronically Signed   By: Dorise Bullion III M.D   On: 08/18/2016 16:34        Scheduled Meds: . carvedilol  3.125 mg Oral BID WC  . enalaprilat  0.625 mg Intravenous Q6H  . enoxaparin (LOVENOX) injection  30 mg  Subcutaneous Daily  . feeding supplement  1 Container Oral TID BM  . folic acid  1 mg Intravenous Daily  . magic mouthwash  10 mL Oral QID  . pantoprazole (PROTONIX) IV  40 mg Intravenous QHS  . thiamine injection  100 mg Intravenous Daily   Continuous Infusions: . sodium chloride 0.45 % with kcl       LOS: 3 days    Time spent: 43mn   PDomenic Polite MD [facility] Pager [phone] If 7PM-7AM, please contact night-coverage [email or web address] Password [email or web address] 08/20/2016, 12:00 PM

## 2016-08-21 ENCOUNTER — Inpatient Hospital Stay (HOSPITAL_COMMUNITY): Payer: Medicare Other

## 2016-08-21 LAB — BASIC METABOLIC PANEL
ANION GAP: 13 (ref 5–15)
BUN: 10 mg/dL (ref 6–20)
CALCIUM: 8.6 mg/dL — AB (ref 8.9–10.3)
CO2: 18 mmol/L — AB (ref 22–32)
Chloride: 103 mmol/L (ref 101–111)
Creatinine, Ser: 0.8 mg/dL (ref 0.44–1.00)
GFR calc Af Amer: >60 mL/min (ref >60)
GFR calc non Af Amer: >60 mL/min (ref >60)
GLUCOSE: 75 mg/dL (ref 65–99)
Potassium: 4.5 mmol/L (ref 3.5–5.1)
Sodium: 134 mmol/L — ABNORMAL LOW (ref 135–145)

## 2016-08-21 LAB — CBC
HCT: 31.6 % — ABNORMAL LOW (ref 36.0–46.0)
Hemoglobin: 10.7 g/dL — ABNORMAL LOW (ref 12.0–15.0)
MCH: 30.7 pg (ref 26.0–34.0)
MCHC: 33.9 g/dL (ref 30.0–36.0)
MCV: 90.8 fL (ref 78.0–100.0)
Platelets: 164 K/uL (ref 150–400)
RBC: 3.48 MIL/uL — ABNORMAL LOW (ref 3.87–5.11)
RDW: 13.6 % (ref 11.5–15.5)
WBC: 7.5 K/uL (ref 4.0–10.5)

## 2016-08-21 LAB — GLUCOSE, CAPILLARY: GLUCOSE-CAPILLARY: 96 mg/dL (ref 65–99)

## 2016-08-21 MED ORDER — SODIUM CHLORIDE 0.9 % IV SOLN: INTRAVENOUS | Status: DC

## 2016-08-21 MED ORDER — THIAMINE HCL 100 MG/ML IJ SOLN
500.0000 mg | Freq: Three times a day (TID) | INTRAVENOUS | Status: AC
  Administered 2016-08-21 – 2016-08-23 (×9): 500 mg via INTRAVENOUS
  Filled 2016-08-21 (×9): qty 5

## 2016-08-21 MED ORDER — LORAZEPAM 2 MG/ML IJ SOLN
1.0000 mg | Freq: Once | INTRAMUSCULAR | Status: AC | PRN
  Administered 2016-08-21: 1 mg via INTRAVENOUS
  Filled 2016-08-21: qty 1

## 2016-08-21 MED ORDER — NALOXONE HCL 0.4 MG/ML IJ SOLN
INTRAMUSCULAR | Status: AC
  Filled 2016-08-21: qty 1

## 2016-08-21 MED ORDER — HALOPERIDOL LACTATE 5 MG/ML IJ SOLN
2.0000 mg | Freq: Once | INTRAMUSCULAR | Status: AC
  Administered 2016-08-21: 2 mg via INTRAVENOUS
  Filled 2016-08-21: qty 1

## 2016-08-21 MED ORDER — FLUMAZENIL 0.5 MG/5ML IV SOLN
0.3000 mg | Freq: Once | INTRAVENOUS | Status: AC
  Administered 2016-08-21: 0.3 mg via INTRAVENOUS

## 2016-08-21 MED ORDER — SODIUM CHLORIDE 0.9 % IV BOLUS (SEPSIS)
500.0000 mL | Freq: Once | INTRAVENOUS | Status: AC
  Administered 2016-08-21: 500 mL via INTRAVENOUS

## 2016-08-21 MED ORDER — FLUMAZENIL 0.5 MG/5ML IV SOLN
INTRAVENOUS | Status: AC
  Filled 2016-08-21: qty 5

## 2016-08-21 NOTE — Progress Notes (Addendum)
PROGRESS NOTE    Colleen Davis  GUY:[id number] DOB: 05-Aug-1945 DOA: 08/17/2016 PCP: Alroy Dust, L.Marlou Sa, MD  Brief Narrative:Colleen Davis is a 71 y.o. female with medical history significant for hypertension on ACE inhibitor/thiazide diuretic combination, dyslipidemia and hypertension. Patient was scheduled to have elective cholecystectomy in the setting of symptomatic cholelithiasis today. Unfortunately her preoperative potassium was low at 2.4 therefore surgery was canceled. patient has been having ongoing GI issues since November 2017. She has had a 40 pound weight loss since onset of symptoms. Symptoms are primarily related to painful inflammation of the mouth and initially started as ulcers, persistent nausea, and crampy abdominal pain after eating. Patient has not had any blood in her stools or emesis. The emesis is reported as green and bilious. Over the past 30 days patient has become weak and if she is unable to ambulate and has required a wheelchair for mobility. She complains of blurred vision especially with standing. She has not been sleeping well especially over the past 2 weeks.    Assessment & Plan:     Lethargy/encephalopathy/confusion -since admission 5/2 -considerably better this am -sedating meds cut down, was on QHS PRN lorazepam instead of xanax HS at home, I stopped this 5/4  -CT head unremarkable -TSH mildly low but free T4 normal, HIV negative/RPR negative/ B12 and folate unremarkable/ESR/Ammonia normal -Appreciate NEuro input, unable to do MRI brain yesterday, crawled out of it, re-attempt as tolerated -tolerating some POs-liquids    Nausea and Vomiting -Suspected to be related to symptomatic cholelithiasis -Has had a GI workup recently with EGD  Unremarkable except for gastritis, unable to have colonoscopy due to prep -HIV negative, ESR 20 -CT Abd pelvis from 3/30 with Mild submucosal fatty deposition within cecum ascending colon without acute bowel inflammation  and Cholelithiasis with gallbladder distention, never had a colonoscopy   Hypokalemia -severe due to diuretics/poor Po intake  -Replaced IV, patient refused to take this PO -Phosphorus replaced, Mag ok -K 4.5 this am    HTN (hypertension) -HCTZ stopped  Cardiomyopathy EF 35% -ECHO with wall motion abnormality -TSH mildly low, T4 ok -needs a cardiac workup when mentation and overall condition better, will ask Cards to see her next few days if mentation continues to improve   Hyperglycemia -check hba1c, takes thiazide which can cause some hyperglycemia    Symptomatic cholelithiasis -as above    Severe protein-calorie malnutrition ([place]) -due to persistent nausea and vomiting/above, prealbumin is 10 -increase PO intake as mentation improves  DVT prophylaxis:lovenox Code Status: Full Code Family Communication: son at bedside Disposition Plan: Home when improved    Subjective: More alert, but confused  Objective: Vitals:   08/20/16 2209 08/20/16 2241 08/21/16 0501 08/21/16 0741  BP: (!) 144/103 (!) 146/92 126/77 96/82  Pulse: 95  78   Resp: 18  18   Temp: 98.3 F (36.8 C)  97.7 F (36.5 C)   TempSrc:   Axillary   SpO2: 98%  98%   Weight:      Height:        Intake/Output Summary (Last 24 hours) at 08/21/16 1056 Last data filed at 08/21/16 0647  Gross per 24 hour  Intake          1361.25 ml  Output                0 ml  Net          1361.25 ml   Filed Weights   08/17/16 0627 08/17/16 0652  Weight:  40.8 kg (90 lb) 40.8 kg (90 lb)    Examination:  General exam: Frail thin built Caucasian female laying in bed, much more alert, answers some questions right Respiratory system: decreased BS at bases Cardiovascular system: S1 & S2 heard, RRR. No JVD, murmurs Gastrointestinal system: Abdomen is nondistended, soft and nontender.  Normal bowel sounds heard. Central nervous system: Somnolent No focal neurological deficits. Extremities: Symmetric 5 x 5 power. Skin:  No rashes, lesions or ulcers Psychiatry: Unable to assess    Data Reviewed:   CBC:  Recent Labs Lab 08/15/16 1421 08/17/16 0656 08/17/16 0727 08/18/16 0310 08/20/16 0529 08/21/16 0839  WBC 8.5  --  6.8 7.2 8.1 7.5  NEUTROABS  --   --   --  5.8  --   --   HGB 13.6 12.6 12.7 11.6* 12.1 10.7*  HCT 39.6 37.0 37.1 34.7* 36.0 31.6*  MCV 90.6  --  90.9 90.1 90.0 90.8  PLT 285  --  209 206 167 517   Basic Metabolic Panel:  Recent Labs Lab 08/17/16 0927 08/17/16 1906 08/18/16 0310 08/19/16 0800 08/20/16 0520 08/21/16 0839  NA  --  139 136 134* 135 134*  K  --  2.6* 2.4* 5.0 3.7 4.5  CL  --  100* 96* 106 99* 103  CO2  --  26 25 20* 20* 18*  GLUCOSE  --  137* 134* 91 93 75  BUN  --  _0 CREATININE  --  0.73 0.70 0.65 0.71 0.80  CALCIUM  --  8.0* 7.5* 7.7* 8.5* 8.6*  MG 1.9  --   --   --   --   --   PHOS 2.2*  --  5.4*  --   --   --    GFR: Estimated Creatinine Clearance: 42.7 mL/min (by C-G formula based on SCr of 0.8 mg/dL). Liver Function Tests:  Recent Labs Lab 08/17/16 0727 08/18/16 0310  AST 20 21  ALT 13* 12*  ALKPHOS 70 66  BILITOT 2.0* 1.4*  PROT 6.1* 5.5*  ALBUMIN 3.4* 3.3*   No results for input(s): LIPASE, AMYLASE in the last 168 hours.  Recent Labs Lab 08/20/16 1928  AMMONIA 27   Coagulation Profile: No results for input(s): INR, PROTIME in the last 168 hours. Cardiac Enzymes: No results for input(s): CKTOTAL, CKMB, CKMBINDEX, TROPONINI in the last 168 hours. BNP (last 3 results) No results for input(s): PROBNP in the last 8760 hours. HbA1C: No results for input(s): HGBA1C in the last 72 hours. CBG: No results for input(s): GLUCAP in the last 168 hours. Lipid Profile: No results for input(s): CHOL, HDL, LDLCALC, TRIG, CHOLHDL, LDLDIRECT in the last 72 hours. Thyroid Function Tests: No results for input(s): TSH, T4TOTAL, FREET4, T3FREE, THYROIDAB in the last 72 hours. Anemia Panel:  Recent Labs  08/19/16 1648  VITAMINB12  267   Urine analysis:    Component Value Date/Time   COLORURINE AMBER (A) 08/13/2016 1231   APPEARANCEUR HAZY (A) 08/13/2016 1231   LABSPEC 1.020 08/13/2016 1231   PHURINE 6.0 08/13/2016 1231   GLUCOSEU NEGATIVE 08/13/2016 1231   HGBUR NEGATIVE 08/13/2016 1231   BILIRUBINUR SMALL (A) 08/13/2016 1231   KETONESUR 80 (A) 08/13/2016 1231   PROTEINUR 30 (A) 08/13/2016 1231   UROBILINOGEN 1.0 11/16/2011 2141   NITRITE NEGATIVE 08/13/2016 1231   LEUKOCYTESUR LARGE (A) 08/13/2016 1231   Sepsis Labs: _1 (procalcitonin:4,lacticidven:4)  ) Recent Results (from the past 240 hour(s))  Culture, Urine  Status: Abnormal   Collection Time: 08/17/16  8:30 AM  Result Value Ref Range Status   Specimen Description URINE, RANDOM  Final   Special Requests NONE  Final   Culture MULTIPLE SPECIES PRESENT, SUGGEST RECOLLECTION (A)  Final   Report Status 08/18/2016 FINAL  Final         Radiology Studies: No results found.      Scheduled Meds: . carvedilol  3.125 mg Oral BID WC  . cyanocobalamin  1,000 mcg Intramuscular Daily  . enoxaparin (LOVENOX) injection  30 mg Subcutaneous Daily  . feeding supplement  1 Container Oral TID BM  . folic acid  1 mg Intravenous Daily  . magic mouthwash  10 mL Oral QID  . pantoprazole (PROTONIX) IV  40 mg Intravenous QHS  . thiamine injection  100 mg Intravenous Daily   Continuous Infusions: . sodium chloride       LOS: 4 days    Time spent: 32mn   PDomenic Polite MD [facility] Pager [phone] If 7PM-7AM, please contact night-coverage [email or web address] Password [email or web address] 08/21/2016, 10:56 AM

## 2016-08-21 NOTE — Progress Notes (Signed)
4 Days Post-Op   Subjective/Chief Complaint: No acute events. Son says she seems a little better this morning, a little more interactive. Was not able to do MRI yest.   Objective: Vital signs in last 24 hours: Temp:  [97.7 F (36.5 C)-98.3 F (36.8 C)] 97.7 F (36.5 C) (05/06 0501) Pulse Rate:  [78-95] 78 (05/06 0501) Resp:  [18] 18 (05/06 0501) BP: (109-146)/(74-108) 126/77 (05/06 0501) SpO2:  [98 %-100 %] 98 % (05/06 0501) Last BM Date:  (PTA)  Intake/Output from previous day: 05/05 0701 - 05/06 0700 In: 1361.3 [I.V.:1361.3] Out: -  Intake/Output this shift: No intake/output data recorded.  Gen_ alert, nods and shakes head Unlabored respirations Abdomen soft, nontender, nondistended Extremities warm  Lab Results:   Recent Labs  08/20/16 0529  WBC 8.1  HGB 12.1  HCT 36.0  PLT 167   BMET  Recent Labs  08/19/16 0800 08/20/16 0520  NA 134* 135  K 5.0 3.7  CL 106 99*  CO2 20* 20*  GLUCOSE 91 93  BUN 7 7  CREATININE 0.65 0.71  CALCIUM 7.7* 8.5*   PT/INR No results for input(s): LABPROT, INR in the last 72 hours. ABG No results for input(s): PHART, HCO3 in the last 72 hours.  Invalid input(s): PCO2, PO2  Studies/Results: No results found.  Anti-infectives: Anti-infectives    Start     Dose/Rate Route Frequency Ordered Stop   08/17/16 0641  ceFAZolin (ANCEF) IVPB 2g/100 mL premix  Status:  Discontinued     2 g 200 mL/hr over 30 Minutes Intravenous On call to O.R. 08/17/16 2694 08/17/16 8546      Assessment/Plan: Ongoing workup for altered mental status. Will continue to follow for possible surgery next week  MRI pending, appreciate neurology consultation Thiamine ordered yesterday.  Consider adding MVI  Continue to avoid sedating medications Sleep hygiene  Will continue to follow peripherally  LOS: 4 days    Berna Bue 08/21/2016

## 2016-08-21 NOTE — Progress Notes (Signed)
Neurology Progress Note  Subjective: She is more alert and interactive this morning but still quite confused. MRI was attempted yesterday but she did not tolerate it. Per RN, she crawled out of the machine and stripped herself naked so the scan was deferred and is being planned for today with some Ativan beforehand. The patient is encephalopathic and effort with the exam is limited. She does not participate with ROS.   Medications reviewed and reconciled.   Pertinent meds: Vitamin B12 1000 mcg IM daily Folate 1 mg daily Thiamine 100 mg daily  Current Meds:   Current Facility-Administered Medications:  .  alum & mag hydroxide-simeth (MAALOX/MYLANTA) 200-200-20 MG/5ML suspension 15 mL, 15 mL, Oral, Q6H PRN, Manus Rudd, MD .  carvedilol (COREG) tablet 3.125 mg, 3.125 mg, Oral, BID WC, Zannie Cove, MD .  cyanocobalamin ((VITAMIN B-12)) injection 1,000 mcg, 1,000 mcg, Intramuscular, Daily, Versie Starks, MD, 1,000 mcg at 08/21/16 0746 .  enoxaparin (LOVENOX) injection 30 mg, 30 mg, Subcutaneous, Daily, Manus Rudd, MD, 30 mg at 08/21/16 0746 .  feeding supplement (BOOST / RESOURCE BREEZE) liquid 1 Container, 1 Container, Oral, TID BM, Russella Dar, NP, 1 Container at 08/21/16 1000 .  folic acid injection 1 mg, 1 mg, Intravenous, Daily, Junious Silk L, NP, 1 mg at 08/21/16 0745 .  magic mouthwash, 10 mL, Oral, QID, Junious Silk L, NP, 10 mL at 08/21/16 0745 .  ondansetron (ZOFRAN) injection 4 mg, 4 mg, Intravenous, Q6H PRN **OR** [DISCONTINUED] promethazine (PHENERGAN) injection 12.5 mg, 12.5 mg, Intravenous, Q6H PRN, Junious Silk L, NP .  ondansetron (ZOFRAN-ODT) disintegrating tablet 4 mg, 4 mg, Oral, Q6H PRN **OR** [DISCONTINUED] ondansetron (ZOFRAN) injection 4 mg, 4 mg, Intravenous, Q6H PRN, Manus Rudd, MD .  pantoprazole (PROTONIX) injection 40 mg, 40 mg, Intravenous, QHS, Manus Rudd, MD, 40 mg at 08/20/16 2117 .  thiamine (B-1) injection 100 mg, 100 mg,  Intravenous, Daily, Phylliss Blakes A, MD, 100 mg at 08/21/16 0746  Objective:  Temp:  [97.7 F (36.5 C)-98.3 F (36.8 C)] 97.7 F (36.5 C) (05/06 0501) Pulse Rate:  [78-95] 78 (05/06 0501) Resp:  [18] 18 (05/06 0501) BP: (96-146)/(74-108) 96/82 (05/06 0741) SpO2:  [98 %-100 %] 98 % (05/06 0501)  General: Thin Caucasian woman lying in bed. She is slightly lethargic but better than yesterday. She will answer some simple questions, knows her name and that she is at [facility] in [place], [place]. She does not know why she is here and is totally disoriented to time. She follows some commands but sustained attention is limited. She will answer questions with some mild delay in speed of processing. She is clearly confused and is mildly confabulatory.  HEENT: Neck is supple without lymphadenopathy. Mucous membranes are slightly dry and the oropharynx is clear. Sclerae are anicteric. There is no conjunctival injection.  CV: Regular, no murmur. Carotid pulses are 2+ and symmetric with no bruits. Distal pulses 2+ and symmetric.  Lungs: CTAB  Extremities: No C/C/E. Neuro: MS: As noted above.  CN: Pupils are equal and reactive from 3-->2 mm bilaterally. She blinks to threat from both sides. There is mild ocular misalignment with bilateral esophoria at rest. With EOMI, there is some ocular dysmotility without frank ophthalmoplegia. no nystagmus. Corneals are intact. Her face appears grossly symmetric.  Hearing is intact to conversational voice. Voice is hypophonic. Tongue protrudes to midline.  Motor: Muscle bulk is reduced diffusely. She has mitgehen paratonia with apparently normal underlying tone. On limited confrontational testing, her  strength is normal. No tremor or other abnormal movements are observed.  Sensation: She withdraws from mild noxious stimuli x4.  DTRs: Brisk 2+, symmetric. Toes are downgoing bilaterally.  Coordination: With finger-to-nose, she reaches out to a point in front of  her but does not touch my finger. No overt dysmetria.  Gait: Deferred due to encephalopathy and poor effort.   Labs: Lab Results  Component Value Date   WBC 7.5 08/21/2016   HGB 10.7 (L) 08/21/2016   HCT 31.6 (L) 08/21/2016   PLT 164 08/21/2016   GLUCOSE 75 08/21/2016   CHOL 118 11/17/2011   TRIG 75 11/17/2011   HDL 33 (L) 11/17/2011   LDLCALC 70 11/17/2011   ALT 12 (L) 08/18/2016   AST 21 08/18/2016   NA 134 (L) 08/21/2016   K 4.5 08/21/2016   CL 103 08/21/2016   CREATININE 0.80 08/21/2016   BUN 10 08/21/2016   CO2 18 (L) 08/21/2016   TSH 0.336 (L) 08/17/2016   CBC Latest Ref Rng & Units 08/21/2016 08/20/2016 08/18/2016  WBC 4.0 - 10.5 K/uL 7.5 8.1 7.2  Hemoglobin 12.0 - 15.0 g/dL 10.7(L) 12.1 11.6(L)  Hematocrit 36.0 - 46.0 % 31.6(L) 36.0 34.7(L)  Platelets 150 - 400 K/uL 164 167 206    No results found for: HGBA1C Lab Results  Component Value Date   ALT 12 (L) 08/18/2016   AST 21 08/18/2016   ALKPHOS 66 08/18/2016   BILITOT 1.4 (H) 08/18/2016   NH3 27 Thiamine pending  Radiology:  MRI brain pending   A/P:   1. Acute encephalopathy: Etiology remains unclear. Wernicke's is a concern given malnutrition and apparent mild ocular dysmotility on today's exam. Cannot exclude the possibility of stroke, particularly embolic subcortical stroke. Given GI symptoms, encephalopathy, sleep disturbance, and ocular dysmotility, consider CNS Whipple disease. Benzo withdrawal is possible. Await MRI brain. Serum thiamine level is pending but she has been getting 100 mg daily since admission. I will start high dose thiamine empirically.   This was discussed with the patient's son who is present at the bedside. Education was provided on the diagnosis and expected evaluation and treatment. He is in agreement with the plan as noted. He was given the opportunity to ask any questions and these were addressed to his satisfaction.   Rhona Leavens, MD [facility]

## 2016-08-22 LAB — CBC
HCT: 30.9 % — ABNORMAL LOW (ref 36.0–46.0)
Hemoglobin: 10.7 g/dL — ABNORMAL LOW (ref 12.0–15.0)
MCH: 31.4 pg (ref 26.0–34.0)
MCHC: 34.6 g/dL (ref 30.0–36.0)
MCV: 90.6 fL (ref 78.0–100.0)
PLATELETS: 141 10*3/uL — AB (ref 150–400)
RBC: 3.41 MIL/uL — AB (ref 3.87–5.11)
RDW: 13.8 % (ref 11.5–15.5)
WBC: 6 10*3/uL (ref 4.0–10.5)

## 2016-08-22 LAB — COMPREHENSIVE METABOLIC PANEL
ALT: 14 U/L (ref 14–54)
AST: 19 U/L (ref 15–41)
Albumin: 3.1 g/dL — ABNORMAL LOW (ref 3.5–5.0)
Alkaline Phosphatase: 63 U/L (ref 38–126)
Anion gap: 11 (ref 5–15)
BUN: 6 mg/dL (ref 6–20)
CHLORIDE: 104 mmol/L (ref 101–111)
CO2: 21 mmol/L — AB (ref 22–32)
Calcium: 8.7 mg/dL — ABNORMAL LOW (ref 8.9–10.3)
Creatinine, Ser: 0.69 mg/dL (ref 0.44–1.00)
Glucose, Bld: 101 mg/dL — ABNORMAL HIGH (ref 65–99)
POTASSIUM: 3.3 mmol/L — AB (ref 3.5–5.1)
SODIUM: 136 mmol/L (ref 135–145)
Total Bilirubin: 1 mg/dL (ref 0.3–1.2)
Total Protein: 5.3 g/dL — ABNORMAL LOW (ref 6.5–8.1)

## 2016-08-22 LAB — GLUCOSE, CAPILLARY: GLUCOSE-CAPILLARY: 99 mg/dL (ref 65–99)

## 2016-08-22 LAB — HEMOGLOBIN A1C
HEMOGLOBIN A1C: 5.3 % (ref 4.8–5.6)
Mean Plasma Glucose: 105 mg/dL

## 2016-08-22 LAB — FOLATE RBC
FOLATE, RBC: 547 ng/mL (ref >498)
Folate, Hemolysate: 206.9 ng/mL
HEMATOCRIT: 37.8 % (ref 34.0–46.6)

## 2016-08-22 MED ORDER — POTASSIUM CHLORIDE 20 MEQ/15ML (10%) PO SOLN
40.0000 meq | Freq: Two times a day (BID) | ORAL | Status: DC
  Administered 2016-08-22 – 2016-08-25 (×7): 40 meq via ORAL
  Filled 2016-08-22 (×7): qty 30

## 2016-08-22 MED ORDER — ENSURE ENLIVE PO LIQD
237.0000 mL | Freq: Two times a day (BID) | ORAL | Status: DC
  Administered 2016-08-22 – 2016-08-24 (×5): 237 mL via ORAL

## 2016-08-22 MED ORDER — BOOST / RESOURCE BREEZE PO LIQD
1.0000 | Freq: Two times a day (BID) | ORAL | Status: DC
  Administered 2016-08-23 – 2016-08-24 (×4): 1 via ORAL

## 2016-08-22 NOTE — Progress Notes (Addendum)
PROGRESS NOTE    Colleen Davis  GPQ:[id number] DOB: July 11, 1945 DOA: 08/17/2016 PCP: Alroy Dust, L.Marlou Sa, MD  Brief Narrative:Colleen Davis is a 71 y.o. female with medical history significant for hypertension on HCTZ/hypokalemia, has been having ongoing nausea/vomiting since November 2017, subsequently deteriorated with failure to thrive and had a 40 pound weight loss since onset of symptoms. She was felt to have symptomatic cholelithiases and was scheduled to have elective cholecystectomy in the setting of symptomatic cholelithiasis 5/2 and was admitted by Dr.Tsuei. Unfortunately her preoperative potassium was low at 2.4 therefore surgery was canceled. Subsequently pt had worsening mentation/encephalopathy and confusion and had an extensive workup, at this time we think it is nutritional possible Wernicke encephalopathy, finally improving on Vitamin B1 and B12. Neuro consulting Also noted to have Cardiomyopathy EF 35%  Assessment & Plan:     Lethargy/encephalopathy/confusion-since admission -etiology unclear, Wernickes is a consideration, B1 pending, B12 low normal -continue B12 and B1 replacement -mentation improving slowly, opens open able to answer most of my questions this am -sedating meds cut down, was on QHS PRN lorazepam instead of xanax HS at home, stopped this 5/4  -CT head unremarkable -TSH mildly low but free T4 normal, HIV negative/RPR negative/ B12 and folate unremarkable/ESR/Ammonia normal, no evidence of infection, no fevers/leukocytosis -Appreciate Neuro input, MRI brain re-attempted 5/6 after sedation with haldol and 2doses of Ativan, without acute abnormality but limited by motion artifact -tolerating some POs-liquids-Breeze Berry    Nausea and Vomiting -Suspected to be related to symptomatic cholelithiasis -no vomiting noted now, she had long standing nausea and very poor Po intake -Has had a GI workup recently with EGD notable for gastritis, unable to have colonoscopy due  to prep -HIV negative, ESR 20 -CT Abd pelvis from 3/30 with Mild submucosal fatty deposition within cecum ascending colon without acute bowel inflammation and Cholelithiasis with gallbladder distention, never had a colonoscopy -[facility] following along and plans to eventually take her to the OR   Hypokalemia -severe due to diuretics/poor Po intake  -Replaced IV, patient refused to take this PO -Phosphorus replaced, Mag ok -K improved, low again today, replace PO by liquid KCL    HTN (hypertension) -HCTZ stopped  Cardiomyopathy EF 35% -ECHO with wall motion abnormality -TSH mildly low, T4 ok -needs a cardiac workup when mentation and overall condition better, will need Cards input when closer to surgery and mentation continues to improve   Hyperglycemia - hba1c 5.3, takes thiazide which can cause some hyperglycemia, improved    Symptomatic cholelithiasis -as above    Severe protein-calorie malnutrition ([place]) -due to persistent nausea and vomiting/above, prealbumin is 10 -increase PO intake as mentation improves  DVT prophylaxis:lovenox Code Status: Full Code Family Communication: son/daughter in law at bedside Disposition Plan: Home when improved    Subjective: More alert, sitting up in the recliner, refusing PO intake  Objective: Vitals:   08/21/16 1300 08/21/16 1835 08/21/16 2140 08/22/16 0529  BP: 130/79 (!) 112/52 111/78 136/77  Pulse: 92  85 (!) 101  Resp:   18 18  Temp:   98.3 F (36.8 C) 97.7 F (36.5 C)  TempSrc:   Oral Oral  SpO2: 100%  99% 99%  Weight:    39.9 kg (87 lb 15.4 oz)  Height:        Intake/Output Summary (Last 24 hours) at 08/22/16 1046 Last data filed at 08/21/16 1117  Gross per 24 hour  Intake  240 ml  Output                0 ml  Net              240 ml   Filed Weights   08/17/16 0627 08/17/16 0652 08/22/16 0529  Weight: 40.8 kg (90 lb) 40.8 kg (90 lb) 39.9 kg (87 lb 15.4 oz)    Examination:  Gen: Alert, awake, today,  very frail and cachectic, oriented to self and place HEENT: [place].AT,PERRAL Supple Neck,No JVD, No cervical lymphadenopathy appriciated.  LUNGS: Symmetrical Chest wall movement, Good air movement bilaterally, CTAB CVS: RRR,No Gallops,Rubs or new Murmurs,  Abd: +ve B.Sounds, Abd Soft, No tenderness, No rebound - guarding or rigidity. eXT: No edema    Data Reviewed:   CBC:  Recent Labs Lab 08/17/16 0727 08/18/16 0310 08/20/16 0529 08/21/16 0839 08/22/16 0658  WBC 6.8 7.2 8.1 7.5 6.0  NEUTROABS  --  5.8  --   --   --   HGB 12.7 11.6* 12.1 10.7* 10.7*  HCT 37.1 34.7* 36.0 31.6* 30.9*  MCV 90.9 90.1 90.0 90.8 90.6  PLT 209 206 167 164 358*   Basic Metabolic Panel:  Recent Labs Lab 08/17/16 0927  08/18/16 0310 08/19/16 0800 08/20/16 0520 08/21/16 0839 08/22/16 0658  NA  --   < > 136 134* 135 134* 136  K  --   < > 2.4* 5.0 3.7 4.5 3.3*  CL  --   < > 96* 106 99* 103 104  CO2  --   < > 25 20* 20* 18* 21*  GLUCOSE  --   < > 134* 91 93 75 101*  BUN  --   < > 11 7 7 10 6   CREATININE  --   < > 0.70 0.65 0.71 0.80 0.69  CALCIUM  --   < > 7.5* 7.7* 8.5* 8.6* 8.7*  MG 1.9  --   --   --   --   --   --   PHOS 2.2*  --  5.4*  --   --   --   --   < > = values in this interval not displayed. GFR: Estimated Creatinine Clearance: 41.8 mL/min (by C-G formula based on SCr of 0.69 mg/dL). Liver Function Tests:  Recent Labs Lab 08/17/16 0727 08/18/16 0310 08/22/16 0658  AST 20 21 19   ALT 13* 12* 14  ALKPHOS 70 66 63  BILITOT 2.0* 1.4* 1.0  PROT 6.1* 5.5* 5.3*  ALBUMIN 3.4* 3.3* 3.1*   No results for input(s): LIPASE, AMYLASE in the last 168 hours.  Recent Labs Lab 08/20/16 1928  AMMONIA 27   Coagulation Profile: No results for input(s): INR, PROTIME in the last 168 hours. Cardiac Enzymes: No results for input(s): CKTOTAL, CKMB, CKMBINDEX, TROPONINI in the last 168 hours. BNP (last 3 results) No results for input(s): PROBNP in the last 8760 hours. HbA1C:  Recent  Labs  08/21/16 1130  HGBA1C 5.3   CBG:  Recent Labs Lab 08/21/16 2136 08/22/16 0759  GLUCAP 96 99   Lipid Profile: No results for input(s): CHOL, HDL, LDLCALC, TRIG, CHOLHDL, LDLDIRECT in the last 72 hours. Thyroid Function Tests: No results for input(s): TSH, T4TOTAL, FREET4, T3FREE, THYROIDAB in the last 72 hours. Anemia Panel:  Recent Labs  08/19/16 1648  VITAMINB12 267   Urine analysis:    Component Value Date/Time   COLORURINE AMBER (A) 08/13/2016 1231   APPEARANCEUR HAZY (A) 08/13/2016 1231   LABSPEC  1.020 08/13/2016 1231   PHURINE 6.0 08/13/2016 1231   GLUCOSEU NEGATIVE 08/13/2016 1231   HGBUR NEGATIVE 08/13/2016 1231   BILIRUBINUR SMALL (A) 08/13/2016 1231   KETONESUR 80 (A) 08/13/2016 1231   PROTEINUR 30 (A) 08/13/2016 1231   UROBILINOGEN 1.0 11/16/2011 2141   NITRITE NEGATIVE 08/13/2016 1231   LEUKOCYTESUR LARGE (A) 08/13/2016 1231   Sepsis Labs: @LABRCNTIP (procalcitonin:4,lacticidven:4)  ) Recent Results (from the past 240 hour(s))  Culture, Urine     Status: Abnormal   Collection Time: 08/17/16  8:30 AM  Result Value Ref Range Status   Specimen Description URINE, RANDOM  Final   Special Requests NONE  Final   Culture MULTIPLE SPECIES PRESENT, SUGGEST RECOLLECTION (A)  Final   Report Status 08/18/2016 FINAL  Final         Radiology Studies: Mr Brain Wo Contrast  Result Date: 08/21/2016 CLINICAL DATA:  71 year old female With acute encephalopathy. Confusion and lethargy. Ocular dysmotility. EXAM: MRI HEAD WITHOUT CONTRAST TECHNIQUE: Multiplanar, multiecho pulse sequences of the brain and surrounding structures were obtained without intravenous contrast. COMPARISON:  Head CT without contrast 08/18/2016. Brain MRI 11/17/2011. FINDINGS: The examination had to be discontinued prior to completion due to patient agitation. Intermittently motion degraded diffusion imaging, sagittal T1 weighted imaging, and axial T2, FLAIR and T2* imaging was obtained.  Brain: Cerebral volume is not significantly changed since 2013 and is within normal limits for age. No restricted diffusion identified. No midline shift, mass effect, evidence of mass lesion, ventriculomegaly, extra-axial collection or acute intracranial hemorrhage. No abnormal T2 signal identified in the deep gray matter nuclei, midbrain, periaqueductal gray, brainstem or cerebellum. Scattered subcortical cerebral white matter T2 and FLAIR hyperintensity was better demonstrated in 2013 but the extent appears not significantly changed. No cortical encephalomalacia or chronic cerebral blood products are identified. Vascular: Major intracranial vascular flow voids appear stable since 2013. Skull and upper cervical spine: Grossly negative. Sinuses/Orbits: Stable and negative. Other: Mild left mastoid effusion.  Negative visible nasopharynx. IMPRESSION: No acute intracranial abnormality and no change from the 2013 brain MRI identified on this intermittently motion degraded study which had to be discontinued prior to completion due to patient agitation despite medical anxiolysis. Electronically Signed   By: Genevie Ann M.D.   On: 08/21/2016 12:19        Scheduled Meds: . carvedilol  3.125 mg Oral BID WC  . cyanocobalamin  1,000 mcg Intramuscular Daily  . enoxaparin (LOVENOX) injection  30 mg Subcutaneous Daily  . feeding supplement  1 Container Oral TID BM  . folic acid  1 mg Intravenous Daily  . magic mouthwash  10 mL Oral QID  . pantoprazole (PROTONIX) IV  40 mg Intravenous QHS   Continuous Infusions: . thiamine injection Stopped (08/21/16 2333)     LOS: 5 days    Time spent: 70mn   PDomenic Polite MD [facility] Pager [phone] If 7PM-7AM, please contact night-coverage [email or web address] Password [email or web address] 08/22/2016, 10:46 AM

## 2016-08-22 NOTE — Progress Notes (Signed)
Physical Therapy Treatment Patient Details Name: Colleen Davis MRN: [id number] DOB: 1945-04-24 Today's Date: 08/22/2016    History of Present Illness 71 year old female who presents with a one-month history of persistent nausea and vomiting as well as weight loss. PMHx: Anxiety, CKD, Depression, Hyperipidemia, HTN.    PT Comments    Pt performed increased gait and advanced to gait in halls and standing trials.  Pt continues to present with cognitive deficits.  Pt required max VCs for safety and to advance mobility.  Continue to recommend Short Term SNF placement to improve strength and functional mobility before returning home.     Follow Up Recommendations  SNF;Supervision/Assistance - 24 hour     Equipment Recommendations  None recommended by PT    Recommendations for Other Services Speech consult     Precautions / Restrictions Precautions Precautions: Fall Restrictions Weight Bearing Restrictions: No    Mobility  Bed Mobility Overal bed mobility: Needs Assistance Bed Mobility: Supine to Sit     Supine to sit: Min assist     General bed mobility comments: Cues for progressing to edge of bed, for hand placement.  Once sitting edge of bed patient is able to place socks on both of her feet with Mod cues to maintain focus on task.    Transfers Overall transfer level: Needs assistance Equipment used: Rolling walker (2 wheeled) Transfers: Sit to/from Stand Sit to Stand: Mod assist         General transfer comment: Cues for hand placement, assist to extend trunk and head.  Pt reaching for hand grip appears to have deficits with her vision.    Ambulation/Gait Ambulation/Gait assistance: Mod assist Ambulation Distance (Feet): 80 Feet Assistive device: Rolling walker (2 wheeled) Gait Pattern/deviations: Step-through pattern;Drifts right/left;Decreased stride length;Shuffle;Trunk flexed     General Gait Details: Cues for upper trunk control, assist to steer RW and to  maintain upright posture.  Pt with mild ataxia and poor ability to steer and maintain position of RW.     Stairs            Wheelchair Mobility    Modified Rankin (Stroke Patients Only)       Balance Overall balance assessment: Needs assistance Sitting-balance support: Feet supported;No upper extremity supported Sitting balance-Leahy Scale: Poor Sitting balance - Comments: No assist for sitting balance VCs for upright posture.       Standing balance-Leahy Scale: Poor Standing balance comment: Pt requires assist to maintain standing.                              Cognition Arousal/Alertness: Lethargic Behavior During Therapy: Flat affect;Impulsive Overall Cognitive Status: Impaired/Different from baseline Area of Impairment: Orientation;Attention;Following commands;Awareness;Problem solving                 Orientation Level: Disoriented to;Place;Time;Situation Current Attention Level: Focused   Following Commands: Follows one step commands inconsistently;Follows one step commands with increased time   Awareness: Intellectual Problem Solving: Slow processing;Decreased initiation;Difficulty sequencing;Requires verbal cues;Requires tactile cues General Comments: Pt reports she is in [place], able to verbalize that she needed to pee.  Pt unaware why she is hear and unable to recall time.  She is asking when can she go home.        Exercises      General Comments        Pertinent Vitals/Pain Pain Assessment: Faces Faces Pain Scale: No hurt Pain Intervention(s): Monitored during session  Home Living                      Prior Function            PT Goals (current goals can now be found in the care plan section) Acute Rehab PT Goals Patient Stated Goal: To go home Potential to Achieve Goals: Fair Progress towards PT goals: Progressing toward goals    Frequency    Min 2X/week      PT Plan Current plan remains  appropriate    Co-evaluation              AM-PAC PT "6 Clicks" Daily Activity  Outcome Measure  Difficulty turning over in bed (including adjusting bedclothes, sheets and blankets)?: A Lot Difficulty moving from lying on back to sitting on the side of the bed? : A Lot Difficulty sitting down on and standing up from a chair with arms (e.g., wheelchair, bedside commode, etc,.)?: A Lot Help needed moving to and from a bed to chair (including a wheelchair)?: A Lot Help needed walking in hospital room?: A Lot Help needed climbing 3-5 steps with a railing? : A Lot 6 Click Score: 12    End of Session Equipment Utilized During Treatment: Gait belt Activity Tolerance: Patient limited by lethargy Patient left: in bed;with call bell/phone within reach;with bed alarm set;with family/visitor present Nurse Communication: Mobility status PT Visit Diagnosis: Other abnormalities of gait and mobility (R26.89);Muscle weakness (generalized) (M62.81);Difficulty in walking, not elsewhere classified (R26.2)     Time: 1420-1450 PT Time Calculation (min) (ACUTE ONLY): 30 min  Charges:  $Gait Training: 8-22 mins $Therapeutic Activity: 8-22 mins                    G Codes:       Joycelyn Rua, PTA pager [phone]    Florestine Avers 08/22/2016, 2:59 PM

## 2016-08-22 NOTE — Progress Notes (Signed)
[facility] Progress Note  5 Days Post-Op  Subjective: CC: encephalopathy HPI and exam limited by patient mental status. This morning patient is somnolent but arousable. She is oriented to self ([place]) and place (hospital) but she is unable to tell me which city we are in or the calendar year. She denies pain.  Objective: Vital signs in last 24 hours: Temp:  [97.5 F (36.4 C)-98.3 F (36.8 C)] 97.7 F (36.5 C) (05/07 0529) Pulse Rate:  [65-101] 101 (05/07 0529) Resp:  [16-18] 18 (05/07 0529) BP: (56-136)/(37-79) 136/77 (05/07 0529) SpO2:  [98 %-100 %] 99 % (05/07 0529) Weight:  [39.9 kg (87 lb 15.4 oz)] 39.9 kg (87 lb 15.4 oz) (05/07 0529) Last BM Date:  (prior to admission)  Intake/Output from previous day: 05/06 0701 - 05/07 0700 In: 240 [P.O.:240] Out: -  Intake/Output this shift: No intake/output data recorded.  PE: Gen:  Somnolent, no acute distress Eyes: PERRL, no scleral icterus  Card:  Regular rate and rhythm, pedal pulses 2+ BL Pulm:  Normal effort, clear to auscultation bilaterally Abd: Soft, non-tender, non-distended, bowel sounds present Skin: warm and dry, no rashes  MSK: muscle wasting in bilateral upper and lower extremities Neuro: somnolent, following commands, strength is normal   Lab Results:   Recent Labs  08/21/16 0839 08/22/16 0658  WBC 7.5 6.0  HGB 10.7* 10.7*  HCT 31.6* 30.9*  PLT 164 141*   BMET  Recent Labs  08/20/16 0520 08/21/16 0839  NA 135 134*  K 3.7 4.5  CL 99* 103  CO2 20* 18*  GLUCOSE 93 75  BUN 7 10  CREATININE 0.71 0.80  CALCIUM 8.5* 8.6*   PT/INR No results for input(s): LABPROT, INR in the last 72 hours. CMP     Component Value Date/Time   NA 134 (L) 08/21/2016 0839   K 4.5 08/21/2016 0839   CL 103 08/21/2016 0839   CO2 18 (L) 08/21/2016 0839   GLUCOSE 75 08/21/2016 0839   BUN 10 08/21/2016 0839   CREATININE 0.80 08/21/2016 0839   CALCIUM 8.6 (L) 08/21/2016 0839   PROT 5.5 (L) 08/18/2016  0310   ALBUMIN 3.3 (L) 08/18/2016 0310   AST 21 08/18/2016 0310   ALT 12 (L) 08/18/2016 0310   ALKPHOS 66 08/18/2016 0310   BILITOT 1.4 (H) 08/18/2016 0310   GFRNONAA >60 08/21/2016 0839   GFRAA >60 08/21/2016 0839   Lipase     Component Value Date/Time   LIPASE 30 08/13/2016 0813       Studies/Results: Mr Brain Wo Contrast  Result Date: 08/21/2016 CLINICAL DATA:  71 year old female With acute encephalopathy. Confusion and lethargy. Ocular dysmotility. EXAM: MRI HEAD WITHOUT CONTRAST TECHNIQUE: Multiplanar, multiecho pulse sequences of the brain and surrounding structures were obtained without intravenous contrast. COMPARISON:  Head CT without contrast 08/18/2016. Brain MRI 11/17/2011. FINDINGS: The examination had to be discontinued prior to completion due to patient agitation. Intermittently motion degraded diffusion imaging, sagittal T1 weighted imaging, and axial T2, FLAIR and T2* imaging was obtained. Brain: Cerebral volume is not significantly changed since 2013 and is within normal limits for age. No restricted diffusion identified. No midline shift, mass effect, evidence of mass lesion, ventriculomegaly, extra-axial collection or acute intracranial hemorrhage. No abnormal T2 signal identified in the deep gray matter nuclei, midbrain, periaqueductal gray, brainstem or cerebellum. Scattered subcortical cerebral white matter T2 and FLAIR hyperintensity was better demonstrated in 2013 but the extent appears not significantly changed. No cortical encephalomalacia or chronic cerebral blood  products are identified. Vascular: Major intracranial vascular flow voids appear stable since 2013. Skull and upper cervical spine: Grossly negative. Sinuses/Orbits: Stable and negative. Other: Mild left mastoid effusion.  Negative visible nasopharynx. IMPRESSION: No acute intracranial abnormality and no change from the 2013 brain MRI identified on this intermittently motion degraded study which had to be  discontinued prior to completion due to patient agitation despite medical anxiolysis. Electronically Signed   By: Odessa Fleming M.D.   On: 08/21/2016 12:19    Anti-infectives: Anti-infectives    Start     Dose/Rate Route Frequency Ordered Stop   08/17/16 0641  ceFAZolin (ANCEF) IVPB 2g/100 mL premix  Status:  Discontinued     2 g 200 mL/hr over 30 Minutes Intravenous On call to O.R. 08/17/16 6384 08/17/16 0829     Assessment/Plan Symptomatic cholelithiasis  - scheduled for lap chole 08/17/16 but cancelled 2/2 hypokalemia  - LFT's WNL, no leukocytosis  - will continue to follow for possible surgery later this week.   Nausea/vomiting - suspect 2/2 above   Encephalopathy/confusion - continued workup per primary team. Neuro following. Re-attempt MRI brain as pt tolerates. Minimize CNS active meds, sleep hygiene.  HTN HLD  FEN - full liquid diet, hypokalemia (3.3) protein-calorie malnutrition ID - none VTE - lovenox, SCD's     LOS: 5 days    Adam Phenix , [facility] Surgery 08/22/2016, 7:49 AM Pager: [phone] Consults: [phone] Mon-Fri 7:00 am-4:30 pm Sat-Sun 7:00 am-11:30 am

## 2016-08-22 NOTE — Progress Notes (Signed)
Nutrition Follow-up  DOCUMENTATION CODES:   Severe malnutrition in context of acute illness/injury, Underweight  INTERVENTION:   -Decrease Boost Breeze po to BID, each supplement provides 250 kcal and 9 grams of protein -Ensure Enlive po BID, each supplement provides 350 kcal and 20 grams of protein  NUTRITION DIAGNOSIS:   Malnutrition (severe) related to acute illness (cholelithiasis) as evidenced by severe depletion of body fat, severe depletion of muscle mass, energy intake < or equal to 50% for > or equal to 1 month.  Ongoing  GOAL:   Patient will meet greater than or equal to 90% of their needs  Unmet  MONITOR:   Diet advancement, Labs, Weight trends, I & O's  REASON FOR ASSESSMENT:   Consult Assessment of nutrition requirement/status  ASSESSMENT:   71 yo Female who presented with a one-month history of persistent nausea and vomiting as well as weight loss. PMHx: Anxiety, CKD, Depression, Hyperipidemia, HTN.  Pt sleeping soundly at time of visit. No family present in room. RD did not disturb.   Pt underwent BSE on 08/19/16, which recommended NPO status; upgraded to thin liquids on 08/20/16. Pt is currently on a full liquid diet, with continued minimal PO intake secondary to mentation and nausea. She was able to consume a small amount of Boost Breeze supplement this AM. Meal completion 0-25%. Due to diet advancement, will trial Ensure along with Boost Breeze due to increased nutrient density and attempt to promote acceptability due to variety of supplements.   Labs reviewed: K: 3.3 (on PO supplementation).  Diet Order:  Diet full liquid Room service appropriate? Yes; Fluid consistency: Thin  Skin:  Reviewed, no issues  Last BM:  PTA  Height:   Ht Readings from Last 1 Encounters:  08/17/16 5' 2.5" (1.588 m)    Weight:   Wt Readings from Last 1 Encounters:  08/22/16 87 lb 15.4 oz (39.9 kg)    Ideal Body Weight:  50 kg  BMI:  Body mass index is 15.83  kg/m.  Estimated Nutritional Needs:   Kcal:  1200-1400  Protein:  60-70 gm  Fluid:  >/= 1.5 L  EDUCATION NEEDS:   No education needs identified at this time  Kaslyn Richburg A. Mayford Knife, RD, LDN, CDE Pager: [phone] After hours Pager: [phone]

## 2016-08-22 NOTE — Care Management Important Message (Signed)
Important Message  Patient Details  Name: Colleen Davis MRN: [id number] Date of Birth: December 01, 1945   Medicare Important Message Given:  Yes    Purl Claytor Abena 08/22/2016, 2:09 PM

## 2016-08-22 NOTE — Progress Notes (Signed)
  Speech Language Pathology Treatment: Dysphagia  Patient Details Name: Colleen Davis MRN: [id number] DOB: 1945/07/26 Today's Date: 08/22/2016 Time: 8413-2440 SLP Time Calculation (min) (ACUTE ONLY): 20 min  Assessment / Plan / Recommendation Clinical Impression  Pt's cognitive status essentially the same as this SLP's previous session. Max-total cues to attend to task. Intake during session mildly increased with anterior spill towards the end and mild expectoration. Approximately 7 swallows initiated with liquid via spoon and cup without s/s aspiration. Pt currently unable to meet her nutritional needs; consider short term alternate means of nutrition until cognition improves and pt able to increase intake.    HPI HPI: 71 year old female who presents with a one-month history of persistent nausea and vomiting as well as weight loss. Found to have gallstonnes. PMHx: Anxiety, CKD, Depression, Hyperipidemia, HTN      SLP Plan  Continue with current plan of care       Recommendations  Diet recommendations: Thin liquid (thin, full liquids) Liquids provided via: Cup;Teaspoon Medication Administration: Crushed with puree Supervision: Full supervision/cueing for compensatory strategies;Staff to assist with self feeding Compensations: Slow rate;Small sips/bites;Minimize environmental distractions;Monitor for anterior loss Postural Changes and/or Swallow Maneuvers: Seated upright 90 degrees                Oral Care Recommendations: Oral care QID Follow up Recommendations: Skilled Nursing facility SLP Visit Diagnosis: Dysphagia, unspecified (R13.10) Plan: Continue with current plan of care       GO                Royce Macadamia 08/22/2016, 3:10 PM  Breck Coons Katie Moch M.Ed [employer] [phone]

## 2016-08-23 LAB — CBC
HEMATOCRIT: 31.3 % — AB (ref 36.0–46.0)
HEMOGLOBIN: 10.9 g/dL — AB (ref 12.0–15.0)
MCH: 31.3 pg (ref 26.0–34.0)
MCHC: 34.8 g/dL (ref 30.0–36.0)
MCV: 89.9 fL (ref 78.0–100.0)
Platelets: 196 10*3/uL (ref 150–400)
RBC: 3.48 MIL/uL — ABNORMAL LOW (ref 3.87–5.11)
RDW: 13.9 % (ref 11.5–15.5)
WBC: 4.8 10*3/uL (ref 4.0–10.5)

## 2016-08-23 LAB — BASIC METABOLIC PANEL
ANION GAP: 12 (ref 5–15)
BUN: 7 mg/dL (ref 6–20)
CALCIUM: 8.5 mg/dL — AB (ref 8.9–10.3)
CHLORIDE: 105 mmol/L (ref 101–111)
CO2: 22 mmol/L (ref 22–32)
CREATININE: 0.76 mg/dL (ref 0.44–1.00)
GFR calc non Af Amer: >60 mL/min (ref >60)
Glucose, Bld: 94 mg/dL (ref 65–99)
Potassium: 3.1 mmol/L — ABNORMAL LOW (ref 3.5–5.1)
SODIUM: 139 mmol/L (ref 135–145)

## 2016-08-23 NOTE — Progress Notes (Signed)
Occupational Therapy Treatment Patient Details Name: Colleen Davis MRN: [id number] DOB: 06/22/1945 Today's Date: 08/23/2016    History of present illness 71 year old female who presents with a one-month history of persistent nausea and vomiting as well as weight loss. PMHx: Anxiety, CKD, Depression, Hyperipidemia, HTN.   OT comments  Pt continues to demonstrate a poor appetite, confusion and significant weakness. Performed transfer with min assist. Required min to max assist for seated grooming and UB dressing. Continue to recommend SNF for rehab. Will follow.  Follow Up Recommendations  SNF;Supervision/Assistance - 24 hour    Equipment Recommendations       Recommendations for Other Services      Precautions / Restrictions Precautions Precautions: Fall       Mobility Bed Mobility Overal bed mobility: Needs Assistance Bed Mobility: Supine to Sit     Supine to sit: Min assist     General bed mobility comments: maximal multimodal cues and min assist  Transfers Overall transfer level: Needs assistance Equipment used: 1 person hand held assist Transfers: Sit to/from Stand;Stand Pivot Transfers Sit to Stand: Min assist Stand pivot transfers: Min assist       General transfer comment: assist to rise and for balance    Balance Overall balance assessment: Needs assistance Sitting-balance support: Feet supported;No upper extremity supported Sitting balance-Leahy Scale: Fair Sitting balance - Comments: no LOB with static sitting     Standing balance-Leahy Scale: Poor Standing balance comment: requires at least one hand hand or on surface to maintain balance                           ADL either performed or assessed with clinical judgement   ADL Overall ADL's : Needs assistance/impaired   Eating/Feeding Details (indicate cue type and reason): pt refusing all oral intake Grooming: Wash/dry hands;Wash/dry face;Sitting;Minimal assistance;Brushing  hair;Maximal assistance           Upper Body Dressing : Maximal assistance;Sitting Upper Body Dressing Details (indicate cue type and reason): front opening gown                   General ADL Comments: pt agreeable to OOB to chair     Vision       Perception     Praxis      Cognition Arousal/Alertness: Awake/alert Behavior During Therapy: Flat affect Overall Cognitive Status: Impaired/Different from baseline Area of Impairment: Orientation;Attention;Following commands;Awareness;Problem solving                 Orientation Level: Disoriented to;Place;Situation Current Attention Level: Sustained   Following Commands: Follows one step commands inconsistently;Follows one step commands with increased time   Awareness: Intellectual Problem Solving: Slow processing;Decreased initiation;Difficulty sequencing;Requires verbal cues;Requires tactile cues General Comments: pt thought her gall bladder had been removed, tangential conversation        Exercises     Shoulder Instructions       General Comments      Pertinent Vitals/ Pain       Pain Assessment: Faces Faces Pain Scale: No hurt  Home Living                                          Prior Functioning/Environment              Frequency  Min 2X/week  Progress Toward Goals  OT Goals(current goals can now be found in the care plan section)  Progress towards OT goals: Progressing toward goals  Acute Rehab OT Goals Patient Stated Goal: To go home Time For Goal Achievement: 09/01/16 Potential to Achieve Goals: Fair  Plan Discharge plan remains appropriate    Co-evaluation                 AM-PAC PT "6 Clicks" Daily Activity     Outcome Measure   Help from another person eating meals?: A Lot Help from another person taking care of personal grooming?: A Lot Help from another person toileting, which includes using toliet, bedpan, or urinal?: Total Help  from another person bathing (including washing, rinsing, drying)?: Total Help from another person to put on and taking off regular upper body clothing?: A Lot Help from another person to put on and taking off regular lower body clothing?: Total 6 Click Score: 9    End of Session Equipment Utilized During Treatment: Gait belt  OT Visit Diagnosis: Unsteadiness on feet (R26.81);Muscle weakness (generalized) (M62.81)   Activity Tolerance Patient tolerated treatment well   Patient Left in chair;with call bell/phone within reach;with chair alarm set   Nurse Communication          Time: [phone] OT Time Calculation (min): 26 min  Charges: OT General Charges $OT Visit: 1 Procedure OT Treatments $Self Care/Home Management : 23-37 mins    Evern Bio 08/23/2016, 10:08 AM  [phone]

## 2016-08-23 NOTE — Progress Notes (Signed)
[facility] Progress Note  6 Days Post-Op  Subjective: CC: encephalopathy Remains confused, but more alert compared to yesterday. Denies abdominal pain.   MRI performed yesterday - no acute abnormalities  Afebrile, VSS Objective: Vital signs in last 24 hours: Temp:  [97.4 F (36.3 C)-98.1 F (36.7 C)] 97.7 F (36.5 C) (05/08 0439) Pulse Rate:  [74-100] 80 (05/08 0439) Resp:  [16-18] 16 (05/08 0439) BP: (100-130)/(64-96) 130/77 (05/08 0439) SpO2:  [98 %-99 %] 98 % (05/08 0439) Last BM Date:  (PTA)  Intake/Output from previous day: 05/07 0701 - 05/08 0700 In: 50 [P.O.:50] Out: -  Intake/Output this shift: No intake/output data recorded.  PE: Gen: cooperative, no acute distress Eyes: PERRL, no scleral icterus  Card:  Regular rate and rhythm, no m/r/g Pulm:  Normal effort, clear to auscultation bilaterally Abd: Soft, non-tender, non-distended, bowel sounds present Skin: warm and dry, no rashes  MSK: muscle wasting in bilateral upper and lower extremities Neuro: somnolent, following commands Psych: oriented to person, not place or time   Lab Results:   Recent Labs  08/22/16 0658 08/23/16 0611  WBC 6.0 4.8  HGB 10.7* 10.9*  HCT 30.9* 31.3*  PLT 141* 196   BMET  Recent Labs  08/22/16 0658 08/23/16 0611  NA 136 139  K 3.3* 3.1*  CL 104 105  CO2 21* 22  GLUCOSE 101* 94  BUN 6 7  CREATININE 0.69 0.76  CALCIUM 8.7* 8.5*   PT/INR No results for input(s): LABPROT, INR in the last 72 hours. CMP     Component Value Date/Time   NA 139 08/23/2016 0611   K 3.1 (L) 08/23/2016 0611   CL 105 08/23/2016 0611   CO2 22 08/23/2016 0611   GLUCOSE 94 08/23/2016 0611   BUN 7 08/23/2016 0611   CREATININE 0.76 08/23/2016 0611   CALCIUM 8.5 (L) 08/23/2016 0611   PROT 5.3 (L) 08/22/2016 0658   ALBUMIN 3.1 (L) 08/22/2016 0658   AST 19 08/22/2016 0658   ALT 14 08/22/2016 0658   ALKPHOS 63 08/22/2016 0658   BILITOT 1.0 08/22/2016 0658   GFRNONAA >60  08/23/2016 0611   GFRAA >60 08/23/2016 0611   Lipase     Component Value Date/Time   LIPASE 30 08/13/2016 0813       Studies/Results: Mr Brain Wo Contrast  Result Date: 08/21/2016 CLINICAL DATA:  71 year old female With acute encephalopathy. Confusion and lethargy. Ocular dysmotility. EXAM: MRI HEAD WITHOUT CONTRAST TECHNIQUE: Multiplanar, multiecho pulse sequences of the brain and surrounding structures were obtained without intravenous contrast. COMPARISON:  Head CT without contrast 08/18/2016. Brain MRI 11/17/2011. FINDINGS: The examination had to be discontinued prior to completion due to patient agitation. Intermittently motion degraded diffusion imaging, sagittal T1 weighted imaging, and axial T2, FLAIR and T2* imaging was obtained. Brain: Cerebral volume is not significantly changed since 2013 and is within normal limits for age. No restricted diffusion identified. No midline shift, mass effect, evidence of mass lesion, ventriculomegaly, extra-axial collection or acute intracranial hemorrhage. No abnormal T2 signal identified in the deep gray matter nuclei, midbrain, periaqueductal gray, brainstem or cerebellum. Scattered subcortical cerebral white matter T2 and FLAIR hyperintensity was better demonstrated in 2013 but the extent appears not significantly changed. No cortical encephalomalacia or chronic cerebral blood products are identified. Vascular: Major intracranial vascular flow voids appear stable since 2013. Skull and upper cervical spine: Grossly negative. Sinuses/Orbits: Stable and negative. Other: Mild left mastoid effusion.  Negative visible nasopharynx. IMPRESSION: No acute intracranial abnormality and no  change from the 2013 brain MRI identified on this intermittently motion degraded study which had to be discontinued prior to completion due to patient agitation despite medical anxiolysis. Electronically Signed   By: Odessa Fleming M.D.   On: 08/21/2016 12:19     Anti-infectives: Anti-infectives    Start     Dose/Rate Route Frequency Ordered Stop   08/17/16 0641  ceFAZolin (ANCEF) IVPB 2g/100 mL premix  Status:  Discontinued     2 g 200 mL/hr over 30 Minutes Intravenous On call to O.R. 08/17/16 6333 08/17/16 0829     Assessment/Plan Symptomatic cholelithiasis  - scheduled for lap chole 08/17/16 but cancelled 2/2 hypokalemia  - LFT's WNL, no leukocytosis  - will continue to follow for possible surgery later this week.   Nausea/vomiting - suspect 2/2 above   Encephalopathy/confusion - continued workup per primary team. Neuro following. Re-attempt MRI brain as pt tolerates. Minimize CNS active meds, sleep hygiene.  HTN HLD  FEN - full liquid diet, hypokalemia, protein-calorie malnutrition ID - none VTE - lovenox, SCD's    LOS: 6 days    Adam Phenix , [facility] Surgery 08/23/2016, 7:43 AM Pager: [phone] Consults: [phone] Mon-Fri 7:00 am-4:30 pm Sat-Sun 7:00 am-11:30 am

## 2016-08-23 NOTE — Progress Notes (Signed)
PROGRESS NOTE    Colleen Davis  BSW:[id number] DOB: Mar 30, 1946 DOA: 08/17/2016 PCP: Alroy Dust, L.Marlou Sa, MD  Brief Narrative:Colleen Davis is a 71 y.o. female with medical history significant for hypertension on HCTZ/hypokalemia, has been having ongoing nausea/vomiting since November 2017, subsequently deteriorated with failure to thrive and had a 40 pound weight loss since onset of symptoms. She was felt to have symptomatic cholelithiases and was scheduled to have elective cholecystectomy in the setting of symptomatic cholelithiasis 5/2 and was admitted by Dr.Tsuei. Unfortunately her preoperative potassium was low at 2.4 therefore surgery was canceled. Subsequently pt had worsening mentation/encephalopathy and confusion and had an extensive workup, at this time we think it is nutritional possible Wernicke encephalopathy, finally improving on Vitamin B1 and B12. Neuro consulting Also noted to have Cardiomyopathy EF 35%  Assessment & Plan:     Lethargy/encephalopathy/confusion-since admission -etiology unclear, Wernickes is a consideration, B1 pending, B12 low normal -continue B12 and B1 replacement -mentation improving slowly, opens open able to answer most of my questions this am -sedating meds cut down, was on QHS PRN lorazepam instead of xanax HS at home, stopped this 5/4  -CT head unremarkable -TSH mildly low but free T4 normal, HIV negative/RPR negative/ B12 and folate unremarkable/ESR/Ammonia normal, no evidence of infection, no fevers/leukocytosis -Appreciate Neuro input, MRI brain re-attempted 5/6 after sedation with haldol and 2doses of Ativan, without acute abnormality but limited by motion artifact -tolerating some POs-liquids-Breeze Berry    Nausea and Vomiting -Suspected to be related to symptomatic cholelithiasis -no vomiting noted now, she had long standing nausea and very poor Po intake -Has had a GI workup recently with EGD notable for gastritis, unable to have colonoscopy due  to prep -HIV negative, ESR 20 -CT Abd pelvis from 3/30 with Mild submucosal fatty deposition within cecum ascending colon without acute bowel inflammation and Cholelithiasis with gallbladder distention, never had a colonoscopy -[facility] following along and plans to eventually take her to the OR   Hypokalemia -severe due to diuretics/poor Po intake  -Replaced IV, patient refused to take this PO -Phosphorus replaced, Mag ok -K improved, low again today, replace PO by liquid KCL    HTN (hypertension) -HCTZ stopped  Cardiomyopathy EF 35% -ECHO with wall motion abnormality -TSH mildly low, T4 ok -needs a cardiac workup when mentation and overall condition better, will need Cards input when closer to surgery and mentation continues to improve   Hyperglycemia - hba1c 5.3, takes thiazide which can cause some hyperglycemia, improved    Symptomatic cholelithiasis -as above    Severe protein-calorie malnutrition ([place]) -due to persistent nausea and vomiting/above, prealbumin is 10 -increase PO intake as mentation improves  DVT prophylaxis:lovenox Code Status: Full Code Family Communication: son/daughter in law at bedside Disposition Plan: Home when improved    Subjective: More alert, sitting up in the recliner, refusing PO intake  Objective: Vitals:   08/22/16 1657 08/22/16 1727 08/22/16 2132 08/23/16 0439  BP: (!) 124/96 101/77 100/64 130/77  Pulse: 93 100 74 80  Resp:  _0 Temp:  97.4 F (36.3 C) 98.1 F (36.7 C) 97.7 F (36.5 C)  TempSrc:  Oral Oral Oral  SpO2:  98% 99% 98%  Weight:      Height:       No intake or output data in the 24 hours ending 08/23/16 1336 Filed Weights   08/17/16 0627 08/17/16 0652 08/22/16 0529  Weight: 40.8 kg (90 lb) 40.8 kg (90 lb) 39.9 kg (87 lb 15.4 oz)  Examination:  Awake Alert, Oriented X to self and place, frail cachectic  HEENT: PERRLA Supple Neck,No JVD, No cervical lymphadenopathy appriciated.  Lungs: Symmetrical Chest  wall movement, Good air movement bilaterally, CTAB CVS: RRR,No Gallops,Rubs or new Murmurs Abd: +ve B.Sounds, Abd Soft, No tenderness, No rebound - guarding or rigidity. Ext: no edema     Data Reviewed:   CBC:  Recent Labs Lab 08/18/16 0310 08/19/16 1649 08/20/16 0529 08/21/16 0839 08/22/16 0658 08/23/16 0611  WBC 7.2  --  8.1 7.5 6.0 4.8  NEUTROABS 5.8  --   --   --   --   --   HGB 11.6*  --  12.1 10.7* 10.7* 10.9*  HCT 34.7* 37.8 36.0 31.6* 30.9* 31.3*  MCV 90.1  --  90.0 90.8 90.6 89.9  PLT 206  --  167 164 141* 329   Basic Metabolic Panel:  Recent Labs Lab 08/17/16 0927  08/18/16 0310 08/19/16 0800 08/20/16 0520 08/21/16 0839 08/22/16 0658 08/23/16 0611  NA  --   < > 136 134* 135 134* 136 139  K  --   < > 2.4* 5.0 3.7 4.5 3.3* 3.1*  CL  --   < > 96* 106 99* 103 104 105  CO2  --   < > 25 20* 20* 18* 21* 22  GLUCOSE  --   < > 134* 91 93 75 101* 94  BUN  --   < > _0 CREATININE  --   < > 0.70 0.65 0.71 0.80 0.69 0.76  CALCIUM  --   < > 7.5* 7.7* 8.5* 8.6* 8.7* 8.5*  MG 1.9  --   --   --   --   --   --   --   PHOS 2.2*  --  5.4*  --   --   --   --   --   < > = values in this interval not displayed. GFR: Estimated Creatinine Clearance: 41.8 mL/min (by C-G formula based on SCr of 0.76 mg/dL). Liver Function Tests:  Recent Labs Lab 08/17/16 0727 08/18/16 0310 08/22/16 0658  AST _1 ALT 13* 12* 14  ALKPHOS 70 66 63  BILITOT 2.0* 1.4* 1.0  PROT 6.1* 5.5* 5.3*  ALBUMIN 3.4* 3.3* 3.1*   No results for input(s): LIPASE, AMYLASE in the last 168 hours.  Recent Labs Lab 08/20/16 1928  AMMONIA 27   Coagulation Profile: No results for input(s): INR, PROTIME in the last 168 hours. Cardiac Enzymes: No results for input(s): CKTOTAL, CKMB, CKMBINDEX, TROPONINI in the last 168 hours. BNP (last 3 results) No results for input(s): PROBNP in the last 8760 hours. HbA1C:  Recent Labs  08/21/16 1130  HGBA1C 5.3   CBG:  Recent Labs Lab  08/21/16 2136 08/22/16 0759  GLUCAP 96 99   Lipid Profile: No results for input(s): CHOL, HDL, LDLCALC, TRIG, CHOLHDL, LDLDIRECT in the last 72 hours. Thyroid Function Tests: No results for input(s): TSH, T4TOTAL, FREET4, T3FREE, THYROIDAB in the last 72 hours. Anemia Panel: No results for input(s): VITAMINB12, FOLATE, FERRITIN, TIBC, IRON, RETICCTPCT in the last 72 hours. Urine analysis:    Component Value Date/Time   COLORURINE AMBER (A) 08/13/2016 1231   APPEARANCEUR HAZY (A) 08/13/2016 1231   LABSPEC 1.020 08/13/2016 1231   PHURINE 6.0 08/13/2016 1231   GLUCOSEU NEGATIVE 08/13/2016 1231   HGBUR NEGATIVE 08/13/2016 1231   BILIRUBINUR SMALL (A) 08/13/2016 1231   KETONESUR 80 (A)  08/13/2016 1231   PROTEINUR 30 (A) 08/13/2016 1231   UROBILINOGEN 1.0 11/16/2011 2141   NITRITE NEGATIVE 08/13/2016 1231   LEUKOCYTESUR LARGE (A) 08/13/2016 1231   Sepsis Labs: _0 (procalcitonin:4,lacticidven:4)  ) Recent Results (from the past 240 hour(s))  Culture, Urine     Status: Abnormal   Collection Time: 08/17/16  8:30 AM  Result Value Ref Range Status   Specimen Description URINE, RANDOM  Final   Special Requests NONE  Final   Culture MULTIPLE SPECIES PRESENT, SUGGEST RECOLLECTION (A)  Final   Report Status 08/18/2016 FINAL  Final         Radiology Studies: No results found.      Scheduled Meds: . carvedilol  3.125 mg Oral BID WC  . cyanocobalamin  1,000 mcg Intramuscular Daily  . enoxaparin (LOVENOX) injection  30 mg Subcutaneous Daily  . feeding supplement  1 Container Oral BID BM  . feeding supplement (ENSURE ENLIVE)  237 mL Oral BID BM  . folic acid  1 mg Intravenous Daily  . magic mouthwash  10 mL Oral QID  . pantoprazole (PROTONIX) IV  40 mg Intravenous QHS  . potassium chloride  40 mEq Oral BID   Continuous Infusions: . thiamine injection Stopped (08/23/16 1129)     LOS: 6 days    Time spent: 53mn   PDomenic Polite MD [facility] Pager  [phone] If 7PM-7AM, please contact night-coverage [email or web address] Password [email or web address] 08/23/2016, 1:36 PM

## 2016-08-24 LAB — CREATININE, SERUM
Creatinine, Ser: 0.89 mg/dL (ref 0.44–1.00)
GFR calc Af Amer: >60 mL/min (ref >60)

## 2016-08-24 MED ORDER — FOLIC ACID 1 MG PO TABS
1.0000 mg | ORAL_TABLET | Freq: Every day | ORAL | Status: DC
  Administered 2016-08-25: 1 mg via ORAL
  Filled 2016-08-24: qty 1

## 2016-08-24 NOTE — Progress Notes (Signed)
Physical Therapy Treatment Patient Details Name: Colleen Davis MRN: [id number] DOB: Jul 08, 1945 Today's Date: 08/24/2016    History of Present Illness 71 year old female who presents with a one-month history of persistent nausea and vomiting as well as weight loss. PMHx: Anxiety, CKD, Depression, Hyperipidemia, HTN.    PT Comments    Pt presents with improved functional mobility this session, remains significantly limited requiring physical assist for basic mobility tasks; remains lethargic and confused but able to participate to limits of fatigue. Endorses dizziness with position changes and BP consistent with orthostatic hypotension (sitting = 109/77 >> standing = 92/58 with c/o mild symptoms and no or delayed resolution. Continue to recommend SNF at d/c to address rehab needs.  Discussed with daughter in law who endorses placement as well. See below for details of session.    Follow Up Recommendations  SNF;Supervision/Assistance - 24 hour     Equipment Recommendations  Rolling walker with 5" wheels (to be provided at [facility])    Recommendations for Other Services       Precautions / Restrictions Precautions Precautions: Fall Precaution Comments: chair/bed alarm, video room, up with assist only Restrictions Weight Bearing Restrictions: No    Mobility  Bed Mobility Overal bed mobility: Needs Assistance Bed Mobility: Rolling Rolling: Min assist         General bed mobility comments: required maximal verbal cueing and min assist for rolling to sidelying including cues to use rail and physical assist to elevate trunk into upright  Transfers Overall transfer level: Needs assistance Equipment used: Rolling walker (2 wheeled) Transfers: Sit to/from Stand Sit to Stand: Min assist         General transfer comment: assist for physical support and balance; able to stand from recliner with verbal cues and supervision but needs steady assist upon standing due to balance  impairment  Ambulation/Gait Ambulation/Gait assistance: Mod assist (assist primarily for rolling walker management) Ambulation Distance (Feet): 150 Feet Assistive device: Rolling walker (2 wheeled) Gait Pattern/deviations: Ataxic;Drifts right/left;Step-through pattern;Shuffle;Decreased stride length;Narrow base of support;Trunk flexed Gait velocity: unmeasured but decreased Gait velocity interpretation: Below normal speed for age/gender General Gait Details: Verbal cues and manual assist for steering RW; verbal and tactile cues for upright posture; verbal cues for eyes open; verbal cues for direction changes; increased physical assist for turning 90-180 degrees   Stairs            Wheelchair Mobility    Modified Rankin (Stroke Patients Only)       Balance Overall balance assessment: Needs assistance Sitting-balance support: Feet unsupported;Feet supported Sitting balance-Leahy Scale: Fair Sitting balance - Comments: continues with stable sitting balance unchallenged; able to reciprocal scoot to edge of bed/chair with UE support and cues for technique Postural control: Other (comment) (see gait, strong drift to R; braces legs at chair in stand) Standing balance support: During functional activity;No upper extremity supported Standing balance-Leahy Scale: Poor Standing balance comment: requires UE support or min assist of 1 to maintain static standing balance         Rhomberg - Eyes Opened: 0 (immediately widens base and arms akimbo)                  Cognition Arousal/Alertness: Lethargic (arouses to vocal stim immediately) Behavior During Therapy: Flat affect Overall Cognitive Status: Impaired/Different from baseline (per daughter in law, pt normal cognition 1-2d before admit) Area of Impairment: Orientation;Attention;Following commands;Awareness                 Orientation Level:  Place (states [place], then confabulates) Current Attention Level:  Sustained   Following Commands: Follows one step commands inconsistently;Follows one step commands with increased time   Awareness: Intellectual (able to state unsafe to be up alone, not observed otherwise) Problem Solving: Slow processing;Requires verbal cues;Decreased initiation;Requires tactile cues General Comments: pt reported "I don't think i'm pregnant", and "I want to stay on the bus"      Exercises      General Comments General comments (skin integrity, edema, etc.): see vital, pt c/o dizziness/wooziness upon standing; ortho BP 109/77 sitting to 92/58 upon standing; symptoms mild and steady or with slight improvement over time/with repetition; pt with h/o tinnitus in context of ataxia and dizziness and right drift suspicious for vestibular dysfunction, please assess as able to tolerate      Pertinent Vitals/Pain Pain Assessment: No/denies pain Faces Pain Scale: No hurt    Home Living                      Prior Function            PT Goals (current goals can now be found in the care plan section) Progress towards PT goals: Progressing toward goals    Frequency    Min 2X/week      PT Plan Current plan remains appropriate    Co-evaluation              AM-PAC PT "6 Clicks" Daily Activity  Outcome Measure  Difficulty turning over in bed (including adjusting bedclothes, sheets and blankets)?: A Little Difficulty moving from lying on back to sitting on the side of the bed? : A Lot Difficulty sitting down on and standing up from a chair with arms (e.g., wheelchair, bedside commode, etc,.)?: A Lot Help needed moving to and from a bed to chair (including a wheelchair)?: A Lot Help needed walking in hospital room?: A Lot Help needed climbing 3-5 steps with a railing? : A Lot 6 Click Score: 13    End of Session Equipment Utilized During Treatment: Gait belt Activity Tolerance: Patient limited by lethargy;Patient limited by fatigue (improved today but  continues to limit) Patient left: in chair;with chair alarm set;with call bell/phone within reach Nurse Communication: Mobility status PT Visit Diagnosis: Muscle weakness (generalized) (M62.81);Unsteadiness on feet (R26.81);Ataxic gait (R26.0)     Time: 9024-0973 PT Time Calculation (min) (ACUTE ONLY): 45 min  Charges:  $Gait Training: 8-22 mins $Therapeutic Activity: 23-37 mins                    G Codes:          Dennis Bast 08/24/2016, 1:03 PM

## 2016-08-24 NOTE — Progress Notes (Addendum)
  Speech Language Pathology Treatment: Dysphagia  Patient Details Name: Colleen Davis MRN: [id number] DOB: [birth year] Today's Date: 08/24/2016 Time: 0938-1829 SLP Time Calculation (min) (ACUTE ONLY): 15 min  Assessment / Plan / Recommendation Clinical Impression  Pt's alertness, awareness and sustained attention were much improved this morning! Required only minimal verbal cues to participate in trials of increased textures. Oral mastication, control and transit (with pt taking small bites) functional. No s/s aspiration with straw sips thin grape soda. If pt can maintain this level of alertness and cognitive status, from a dysphagia standpoint, recommend Dys 2 texture and continue thin liquids, straws allowed, crush meds and still needs full supervision. Will inquire re: upgraded diet with MD due to gallbladder issues.    Addendum: spoke with Dr. Allena Katz who agrees with Dys 2 texture.   HPI HPI: 71 year old female who presents with a one-month history of persistent nausea and vomiting as well as weight loss. Found to have gallstonnes. PMHx: Anxiety, CKD, Depression, Hyperipidemia, HTN      SLP Plan  Continue with current plan of care       Recommendations  Diet recommendations: Dysphagia 2 (fine chop);Thin liquid Liquids provided via: Cup;Straw Medication Administration: Crushed with puree Supervision: Full supervision/cueing for compensatory strategies;Staff to assist with self feeding Compensations: Slow rate;Small sips/bites;Minimize environmental distractions;Monitor for anterior loss Postural Changes and/or Swallow Maneuvers: Seated upright 90 degrees                Oral Care Recommendations: Oral care BID Follow up Recommendations: Skilled Nursing facility SLP Visit Diagnosis: Dysphagia, unspecified (R13.10) Plan: Continue with current plan of care       GO                Colleen Davis 08/24/2016, 8:45 AM   Colleen Davis Colleen Davis.Ed [employer]  [phone]

## 2016-08-24 NOTE — Progress Notes (Signed)
[facility] Progress Note  Patient: Colleen Davis VHQ:[id number]   PCP: Alroy Dust, L.Marlou Sa, MD DOB: [birth year]   DOA: 08/17/2016   DOS: 08/24/2016   Date of Service: the patient was seen and examined on 08/24/2016  Subjective: Awake and oriented this morning, able to follow simple command. Complains about abdominal pain, no other acute complaint.  Brief hospital course: Pt. with PMH of HTN, chronic systolic CHF, protein calorie malnutrition; admitted on 08/17/2016, presented with complaint of confusion with hypokalemia with symptomatic cholelithiasis. Currently further plan is to monitor for improvement in mentation.  Assessment and Plan: 1. Acute encephalopathy, metabolic versus nutritional. Presented with confusion, had severe protein calorie malnutrition with significant weight loss recently and that has led to possible consideration of nutritional deficiency causing wernicke's encephalopathy. Also metabolic the setting of polypharmacy cannot be ruled out. Neurology was consulted, currently signed off, recommend no further workup. MRI brain-limited study but without any acute abnormality. TSH free T4 unremarkable, HIV RPR negative, U-13 and folic acid level stable. ESR ammonia level normal. No active infection. Started on B-12 and B1 replacement and shows some improvement today. Speech therapy has advance the diet to dysphagia type II diet, will monitor. We'll continue to monitor at present.  2. Symptomatic cholelithiasis. Nausea and vomiting with abdominal tenderness. Surgery has been following. Recent GI workup was notable for gastritis. CT abdomen shows fatty deposition in the cecum. Surgery currently applying laparoscopic cholecystectomy depending on medical stability. At present mentation wise patient is medically stable but at risk for postop delirium due to anesthesia.  3. systolic CHF. Unclear whether acute or chronic. Patient denies any chest pain or shortness of breath does  not appear volume overloaded. EF 30-35% with severe hypokinesis diffuse and akinesis of the apex. Stress-induced versus ischemic. We will consult cardiology for further input.  4. Essential hypertension. Hypokalemia. Daily monitoring of potassium. The pressure medication currently on hold.  5. Severe protein calorie malnutrition. Battery consulted. Following the patient. Monitor.  Diet: Dysphagia type II DVT Prophylaxis: subcutaneous Heparin  Advance goals of care discussion: Full code  Family Communication: NO family was present at bedside, at the time of interview Disposition:  Discharge to SNF, 08/26/2008 depending on cardiology and surgical evaluation..  Consultants: Neurology, cardiology, general surgery Procedures: Echocardiogram  Antibiotics: Anti-infectives    Start     Dose/Rate Route Frequency Ordered Stop   08/17/16 0641  ceFAZolin (ANCEF) IVPB 2g/100 mL premix  Status:  Discontinued     2 g 200 mL/hr over 30 Minutes Intravenous On call to O.R. 08/17/16 0641 08/17/16 0829       Objective: Physical Exam: Vitals:   08/23/16 1434 08/23/16 2126 08/24/16 0504 08/24/16 1444  BP: (!) 146/93 137/77 119/71 116/74  Pulse: 85 71 68 68  Resp: 17  16 16   Temp: 98.2 F (36.8 C) 98.2 F (36.8 C) 98.7 F (37.1 C) 97.4 F (36.3 C)  TempSrc:  Oral Oral Oral  SpO2: 100% 100% 100% 100%  Weight:      Height:        Intake/Output Summary (Last 24 hours) at 08/24/16 1724 Last data filed at 08/24/16 1000  Gross per 24 hour  Intake              100 ml  Output              300 ml  Net             -200 ml   [employer]   08/17/16  0627 08/17/16 0652 08/22/16 0529  Weight: 40.8 kg (90 lb) 40.8 kg (90 lb) 39.9 kg (87 lb 15.4 oz)   General: Alert, Awake and Oriented to Place and Person. Appear in moderate distress, affect appropriate Eyes: PERRL, Conjunctiva normal ENT: Oral Mucosa clear moist. Neck: no JVD, no Abnormal Mass Or lumps Cardiovascular: S1 and S2  Present, aortic systolic Murmur, Respiratory: Bilateral Air entry equal and Decreased, no use of accessory muscle, Clear to Auscultation, no Crackles, no wheezes Abdomen: Bowel Sound present, Soft and no tenderness Skin: no redness, no Rash, no induration Extremities: no Pedal edema, no calf tenderness Neurologic: Grossly no focal neuro deficit. Bilaterally Equal motor strength  Data Reviewed: CBC:  Recent Labs Lab 08/18/16 0310 08/19/16 1649 08/20/16 0529 08/21/16 0839 08/22/16 0658 08/23/16 0611  WBC 7.2  --  8.1 7.5 6.0 4.8  NEUTROABS 5.8  --   --   --   --   --   HGB 11.6*  --  12.1 10.7* 10.7* 10.9*  HCT 34.7* 37.8 36.0 31.6* 30.9* 31.3*  MCV 90.1  --  90.0 90.8 90.6 89.9  PLT 206  --  167 164 141* 354   Basic Metabolic Panel:  Recent Labs Lab 08/18/16 0310 08/19/16 0800 08/20/16 0520 08/21/16 0839 08/22/16 0658 08/23/16 0611 08/24/16 0758  NA 136 134* 135 134* 136 139  --   K 2.4* 5.0 3.7 4.5 3.3* 3.1*  --   CL 96* 106 99* 103 104 105  --   CO2 25 20* 20* 18* 21* 22  --   GLUCOSE 134* 91 93 75 101* 94  --   BUN 11 7 7 10 6 7   --   CREATININE 0.70 0.65 0.71 0.80 0.69 0.76 0.89  CALCIUM 7.5* 7.7* 8.5* 8.6* 8.7* 8.5*  --   PHOS 5.4*  --   --   --   --   --   --     Liver Function Tests:  Recent Labs Lab 08/18/16 0310 08/22/16 0658  AST 21 19  ALT 12* 14  ALKPHOS 66 63  BILITOT 1.4* 1.0  PROT 5.5* 5.3*  ALBUMIN 3.3* 3.1*   No results for input(s): LIPASE, AMYLASE in the last 168 hours.  Recent Labs Lab 08/20/16 1928  AMMONIA 27   Coagulation Profile: No results for input(s): INR, PROTIME in the last 168 hours. Cardiac Enzymes: No results for input(s): CKTOTAL, CKMB, CKMBINDEX, TROPONINI in the last 168 hours. BNP (last 3 results) No results for input(s): PROBNP in the last 8760 hours. CBG:  Recent Labs Lab 08/21/16 2136 08/22/16 0759  GLUCAP 96 99   Studies: No results found.  Scheduled Meds: . carvedilol  3.125 mg Oral BID WC  .  cyanocobalamin  1,000 mcg Intramuscular Daily  . enoxaparin (LOVENOX) injection  30 mg Subcutaneous Daily  . feeding supplement  1 Container Oral BID BM  . feeding supplement (ENSURE ENLIVE)  237 mL Oral BID BM  . [START ON 6/56/8127] folic acid  1 mg Oral Daily  . magic mouthwash  10 mL Oral QID  . pantoprazole (PROTONIX) IV  40 mg Intravenous QHS  . potassium chloride  40 mEq Oral BID   Continuous Infusions: PRN Meds: alum & mag hydroxide-simeth, ondansetron (ZOFRAN) IV **OR** [DISCONTINUED] promethazine, ondansetron **OR** [DISCONTINUED] ondansetron (ZOFRAN) IV  Time spent: 30 minutes  Author: Berle Mull, MD [facility] Pager: [phone] 08/24/2016 5:24 PM  If 7PM-7AM, please contact night-coverage at [email or web address], password [facility]

## 2016-08-24 NOTE — Progress Notes (Signed)
[facility] Progress Note  7 Days Post-Op  Subjective: CC: encephalopathy Somnolent this morning but arousable and answering questions more appropriately. Denies pain but is more tender on exam today.   Objective: Vital signs in last 24 hours: Temp:  [98.2 F (36.8 C)-98.7 F (37.1 C)] 98.7 F (37.1 C) (05/09 0504) Pulse Rate:  [68-85] 68 (05/09 0504) Resp:  [16-17] 16 (05/09 0504) BP: (119-146)/(71-93) 119/71 (05/09 0504) SpO2:  [100 %] 100 % (05/09 0504) Last BM Date:  (PTA)  Intake/Output from previous day: 05/08 0701 - 05/09 0700 In: -  Out: 300 [Urine:300] Intake/Output this shift: No intake/output data recorded.  PE: Gen: cooperative, no acute distress Eyes: PERRL, no scleral icterus  Card: Regular rate and rhythm, no m/r/g Pulm: Normal effort, clear to auscultation bilaterally Abd: Soft, mild TTP RUQ without rebound tenderness or guarding, non-distended Skin: warm and dry, no rashes  MSK: muscle wasting in bilateral upper and lower extremities Neuro: somnolent, following commands Psych: oriented to person, not place or time   Lab Results:   Recent Labs  08/22/16 0658 08/23/16 0611  WBC 6.0 4.8  HGB 10.7* 10.9*  HCT 30.9* 31.3*  PLT 141* 196   BMET  Recent Labs  08/22/16 0658 08/23/16 0611  NA 136 139  K 3.3* 3.1*  CL 104 105  CO2 21* 22  GLUCOSE 101* 94  BUN 6 7  CREATININE 0.69 0.76  CALCIUM 8.7* 8.5*   CMP     Component Value Date/Time   NA 139 08/23/2016 0611   K 3.1 (L) 08/23/2016 0611   CL 105 08/23/2016 0611   CO2 22 08/23/2016 0611   GLUCOSE 94 08/23/2016 0611   BUN 7 08/23/2016 0611   CREATININE 0.76 08/23/2016 0611   CALCIUM 8.5 (L) 08/23/2016 0611   PROT 5.3 (L) 08/22/2016 0658   ALBUMIN 3.1 (L) 08/22/2016 0658   AST 19 08/22/2016 0658   ALT 14 08/22/2016 0658   ALKPHOS 63 08/22/2016 0658   BILITOT 1.0 08/22/2016 0658   GFRNONAA >60 08/23/2016 0611   GFRAA >60 08/23/2016 0611   Lipase     Component  Value Date/Time   LIPASE 30 08/13/2016 0813   Anti-infectives: Anti-infectives    Start     Dose/Rate Route Frequency Ordered Stop   08/17/16 0641  ceFAZolin (ANCEF) IVPB 2g/100 mL premix  Status:  Discontinued     2 g 200 mL/hr over 30 Minutes Intravenous On call to O.R. 08/17/16 1216 08/17/16 0829     Assessment/Plan  scheduled for lap chole 08/17/16 but cancelled 2/2 hypokalemia  - LFT's WNL, no leukocytosis  - will continue to follow for timing of laparoscopic cholecystectomy  Nausea/vomiting - suspect 2/2 above   Encephalopathy/confusion - continued workup per primary team. Neuro following. Re-attempt MRI brain as pt tolerates. Minimize CNS active meds, sleep hygiene.  HTN HLD  FEN - full liquid diet, hypokalemia, protein-calorie malnutrition ID - none VTE - lovenox, SCD's    LOS: 7 days    Adam Phenix , [facility] 08/24/2016, 7:20 AM Pager: [phone] Consults: [phone] Mon-Fri 7:00 am-4:30 pm Sat-Sun 7:00 am-11:30 am

## 2016-08-25 LAB — BASIC METABOLIC PANEL
Anion gap: 9 (ref 5–15)
BUN: 13 mg/dL (ref 6–20)
CHLORIDE: 108 mmol/L (ref 101–111)
CO2: 23 mmol/L (ref 22–32)
Calcium: 8.6 mg/dL — ABNORMAL LOW (ref 8.9–10.3)
Creatinine, Ser: 0.84 mg/dL (ref 0.44–1.00)
GFR calc Af Amer: >60 mL/min (ref >60)
GLUCOSE: 134 mg/dL — AB (ref 65–99)
POTASSIUM: 4.1 mmol/L (ref 3.5–5.1)
SODIUM: 140 mmol/L (ref 135–145)

## 2016-08-25 LAB — HEPATIC FUNCTION PANEL
ALK PHOS: 72 U/L (ref 38–126)
ALT: 15 U/L (ref 14–54)
AST: 22 U/L (ref 15–41)
Albumin: 3 g/dL — ABNORMAL LOW (ref 3.5–5.0)
Bilirubin, Direct: 0.1 mg/dL (ref 0.1–0.5)
Indirect Bilirubin: 0.3 mg/dL (ref 0.3–0.9)
Total Bilirubin: 0.4 mg/dL (ref 0.3–1.2)
Total Protein: 5.2 g/dL — ABNORMAL LOW (ref 6.5–8.1)

## 2016-08-25 LAB — MAGNESIUM: Magnesium: 1.5 mg/dL — ABNORMAL LOW (ref 1.7–2.4)

## 2016-08-25 MED ORDER — VITAMIN B-1 100 MG PO TABS: 100.0000 mg | ORAL_TABLET | Freq: Every day | ORAL | 0 refills | Status: DC

## 2016-08-25 MED ORDER — CYANOCOBALAMIN 1000 MCG/ML IJ SOLN: 1000.0000 ug | INTRAMUSCULAR | 0 refills | Status: DC

## 2016-08-25 MED ORDER — ENSURE ENLIVE PO LIQD: 237.0000 mL | Freq: Two times a day (BID) | ORAL | 12 refills | Status: DC

## 2016-08-25 MED ORDER — SENNOSIDES-DOCUSATE SODIUM 8.6-50 MG PO TABS
1.0000 | ORAL_TABLET | Freq: Two times a day (BID) | ORAL | Status: DC
  Administered 2016-08-25: 1 via ORAL
  Filled 2016-08-25: qty 1

## 2016-08-25 MED ORDER — FOLIC ACID 1 MG PO TABS: 1.0000 mg | ORAL_TABLET | Freq: Every day | ORAL | 0 refills | Status: DC

## 2016-08-25 MED ORDER — BOOST / RESOURCE BREEZE PO LIQD: 1.0000 | Freq: Two times a day (BID) | ORAL | 0 refills | Status: DC

## 2016-08-25 MED ORDER — ASPIRIN EC 81 MG PO TBEC: 81.0000 mg | DELAYED_RELEASE_TABLET | Freq: Every day | ORAL | 0 refills | Status: DC

## 2016-08-25 MED ORDER — SENNOSIDES-DOCUSATE SODIUM 8.6-50 MG PO TABS: 1.0000 | ORAL_TABLET | Freq: Two times a day (BID) | ORAL | 0 refills | Status: DC

## 2016-08-25 MED ORDER — MAGNESIUM HYDROXIDE 400 MG/5ML PO SUSP
15.0000 mL | Freq: Every day | ORAL | Status: DC
  Filled 2016-08-25: qty 30

## 2016-08-25 MED ORDER — POLYETHYLENE GLYCOL 3350 17 G PO PACK
17.0000 g | PACK | Freq: Every day | ORAL | Status: DC
  Filled 2016-08-25: qty 1

## 2016-08-25 MED ORDER — MAGNESIUM HYDROXIDE 400 MG/5ML PO SUSP: 15.0000 mL | Freq: Every day | ORAL | 0 refills | Status: DC | PRN

## 2016-08-25 MED ORDER — POLYETHYLENE GLYCOL 3350 17 G PO PACK: 17.0000 g | PACK | Freq: Every day | ORAL | 0 refills | Status: DC

## 2016-08-25 MED ORDER — LOSARTAN POTASSIUM 50 MG PO TABS
25.0000 mg | ORAL_TABLET | Freq: Every day | ORAL | Status: DC
  Administered 2016-08-25: 25 mg via ORAL
  Filled 2016-08-25: qty 1

## 2016-08-25 MED ORDER — MAGNESIUM SULFATE 2 GM/50ML IV SOLN
2.0000 g | Freq: Once | INTRAVENOUS | Status: AC
  Administered 2016-08-25: 2 g via INTRAVENOUS
  Filled 2016-08-25 (×2): qty 50

## 2016-08-25 MED ORDER — CARVEDILOL 3.125 MG PO TABS: 3.1250 mg | ORAL_TABLET | Freq: Two times a day (BID) | ORAL | 0 refills | Status: DC

## 2016-08-25 MED ORDER — LOSARTAN POTASSIUM 25 MG PO TABS: 25.0000 mg | ORAL_TABLET | Freq: Every day | ORAL | 0 refills | Status: DC

## 2016-08-25 NOTE — Progress Notes (Signed)
Physical Therapy Treatment Patient Details Name: Colleen Davis MRN: [id number] DOB: Nov 22, 1945 Today's Date: 08/25/2016    History of Present Illness 71 year old female who presents with a one-month history of persistent nausea and vomiting as well as weight loss. PMHx: Anxiety, CKD, Depression, Hyperipidemia, HTN.    PT Comments    Pt listless and needed lot of encouragement to participate.  Pt appears to be failing to thrive, not eating and definitely declining in stamina.  She did participate and improved in her gait distance and quality.    Follow Up Recommendations  SNF;Supervision/Assistance - 24 hour     Equipment Recommendations  Rolling walker with 5" wheels    Recommendations for Other Services       Precautions / Restrictions Precautions Precautions: Fall Precaution Comments: chair/bed alarm, video room, up with assist only    Mobility  Bed Mobility Overal bed mobility: Needs Assistance Bed Mobility: Supine to Sit     Supine to sit: Min assist     General bed mobility comments: assist in coming up and minimal help scooting to EOB  Transfers Overall transfer level: Needs assistance Equipment used: Rolling walker (2 wheeled) Transfers: Sit to/from Stand Sit to Stand: Min assist         General transfer comment: cues for hand placement,  stability assist while pt boosted  Ambulation/Gait Ambulation/Gait assistance: Min assist Ambulation Distance (Feet): 150 Feet Assistive device: Rolling walker (2 wheeled) Gait Pattern/deviations: Step-through pattern Gait velocity: slower Gait velocity interpretation: Below normal speed for age/gender General Gait Details: more stability today.  No overt ataxia, but some incoordination advancing LE's,.  RW consistently drifting Right needing assist to pull back into straight.  Pt stopped for rest x3 in the 150 foot span.   Stairs            Wheelchair Mobility    Modified Rankin (Stroke Patients Only)        Balance Overall balance assessment: Needs assistance Sitting-balance support: Feet supported Sitting balance-Leahy Scale: Fair     Standing balance support: Bilateral upper extremity supported   Standing balance comment: needs RW or external support for balance                            Cognition Arousal/Alertness: Lethargic Behavior During Therapy: Flat affect Overall Cognitive Status: Impaired/Different from baseline                   Orientation Level: Place Current Attention Level: Sustained   Following Commands: Follows one step commands inconsistently;Follows one step commands with increased time   Awareness: Intellectual Problem Solving: Slow processing        Exercises      General Comments General comments (skin integrity, edema, etc.): Tried unsuccessfully to get pt to eat something      Pertinent Vitals/Pain Pain Assessment: No/denies pain    Home Living                      Prior Function            PT Goals (current goals can now be found in the care plan section) Acute Rehab PT Goals Patient Stated Goal: To go home PT Goal Formulation: With family Time For Goal Achievement: 09/01/16 Potential to Achieve Goals: Fair Progress towards PT goals: Progressing toward goals    Frequency    Min 2X/week      PT Plan Current plan remains  appropriate    Co-evaluation              AM-PAC PT "6 Clicks" Daily Activity  Outcome Measure  Difficulty turning over in bed (including adjusting bedclothes, sheets and blankets)?: Total Difficulty moving from lying on back to sitting on the side of the bed? : Total Difficulty sitting down on and standing up from a chair with arms (e.g., wheelchair, bedside commode, etc,.)?: Total Help needed moving to and from a bed to chair (including a wheelchair)?: A Lot Help needed walking in hospital room?: A Little Help needed climbing 3-5 steps with a railing? : A Lot 6  Click Score: 10    End of Session   Activity Tolerance: Patient limited by lethargy Patient left: in chair;with call bell/phone within reach;with chair alarm set Nurse Communication: Mobility status PT Visit Diagnosis: Unsteadiness on feet (R26.81);Muscle weakness (generalized) (M62.81)     Time: 1405-1430 PT Time Calculation (min) (ACUTE ONLY): 25 min  Charges:  $Gait Training: 8-22 mins $Therapeutic Activity: 8-22 mins                    G Codes:       08/30/2016  [place] Bing, PT [phone]  (pager)   Eliseo Gum Torrin C[place]field 08/30/2016, 4:53 PM

## 2016-08-25 NOTE — Progress Notes (Signed)
Patient discharge instructions given to EMS.  Patient PIV's removed and patient awaiting transport.  Patient with AMS and unable to understand discharge instructions.    Danne Harbor RN 08/25/16 at [phone]

## 2016-08-25 NOTE — Care Management Important Message (Signed)
Important Message  Patient Details  Name: Colleen Davis MRN: [id number] Date of Birth: 13-May-1945   Medicare Important Message Given:  Yes    Donye Campanelli Abena 08/25/2016, 11:04 AM

## 2016-08-25 NOTE — Discharge Summary (Addendum)
[facility] Discharge Summary   Patient: Colleen Davis DZH:[id number]   PCP: Alroy Dust, L.Marlou Sa, MD DOB: [birth year]   Date of admission: 08/17/2016   Date of discharge:  08/25/2016    Discharge Diagnoses:  Principal Problem:   Hypokalemia Active Problems:   HTN (hypertension)   Hyperlipidemia   Symptomatic cholelithiasis   Abnormal urinalysis   Mild concentric left ventricular hypertrophy (LVH)   Severe protein-calorie malnutrition (HCC)   Visual hallucinations   Admitted From: home Disposition:  SNF  Recommendations for Outpatient Follow-up:  1. Please follow up with PCP in 1 week, surgery as recommended and cardiology in 1-2 week for pre-op clearance and further work up of cardiomyopathy.    Follow-up Information    Alroy Dust, L.Marlou Sa, MD. Schedule an appointment as soon as possible for a visit in 1 week(s).   Specialty:  Family Medicine Contact information: [address] [place] [address] [phone]        Donnie Mesa, MD. Schedule an appointment as soon as possible for a visit in 2 week(s).   Specialty:  General Surgery Contact information: [address] [place] [address] [phone]        Leonie Man, MD. Go on 08/26/2016.   Specialty:  Cardiology Why:  @ 2:10 pm for your appointment with Dr. Ellyn Hack for pre op clearance.  Contact information: [place] [address] [phone]          Diet recommendation: Dysphagia 3 (mechanical soft);Thin liquid Liquids provided via: Cup;Straw Medication Administration: Crushed with puree Supervision: Full supervision/cueing for compensatory strategies;Staff to assist with self feeding Compensations: Slow rate;Small sips/bites;Minimize environmental distractions;Monitor for anterior loss Postural Changes and/or Swallow Maneuvers: Seated upright 90 degrees  Activity: The patient is advised to gradually reintroduce usual  activities.  Discharge Condition: good  Code Status: full code  History of present illness: As per the H and P dictated on admission, "This is a 71 year old female who presents with a one-month history of persistent nausea and vomiting as well as weight loss. She has had significant constipation. She denies any diarrhea. Her symptoms seem to be exacerbated by eating. She is able to keep down liquids. Zofran does not seem to be effective in controlling her nausea. She was evaluated in the emergency department on 3/30 and had a CT scan of the abdomen and pelvis showing gallstones but no sign of acute cholecystitis. She also had some mild submucosal fatty deposition within the cecum and ascending colon. The etiology of this was unclear. The patient has never had a colonoscopy despite a family history of colon cancer in her sister. The patient continues to be fairly nauseated and continues to lose weight.   She underwent EGD and colonoscopy by Dr. Penelope Coop that were unremarkable.  She has been seen in the ED recently for weakness, dehydration, and hypokalemia.  Her surgery today is canceled for Potassium 2.4"  Hospital Course:  Summary of her active problems in the hospital is as following. 1. Acute encephalopathy, metabolic and nutritional. Presented with confusion, had severe protein calorie malnutrition with significant weight loss recently and that has led to consideration of nutritional deficiency causing wernicke's encephalopathy. Also metabolic the setting of polypharmacy Neurology was consulted, currently signed off, recommend no further workup. MRI brain-limited study but without any acute abnormality. TSH free T4 unremarkable, HIV RPR negative, Y-18 and folic acid level stable. ESR ammonia level normal. No active infection. Started on B-12 and B1 replacement  and shows improvement. Still not close to baseline but progressing.  Speech therapy has advance the diet to dysphagia type  III diet,  2. Symptomatic cholelithiasis. Nausea and vomiting with abdominal tenderness. Surgery primarily admitted to hospital, need lap chole. Recent GI workup was notable for gastritis. CT abdomen shows fatty deposition in the cecum. Surgery currently applying laparoscopic cholecystectomy depending on medical stability. At present mentation wise patient is medically stable but at risk for postop delirium due to anesthesia. preferably will hold surgery for at least 1 week as pt asymptomatic and LFT are stable.   3. systolic CHF. Unclear whether acute or chronic. Essential hypertension. Patient denies any chest pain or shortness of breath does not appear volume overloaded. EF 30-35% with severe hypokinesis diffuse and akinesis of the apex. Stress-induced versus ischemic. D/W cardiology attending, Pt will follow up outpatient for preop clearance and further work up of her cardiomyopathy.  Coreg and losartan added. As well as 81 mg aspirin.   4. Hypokalemia. Hypomagnesemia  Replaced Stable   5. Severe protein calorie malnutrition. dietary consulted. Continue supplement  All other chronic medical condition were stable during the hospitalization.  Patient was seen by physical therapy, who recommended SNF, which was arranged by Education officer, museum and case Freight forwarder. On the day of the discharge the patient's vitals were stable, and no other acute medical condition were reported by patient. the patient was felt safe to be discharge at SNF with therapy.  Procedures and Results:  Echocardiogram  Study Conclusions  - Left ventricle: LVEF is approximately 30 to 35% with severe   hypokinesis of the distal septal, distal inferior, mid/distal   anterior, mid/distal anterolateral walls and Akinesis of the   apex. The cavity size was normal. Wall thickness was normal. - Aortic valve: There was trivial regurgitation. - Mitral valve: There was mild regurgitation.   Consultations:  General  surgery   Neurology  DISCHARGE MEDICATION: Current Discharge Medication List    START taking these medications   Details  aspirin EC 81 MG tablet Take 1 tablet (81 mg total) by mouth daily. Qty: 30 tablet, Refills: 0    carvedilol (COREG) 3.125 MG tablet Take 1 tablet (3.125 mg total) by mouth 2 (two) times daily with a meal. Qty: 30 tablet, Refills: 0    cyanocobalamin (,VITAMIN B-12,) 1000 MCG/ML injection Inject 1 mL (1,000 mcg total) into the skin once a week. Qty: 1 mL, Refills: 0    !! feeding supplement (BOOST / RESOURCE BREEZE) LIQD Take 1 Container by mouth 2 (two) times daily between meals. Qty: 21 Container, Refills: 0    !! feeding supplement, ENSURE ENLIVE, (ENSURE ENLIVE) LIQD Take 237 mLs by mouth 2 (two) times daily between meals. Qty: 237 mL, Refills: 12    folic acid (FOLVITE) 1 MG tablet Take 1 tablet (1 mg total) by mouth daily. Qty: 30 tablet, Refills: 0    losartan (COZAAR) 25 MG tablet Take 1 tablet (25 mg total) by mouth daily. Qty: 30 tablet, Refills: 0    magnesium hydroxide (MILK OF MAGNESIA) 400 MG/5ML suspension Take 15 mLs by mouth daily as needed for mild constipation. Qty: 360 mL, Refills: 0    polyethylene glycol (MIRALAX / GLYCOLAX) packet Take 17 g by mouth daily. Qty: 14 each, Refills: 0    senna-docusate (SENOKOT-S) 8.6-50 MG tablet Take 1 tablet by mouth 2 (two) times daily. Qty: 4 tablet, Refills: 0    thiamine (VITAMIN B-1) 100 MG tablet Take 1 tablet (100 mg total)  by mouth daily. Qty: 30 tablet, Refills: 0     !! - Potential duplicate medications found. Please discuss with provider.    CONTINUE these medications which have NOT CHANGED   Details  ALPRAZolam (XANAX) 0.25 MG tablet Take 0.25 mg by mouth 3 (three) times daily as needed for anxiety.     atorvastatin (LIPITOR) 20 MG tablet Take 20 mg by mouth daily at 6 PM.    ondansetron (ZOFRAN) 4 MG tablet Take 1 tablet (4 mg total) by mouth every 6 (six) hours. Qty: 12 tablet,  Refills: 0    potassium chloride (K-DUR) 10 MEQ tablet Take 1 tablet (10 mEq total) by mouth daily. Qty: 30 tablet, Refills: 0      STOP taking these medications     lisinopril-hydrochlorothiazide (PRINZIDE,ZESTORETIC) 20-25 MG per tablet        Allergies  Allergen Reactions  . No Known Allergies    Discharge Instructions    DIET DYS 3    Complete by:  As directed    Fluid consistency:  Thin   Diet recommendations: Dysphagia 3 (mechanical soft);Thin liquid Liquids provided via: Cup;Straw Medication Administration: Crushed with puree Supervision: Full supervision/cueing for compensatory strategies;Staff to assist with self feeding Compensations: Slow rate;Small sips/bites;Minimize environmental distractions;Monitor for anterior loss Postural Changes and/or Swallow Maneuvers: Seated upright 90 degrees   Diet - low sodium heart healthy    Complete by:  As directed    Discharge instructions    Complete by:  As directed    Diet recommendations: Dysphagia 3 (mechanical soft);Thin liquid Liquids provided via: Cup;Straw Medication Administration: Crushed with puree Supervision: Full supervision/cueing for compensatory strategies;Staff to assist with self feeding Compensations: Slow rate;Small sips/bites;Minimize environmental distractions;Monitor for anterior loss Postural Changes and/or Swallow Maneuvers: Seated upright 90 degrees   Increase activity slowly    Complete by:  As directed      Discharge Exam: Filed Weights   08/17/16 0627 08/17/16 0652 08/22/16 0529  Weight: 40.8 kg (90 lb) 40.8 kg (90 lb) 39.9 kg (87 lb 15.4 oz)   Vitals:   08/25/16 0555 08/25/16 1009  BP: 113/60 110/60  Pulse: 73 75  Resp: 17   Temp: 98.2 F (36.8 C)    General: Appear in no distress, no Rash; Oral Mucosa moist. Cardiovascular: S1 and S2 Present, no Murmur, no JVD Respiratory: Bilateral Air entry present and Clear to Auscultation, no Crackles, no wheezes Abdomen: Bowel Sound present,  Soft and no tenderness Extremities: no Pedal edema, no calf tenderness Neurology: Grossly no focal neuro deficit.  The results of significant diagnostics from this hospitalization (including imaging, microbiology, ancillary and laboratory) are listed below for reference.    Significant Diagnostic Studies: Ct Head Wo Contrast  Result Date: 08/18/2016 CLINICAL DATA:  Patient is nonverbal.  Confusion and lethargy. EXAM: CT HEAD WITHOUT CONTRAST TECHNIQUE: Contiguous axial images were obtained from the base of the skull through the vertex without intravenous contrast. COMPARISON:  November 16, 2011 FINDINGS: Brain: No subdural, epidural, or subarachnoid hemorrhage. No mass, mass effect or midline shift. Ventricles and sulci are stable. Cerebellum, brainstem, and basal cisterns are normal. Mild white matter changes. No acute cortical ischemia or infarct. No other acute abnormalities. Vascular: No hyperdense vessel or unexpected calcification. Skull: Normal. Negative for fracture or focal lesion. Sinuses/Orbits: No acute finding. Other: None. IMPRESSION: No acute intracranial abnormality. Electronically Signed   By: Dorise Bullion III M.D   On: 08/18/2016 16:34   Mr Brain Wo Contrast  Result Date: 08/21/2016  CLINICAL DATA:  71 year old female With acute encephalopathy. Confusion and lethargy. Ocular dysmotility. EXAM: MRI HEAD WITHOUT CONTRAST TECHNIQUE: Multiplanar, multiecho pulse sequences of the brain and surrounding structures were obtained without intravenous contrast. COMPARISON:  Head CT without contrast 08/18/2016. Brain MRI 11/17/2011. FINDINGS: The examination had to be discontinued prior to completion due to patient agitation. Intermittently motion degraded diffusion imaging, sagittal T1 weighted imaging, and axial T2, FLAIR and T2* imaging was obtained. Brain: Cerebral volume is not significantly changed since 2013 and is within normal limits for age. No restricted diffusion identified. No midline  shift, mass effect, evidence of mass lesion, ventriculomegaly, extra-axial collection or acute intracranial hemorrhage. No abnormal T2 signal identified in the deep gray matter nuclei, midbrain, periaqueductal gray, brainstem or cerebellum. Scattered subcortical cerebral white matter T2 and FLAIR hyperintensity was better demonstrated in 2013 but the extent appears not significantly changed. No cortical encephalomalacia or chronic cerebral blood products are identified. Vascular: Major intracranial vascular flow voids appear stable since 2013. Skull and upper cervical spine: Grossly negative. Sinuses/Orbits: Stable and negative. Other: Mild left mastoid effusion.  Negative visible nasopharynx. IMPRESSION: No acute intracranial abnormality and no change from the 2013 brain MRI identified on this intermittently motion degraded study which had to be discontinued prior to completion due to patient agitation despite medical anxiolysis. Electronically Signed   By: Genevie Ann M.D.   On: 08/21/2016 12:19   Dg Abd Acute W/chest  Result Date: 08/13/2016 CLINICAL DATA:  Abdominal pain EXAM: DG ABDOMEN ACUTE W/ 1V CHEST COMPARISON:  None. FINDINGS: There is no evidence of dilated bowel loops or free intraperitoneal air. No radiopaque calculi or other significant radiographic abnormality is seen. Heart size and mediastinal contours are within normal limits. Both lungs are clear. IMPRESSION: Negative abdominal radiographs.  No acute cardiopulmonary disease. Electronically Signed   By: Kathreen Devoid   On: 08/13/2016 10:07    Microbiology: Recent Results (from the past 240 hour(s))  Culture, Urine     Status: Abnormal   Collection Time: 08/17/16  8:30 AM  Result Value Ref Range Status   Specimen Description URINE, RANDOM  Final   Special Requests NONE  Final   Culture MULTIPLE SPECIES PRESENT, SUGGEST RECOLLECTION (A)  Final   Report Status 08/18/2016 FINAL  Final     Labs: CBC:  Recent Labs Lab 08/19/16 1649  08/20/16 0529 08/21/16 0839 08/22/16 0658 08/23/16 0611  WBC  --  8.1 7.5 6.0 4.8  HGB  --  12.1 10.7* 10.7* 10.9*  HCT 37.8 36.0 31.6* 30.9* 31.3*  MCV  --  90.0 90.8 90.6 89.9  PLT  --  167 164 141* 793   Basic Metabolic Panel:  Recent Labs Lab 08/20/16 0520 08/21/16 0839 08/22/16 0658 08/23/16 0611 08/24/16 0758 08/25/16 0523  NA 135 134* 136 139  --  140  K 3.7 4.5 3.3* 3.1*  --  4.1  CL 99* 103 104 105  --  108  CO2 20* 18* 21* 22  --  23  GLUCOSE 93 75 101* 94  --  134*  BUN _0 --  13  CREATININE 0.71 0.80 0.69 0.76 0.89 0.84  CALCIUM 8.5* 8.6* 8.7* 8.5*  --  8.6*  MG  --   --   --   --   --  1.5*   Liver Function Tests:  Recent Labs Lab 08/22/16 0658 08/25/16 0523  AST 19 22  ALT 14 15  ALKPHOS 63 72  BILITOT 1.0  0.4  PROT 5.3* 5.2*  ALBUMIN 3.1* 3.0*   No results for input(s): LIPASE, AMYLASE in the last 168 hours.  Recent Labs Lab 08/20/16 1928  AMMONIA 27   Cardiac Enzymes: No results for input(s): CKTOTAL, CKMB, CKMBINDEX, TROPONINI in the last 168 hours. BNP (last 3 results) No results for input(s): BNP in the last 8760 hours. CBG:  Recent Labs Lab 08/21/16 2136 08/22/16 0759  GLUCAP 96 99   Time spent: 35 minutes  Signed:  Aanchal Cope  [facility]  08/25/2016  , 2:12 PM

## 2016-08-25 NOTE — Progress Notes (Signed)
Patient will DC to: [facility] Anticipated DC date: 08/25/16 Family notified: Daughter-in-law, son, sister Transport by: Sharin Mons   Per MD patient ready for DC to [facility]. RN, patient, patient's family, and facility notified of DC. Discharge Summary sent to facility. RN given number for report [phone]). DC packet on chart. Ambulance transport requested for patient.   CSW signing off.  Cristobal Goldmann, [place] Clinical Social Worker [phone]

## 2016-08-25 NOTE — Clinical Social Work Placement (Signed)
   CLINICAL SOCIAL WORK PLACEMENT  NOTE  Date:  08/25/2016  Patient Details  Name: Colleen Davis MRN: [id number] Date of Birth: [birth year]  Clinical Social Work is seeking post-discharge placement for this patient at the Skilled  Nursing Facility level of care (*CSW will initial, date and re-position this form in  chart as items are completed):  Yes   Patient/family provided with [place] Clinical Social Work Department's list of facilities offering this level of care within the geographic area requested by the patient (or if unable, by the patient's family).  Yes   Patient/family informed of their freedom to choose among providers that offer the needed level of care, that participate in Medicare, Medicaid or managed care program needed by the patient, have an available bed and are willing to accept the patient.  Yes   Patient/family informed of [facility] ownership interest in [facility] and [facility] [place], as well as of the fact that they are under no obligation to receive care at these facilities.  PASRR submitted to EDS on 08/19/16     PASRR number received on 08/19/16     Existing PASRR number confirmed on       FL2 transmitted to all facilities in geographic area requested by pt/family on 08/19/16     FL2 transmitted to all facilities within larger geographic area on       Patient informed that his/her managed care company has contracts with or will negotiate with certain facilities, including the following:        Yes   Patient/family informed of bed offers received.  Patient chooses bed at [facility]     Physician recommends and patient chooses bed at      Patient to be transferred to [facility] on 08/25/16.  Patient to be transferred to facility by [facility]     Patient family notified on 08/25/16 of transfer.  Name of family member notified:  Daughter in law and sister and son     PHYSICIAN       Additional Comment:     _______________________________________________ Mearl Latin, LCSWA 08/25/2016, 2:53 PM

## 2016-08-25 NOTE — Progress Notes (Signed)
[facility] Progress Note  8 Days Post-Op  Subjective: CC: Encephalopathy Patient easily arouseable and answering questions. Somewhat disoriented. Denies any abdominal pain, N/V.   Objective: Vital signs in last 24 hours: Temp:  [97.4 F (36.3 C)-98.2 F (36.8 C)] 98.2 F (36.8 C) (05/10 0555) Pulse Rate:  [68-73] 73 (05/10 0555) Resp:  [16-17] 17 (05/10 0555) BP: (113-160)/(60-87) 113/60 (05/10 0555) SpO2:  [99 %-100 %] 99 % (05/10 0555) Last BM Date:  (PTA)  Intake/Output from previous day: 05/09 0701 - 05/10 0700 In: 100 [P.O.:100] Out: 550 [Urine:550] Intake/Output this shift: No intake/output data recorded.  PE: Gen:  Cooperative, NAD. Card:  Regular rate and rhythm, pedal pulses 2+ BL Pulm:  Normal effort, clear to auscultation bilaterally Abd: Soft, non-tender on palpation, non-distended. +BS in all quadrants.  Skin: warm and dry, no rashes  Psych: Oriented to person, not oriented to place or time.   Lab Results:   Recent Labs  08/23/16 0611  WBC 4.8  HGB 10.9*  HCT 31.3*  PLT 196   BMET  Recent Labs  08/23/16 0611 08/24/16 0758 08/25/16 0523  NA 139  --  140  K 3.1*  --  4.1  CL 105  --  108  CO2 22  --  23  GLUCOSE 94  --  134*  BUN 7  --  13  CREATININE 0.76 0.89 0.84  CALCIUM 8.5*  --  8.6*   CMP     Component Value Date/Time   NA 140 08/25/2016 0523   K 4.1 08/25/2016 0523   CL 108 08/25/2016 0523   CO2 23 08/25/2016 0523   GLUCOSE 134 (H) 08/25/2016 0523   BUN 13 08/25/2016 0523   CREATININE 0.84 08/25/2016 0523   CALCIUM 8.6 (L) 08/25/2016 0523   PROT 5.3 (L) 08/22/2016 0658   ALBUMIN 3.1 (L) 08/22/2016 0658   AST 19 08/22/2016 0658   ALT 14 08/22/2016 0658   ALKPHOS 63 08/22/2016 0658   BILITOT 1.0 08/22/2016 0658   GFRNONAA >60 08/25/2016 0523   GFRAA >60 08/25/2016 0523     Assessment/Plan scheduled for lap chole 08/17/16 but cancelled 2/2 hypokalemia  - I think it is reasonable at this point to delay  laparoscopic cholecystectomy at this time and have patient follow up with Dr. Harlon Flor when encephalopathy has resolved.    Encephalopathy/confusion - continued workup per primary team. Neuro following. Patient improving some with B12 and B1 supplementation.  HTN HLD  FEN - DYS 2 diet, hypokalemia improved, patient getting protein supplements  ID - none VTE - lovenox, SCD's    LOS: 8 days    Wells Guiles , [facility] 08/25/2016, 7:26 AM Pager: [phone] Consults: [phone] Mon-Fri 7:00 am-4:30 pm Sat-Sun 7:00 am-11:30 am

## 2016-08-25 NOTE — Progress Notes (Signed)
Colleen Davis to be D/C'd to Nursing Home per MD order.  Discussed with the patient and all questions fully answered.  VSS, Skin clean, dry and intact without evidence of skin break down, no evidence of skin tears noted. IV catheter discontinued intact. Site without signs and symptoms of complications. Dressing and pressure applied.  An After Visit Summary was printed and given to the patient. Patient received prescription.  D/c education completed with patient/family including follow up instructions, medication list, d/c activities limitations if indicated, with other d/c instructions as indicated by MD - patient able to verbalize understanding, all questions fully answered.   Patient instructed to return to ED, call 911, or call MD for any changes in condition.   Patient escorted via WC, and D/C home via private auto.  Evern Bio 08/25/2016 4:40 PM

## 2016-08-25 NOTE — Progress Notes (Signed)
  Speech Language Pathology Treatment: Dysphagia  Patient Details Name: Colleen Davis MRN: [id number] DOB: Dec 11, 1945 Today's Date: 08/25/2016 Time: 4580-9983 SLP Time Calculation (min) (ACUTE ONLY): 19 min  Assessment / Plan / Recommendation Clinical Impression  Pt continues to make great progress toward goals. SLP unaware that pt NPO for surgical team to assess and around 9:30 she ate approximately 1/2 pack graham cracker (approx 1 and 1/2 cracker), 3 bites magic cup, 5 sips boost/orange juice. Feeds herself needing set up assist and to see items on tray (uncertain etiology of visual disturbance). Functional mastication with cracker, no s/s aspiration. When pt able to return to po's, recommend upgraded texture of Dys 3, thin liquids.    HPI HPI: 71 year old female who presents with a one-month history of persistent nausea and vomiting as well as weight loss. Found to have gallstonnes. PMHx: Anxiety, CKD, Depression, Hyperipidemia, HTN      SLP Plan  Continue with current plan of care       Recommendations  Diet recommendations: Dysphagia 3 (mechanical soft);Thin liquid Liquids provided via: Cup;Straw Medication Administration: Crushed with puree Supervision: Full supervision/cueing for compensatory strategies;Staff to assist with self feeding Compensations: Slow rate;Small sips/bites;Minimize environmental distractions;Monitor for anterior loss Postural Changes and/or Swallow Maneuvers: Seated upright 90 degrees                Oral Care Recommendations: Oral care BID Follow up Recommendations: Skilled Nursing facility SLP Visit Diagnosis: Dysphagia, unspecified (R13.10) Plan: Continue with current plan of care       GO                Royce Macadamia 08/25/2016, 9:44 AM  Breck Coons Lonell Face.Ed [employer] [phone]

## 2016-08-26 ENCOUNTER — Ambulatory Visit: Payer: Medicare Other | Admitting: Cardiology

## 2016-08-26 LAB — VITAMIN B1: Vitamin B1 (Thiamine): 120 nmol/L (ref 66.5–200.0)

## 2016-09-06 ENCOUNTER — Emergency Department (HOSPITAL_COMMUNITY): Payer: Medicare Other

## 2016-09-06 ENCOUNTER — Emergency Department (HOSPITAL_COMMUNITY)
Admission: EM | Admit: 2016-09-06 | Discharge: 2016-09-06 | Disposition: A | Discharge status: Home / Self Care | Payer: Medicare Other | Attending: Emergency Medicine | Admitting: Emergency Medicine

## 2016-09-06 ENCOUNTER — Other Ambulatory Visit: Payer: Self-pay

## 2016-09-06 DIAGNOSIS — J45909 Unspecified asthma, uncomplicated: Secondary | ICD-10-CM | POA: Diagnosis not present

## 2016-09-06 DIAGNOSIS — R4182 Altered mental status, unspecified: Secondary | ICD-10-CM | POA: Diagnosis present

## 2016-09-06 DIAGNOSIS — Z7982 Long term (current) use of aspirin: Secondary | ICD-10-CM | POA: Diagnosis not present

## 2016-09-06 DIAGNOSIS — R404 Transient alteration of awareness: Secondary | ICD-10-CM | POA: Insufficient documentation

## 2016-09-06 DIAGNOSIS — E860 Dehydration: Secondary | ICD-10-CM

## 2016-09-06 DIAGNOSIS — I129 Hypertensive chronic kidney disease with stage 1 through stage 4 chronic kidney disease, or unspecified chronic kidney disease: Secondary | ICD-10-CM | POA: Diagnosis not present

## 2016-09-06 DIAGNOSIS — N189 Chronic kidney disease, unspecified: Secondary | ICD-10-CM | POA: Diagnosis not present

## 2016-09-06 DIAGNOSIS — Z79899 Other long term (current) drug therapy: Secondary | ICD-10-CM | POA: Diagnosis not present

## 2016-09-06 LAB — COMPREHENSIVE METABOLIC PANEL
ALBUMIN: 3.3 g/dL — AB (ref 3.5–5.0)
ALK PHOS: 102 U/L (ref 38–126)
ALT: 17 U/L (ref 14–54)
ANION GAP: 10 (ref 5–15)
AST: 34 U/L (ref 15–41)
BILIRUBIN TOTAL: 1.8 mg/dL — AB (ref 0.3–1.2)
BUN: 27 mg/dL — AB (ref 6–20)
CALCIUM: 9.3 mg/dL (ref 8.9–10.3)
CO2: 26 mmol/L (ref 22–32)
CREATININE: 0.91 mg/dL (ref 0.44–1.00)
Chloride: 107 mmol/L (ref 101–111)
GFR calc Af Amer: >60 mL/min (ref >60)
GFR calc non Af Amer: >60 mL/min (ref >60)
GLUCOSE: 125 mg/dL — AB (ref 65–99)
Potassium: 5.2 mmol/L — ABNORMAL HIGH (ref 3.5–5.1)
Sodium: 143 mmol/L (ref 135–145)
TOTAL PROTEIN: 6.1 g/dL — AB (ref 6.5–8.1)

## 2016-09-06 LAB — CBC WITH DIFFERENTIAL/PLATELET
BASOS PCT: 1 %
Basophils Absolute: 0.1 10*3/uL (ref 0.0–0.1)
EOS ABS: 0.1 10*3/uL (ref 0.0–0.7)
Eosinophils Relative: 1 %
HCT: 40 % (ref 36.0–46.0)
Hemoglobin: 12.8 g/dL (ref 12.0–15.0)
Lymphocytes Relative: 24 %
Lymphs Abs: 1.2 10*3/uL (ref 0.7–4.0)
MCH: 31.1 pg (ref 26.0–34.0)
MCHC: 32 g/dL (ref 30.0–36.0)
MCV: 97.3 fL (ref 78.0–100.0)
MONO ABS: 0.4 10*3/uL (ref 0.1–1.0)
Monocytes Relative: 8 %
Neutro Abs: 3.4 10*3/uL (ref 1.7–7.7)
Neutrophils Relative %: 66 %
PLATELETS: 277 10*3/uL (ref 150–400)
RBC: 4.11 MIL/uL (ref 3.87–5.11)
RDW: 15 % (ref 11.5–15.5)
WBC: 5.1 10*3/uL (ref 4.0–10.5)

## 2016-09-06 LAB — URINALYSIS, ROUTINE W REFLEX MICROSCOPIC
BACTERIA UA: NONE SEEN
BILIRUBIN URINE: NEGATIVE
Glucose, UA: NEGATIVE mg/dL
Hgb urine dipstick: NEGATIVE
Ketones, ur: 5 mg/dL — AB
Leukocytes, UA: NEGATIVE
NITRITE: NEGATIVE
PROTEIN: 30 mg/dL — AB
Specific Gravity, Urine: 1.021 (ref 1.005–1.030)
Squamous Epithelial / LPF: NONE SEEN
pH: 5 (ref 5.0–8.0)

## 2016-09-06 LAB — AMMONIA: AMMONIA: 47 umol/L — AB (ref 9–35)

## 2016-09-06 LAB — PROTIME-INR
INR: 0.98
Prothrombin Time: 13 seconds (ref 11.4–15.2)

## 2016-09-06 LAB — APTT: aPTT: 28 seconds (ref 24–36)

## 2016-09-06 MED ORDER — SODIUM CHLORIDE 0.9 % IV BOLUS (SEPSIS)
1000.0000 mL | Freq: Once | INTRAVENOUS | Status: AC
  Administered 2016-09-06: 1000 mL via INTRAVENOUS

## 2016-09-06 MED ORDER — SODIUM CHLORIDE 0.9 % IV SOLN
INTRAVENOUS | Status: DC
  Administered 2016-09-06: 11:00:00 via INTRAVENOUS

## 2016-09-06 NOTE — ED Triage Notes (Signed)
Per EMS pt from [facility] at baseline is alert and oriented x4 and ambulatory. Last night at 9pm she was a her baseline with no complaints. Staff came in this morning this morning and was unable to awaken patient. Breathing unlabored- lungs clear throughout. Patient unarousable with verbal stimuli. Pupils were equal and reactive. EMS attempt to insert NPA patient started coughing so did not proceed. Patient unresponsive to pain in both hands for EMS.
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
## 2016-09-06 NOTE — Discharge Instructions (Signed)
Continue to drink plenty of fluids, follow up with your doctors as planned

## 2016-09-06 NOTE — ED Notes (Signed)
Pt to CT

## 2016-09-06 NOTE — ED Provider Notes (Signed)
[facility]-EMERGENCY DEPT Provider Note   CSN: [id number] Arrival date & time: 09/06/16  4098     History   Chief Complaint Chief Complaint  Patient presents with  . Altered Mental Status    HPI Colleen Davis is a 71 y.o. female.  HPI Patient presents to the emergency room for evaluation of altered mental status. The patient's had a rather complex recent medical history. She's was recently admitted to the hospital on May 2. She was discharged with diagnoses including severe protein calorie malnutrition, visual hallucinations, symptomatic cholelithiasis, and hypokalemia. Patient is still scheduled for possible outpatient cholecystectomy. The patient is a resident of a nursing home. According to the EMS report the patient was found unresponsive this morning. Usually she is up and walking and talkative. In the emergency room, initially the patient would not answer any questions or respond to any commands. However during my exam she was eventually responding. Patient does not want to speak to me. She will not her head yes and no and nods her head no when I ask her if she will speak to me. It is difficult to get any other history from her Past Medical History:  Diagnosis Date  . Anxiety   . Chronic kidney disease    polycystic  . Dehydration    recently hosp  . Depression   . History of kidney stones    cyst on kidney  . Hyperlipidemia   . Hypertension   . Hypokalemia     Patient Active Problem List   Diagnosis Date Noted  . Hypokalemia 08/17/2016  . Symptomatic cholelithiasis 08/17/2016  . Abnormal urinalysis 08/17/2016  . Mild concentric left ventricular hypertrophy (LVH) 08/17/2016  . Severe protein-calorie malnutrition (HCC) 08/17/2016  . Visual hallucinations 08/17/2016  . Syncope and collapse 11/17/2011  . HTN (hypertension) 11/17/2011  . Tinnitus of right ear 11/17/2011  . Hyperlipidemia 11/17/2011    Past Surgical History:  Procedure Laterality Date  . DILATION AND  CURETTAGE OF UTERUS    . TUBAL LIGATION      OB History    No data available       Home Medications    Prior to Admission medications   Medication Sig Start Date End Date Taking? Authorizing Provider  ALPRAZolam (XANAX) 0.25 MG tablet Take 0.25 mg by mouth 3 (three) times daily as needed for anxiety.    Yes [provider]  aspirin EC 81 MG tablet Take 1 tablet (81 mg total) by mouth daily. 08/25/16  Yes Rolly Salter, MD  atorvastatin (LIPITOR) 20 MG tablet Take 20 mg by mouth daily at 6 PM.   Yes [provider]  carvedilol (COREG) 3.125 MG tablet Take 1 tablet (3.125 mg total) by mouth 2 (two) times daily with a meal. 08/25/16  Yes Rolly Salter, MD  cyanocobalamin (,VITAMIN B-12,) 1000 MCG/ML injection Inject 1 mL (1,000 mcg total) into the skin once a week. 08/25/16  Yes Rolly Salter, MD  feeding supplement (BOOST / RESOURCE BREEZE) LIQD Take 1 Container by mouth 2 (two) times daily between meals. 08/25/16  Yes Rolly Salter, MD  feeding supplement, ENSURE ENLIVE, (ENSURE ENLIVE) LIQD Take 237 mLs by mouth 2 (two) times daily between meals. 08/25/16  Yes Rolly Salter, MD  folic acid (FOLVITE) 1 MG tablet Take 1 tablet (1 mg total) by mouth daily. 08/25/16  Yes Rolly Salter, MD  loperamide (IMODIUM A-D) 2 MG tablet Take 4 mg by mouth every 6 (six) hours  as needed for diarrhea or loose stools.   Yes [provider]  losartan (COZAAR) 25 MG tablet Take 1 tablet (25 mg total) by mouth daily. 08/26/16  Yes Rolly Salter, MD  magnesium hydroxide (MILK OF MAGNESIA) 400 MG/5ML suspension Take 15 mLs by mouth daily as needed for mild constipation. 08/25/16  Yes Rolly Salter, MD  Multiple Vitamins-Minerals (DECUBI-VITE PO) Take 1 capsule by mouth daily.   Yes [provider]  ondansetron (ZOFRAN) 4 MG tablet Take 1 tablet (4 mg total) by mouth every 6 (six) hours. Patient taking differently: Take 4 mg by mouth every 6 (six) hours as needed for  nausea.  08/06/16  Yes Petit, Janyth Pupa, MD  polyethylene glycol (MIRALAX / GLYCOLAX) packet Take 17 g by mouth daily. 08/25/16  Yes Rolly Salter, MD  potassium chloride (K-DUR) 10 MEQ tablet Take 1 tablet (10 mEq total) by mouth daily. 06/22/16  Yes Audry Pili, PA-C  senna-docusate (SENOKOT-S) 8.6-50 MG tablet Take 1 tablet by mouth 2 (two) times daily. 08/25/16  Yes Rolly Salter, MD  thiamine (VITAMIN B-1) 100 MG tablet Take 1 tablet (100 mg total) by mouth daily. 08/25/16  Yes Rolly Salter, MD    Family History No family history on file.  Social History Social History  Substance Use Topics  . Smoking status: Never Smoker  . Smokeless tobacco: Never Used  . Alcohol use No     Allergies   No known allergies   Review of Systems Review of Systems  All other systems reviewed and are negative.    Physical Exam Updated Vital Signs BP (!) 119/57   Pulse 61   Temp 97.5 F (36.4 C) (Axillary)   Resp 17   Ht 1.676 m (5\' 6" )   Wt 39.5 kg (87 lb)   SpO2 100%   BMI 14.04 kg/m   Physical Exam  Constitutional: She appears listless. No distress.  Underweight  HENT:  Head: Normocephalic and atraumatic.  Right Ear: External ear normal.  Left Ear: External ear normal.  Mouth/Throat: No oropharyngeal exudate.  Mucous membranes are dry  Eyes: Conjunctivae are normal. Right eye exhibits no discharge. Left eye exhibits no discharge. No scleral icterus.  Patient resists me when I initially tried to open up her eyelids,  Neck: Neck supple. No tracheal deviation present.  Cardiovascular: Normal rate, regular rhythm and intact distal pulses.   Pulmonary/Chest: Effort normal and breath sounds normal. No stridor. No respiratory distress. She has no wheezes. She has no rales.  Abdominal: Soft. Bowel sounds are normal. She exhibits no distension. There is no tenderness. There is no rebound and no guarding.  Musculoskeletal: She exhibits no edema or tenderness.  Neurological: She  appears listless. No cranial nerve deficit (no facial droop,  initially when I attempted to open her eyes patient would just stare up towards the ceiling) or sensory deficit. She exhibits normal muscle tone. She displays no seizure activity.  The patient will not her head yes and no. She will follow commands. She does not lift her legs off the bed but will hold both arms above her head.  She does open her eyes when asked. She will not speak.  Generalized weakness. Slow to respond to commands  Skin: Skin is warm and dry. No rash noted.  Psychiatric: She has a normal mood and affect.  Nursing note and vitals reviewed.    ED Treatments / Results  Labs (all labs ordered are listed, but only abnormal results  are displayed) Labs Reviewed  COMPREHENSIVE METABOLIC PANEL - Abnormal; Notable for the following:       Result Value   Potassium 5.2 (*)    Glucose, Bld 125 (*)    BUN 27 (*)    Total Protein 6.1 (*)    Albumin 3.3 (*)    Total Bilirubin 1.8 (*)    All other components within normal limits  URINALYSIS, ROUTINE W REFLEX MICROSCOPIC - Abnormal; Notable for the following:    Color, Urine AMBER (*)    Ketones, ur 5 (*)    Protein, ur 30 (*)    All other components within normal limits  AMMONIA - Abnormal; Notable for the following:    Ammonia 47 (*)    All other components within normal limits  APTT  CBC WITH DIFFERENTIAL/PLATELET  PROTIME-INR    EKG  EKG Interpretation  Date/Time:  Tuesday Sep 06 2016 09:21:47 EDT Ventricular Rate:  88 PR Interval:    QRS Duration: 99 QT Interval:  430 QTC Calculation: 521 R Axis:   75 Text Interpretation:  Sinus rhythm Ventricular premature complex Aberrant conduction of SV complex(es) Repol abnrm, global ischemia, diffuse leads Prolonged QT interval Artifact No significant change since last tracing Confirmed by Linwood Dibbles [phone]) on 09/06/2016 9:36:44 AM       Radiology Ct Head Wo Contrast  Result Date: 09/06/2016 CLINICAL DATA:   Mental status change. EXAM: CT HEAD WITHOUT CONTRAST TECHNIQUE: Contiguous axial images were obtained from the base of the skull through the vertex without intravenous contrast. COMPARISON:  Brain MRI 08/21/2016 FINDINGS: Brain: Stable age related cerebral atrophy, ventriculomegaly and periventricular white matter disease. No extra-axial fluid collections are identified. No CT findings for acute hemispheric infarction or intracranial hemorrhage. No mass lesions. The brainstem and cerebellum are normal. Vascular: No hyperdense vessel or unexpected calcification. Skull: No scalp lesions or fracture. Sinuses/Orbits: The paranasal sinuses and mastoid air cells are clear. Globes are intact. Other: No scalp lesions or hematoma. IMPRESSION: No acute intracranial findings.  The the mild chronic changes. Electronically Signed   By: Rudie Meyer M.D.   On: 09/06/2016 10:17   Dg Chest Port 1 View  Result Date: 09/06/2016 CLINICAL DATA:  71 year old female found unresponsive this morning. EXAM: PORTABLE CHEST 1 VIEW COMPARISON:  08/13/2016 and earlier. FINDINGS: Portable AP semi upright view at 1046 hours. Stable lung volumes with borderline to mild pulmonary hyperinflation. Mediastinal contours remain normal. Allowing for portable technique the lungs are clear. Skin fold artifact on the left. No pneumothorax or pleural effusion. Negative visible bowel gas pattern. No acute osseous abnormality identified. IMPRESSION: No acute cardiopulmonary abnormality. Electronically Signed   By: Odessa Fleming M.D.   On: 09/06/2016 11:11    Procedures Procedures (including critical care time)  Medications Ordered in ED Medications  sodium chloride 0.9 % bolus 1,000 mL (0 mLs Intravenous Stopped 09/06/16 1100)    And  0.9 %  sodium chloride infusion ( Intravenous New Bag/Given 09/06/16 1100)     Initial Impression / Assessment and Plan / ED Course  I have reviewed the triage vital signs and the nursing notes.  Pertinent labs &  imaging results that were available during my care of the patient were reviewed by me and considered in my medical decision making (see chart for details).  Clinical Course as of Sep 07 1203  Tue Sep 06, 2016  1148 Pt is awake, alert, talking and back to her usual self.  [JK]    Clinical Course  User Index [JK] Linwood Dibbles, MD    Patient presented to the emergency room with decreased alertness. Initially the patient would not speak to me but would not her head yes and no. I suspect there was primarily a behavioral component to this.  While in the ED, after her family arrived, the patient's behavior improved. She is now alert and awake and speaking to me. Her laboratory tests are reassuring. I doubt stroke, or TIA. I doubt acute encephalopathy. Patient is being cared for at a nursing facility. She has scheduled appointments to discuss treatment of her gallbladder issues. I think she is stable for discharge at this point.  Final Clinical Impressions(s) / ED Diagnoses   Final diagnoses:  Dehydration  Transient alteration of awareness    New Prescriptions New Prescriptions   No medications on file     Linwood Dibbles, MD 09/06/16 1205

## 2016-09-07 ENCOUNTER — Ambulatory Visit (INDEPENDENT_AMBULATORY_CARE_PROVIDER_SITE_OTHER): Payer: Medicare Other | Admitting: Cardiology

## 2016-09-07 ENCOUNTER — Encounter: Payer: Self-pay | Admitting: Cardiology

## 2016-09-07 VITALS — BP 92/60 | HR 75 | Ht 62.5 in | Wt 87.0 lb

## 2016-09-07 DIAGNOSIS — I420 Dilated cardiomyopathy: Secondary | ICD-10-CM

## 2016-09-07 DIAGNOSIS — I5181 Takotsubo syndrome: Secondary | ICD-10-CM | POA: Insufficient documentation

## 2016-09-07 DIAGNOSIS — E43 Unspecified severe protein-calorie malnutrition: Secondary | ICD-10-CM

## 2016-09-07 DIAGNOSIS — R9431 Abnormal electrocardiogram [ECG] [EKG]: Secondary | ICD-10-CM | POA: Diagnosis not present

## 2016-09-07 DIAGNOSIS — I952 Hypotension due to drugs: Secondary | ICD-10-CM | POA: Insufficient documentation

## 2016-09-07 DIAGNOSIS — Z01810 Encounter for preprocedural cardiovascular examination: Secondary | ICD-10-CM | POA: Diagnosis not present

## 2016-09-07 NOTE — Assessment & Plan Note (Signed)
Unable to tolerate low-dose carvedilol plus losartan.   Plan will be to DC losartan

## 2016-09-07 NOTE — Patient Instructions (Addendum)
Schedule at [address] Your physician has requested that you have a lexiscan myoview. For further information please visit [email or web address] Please follow instruction sheet, as given.  STILL NOT CLEARED FOR SURGERY UNTIL TEST IS COMPLETED AND NEXT OFFICE VISIT.  STOP LOSARTAN    Your physician recommends that you schedule a follow-up appointment in 4 WEEKS WITH DR HARDING.

## 2016-09-07 NOTE — Progress Notes (Signed)
PCP: Clovis Riley, L.August Saucer, MD  Clinic Note: Chief Complaint  Patient presents with  . New Evaluation    Reduced ejection fraction; preoperative evaluation    HPI: Colleen Davis is a 71 y.o. female who is being seen today for the evaluation of cardiomyopathy/preoperative risk evaluation at the request of Clovis Riley, L.August Saucer, MD & Dr. Kingsley Plan.. Prior to your most recent illness, her only major diagnoses were hypertension, hyperlipidemia, anxiety and hypokalemia.  Colleen Davis is a very unfortunate now extremely frail/cachectic woman with a protracted course of cholecystitis that began back in March for weakness nausea and vomiting. A CT scan showed submucosal fatty deposits in the descending colon with recommendation for colonoscopy as well as cholelithiasis with gallbladder distention. Colonoscopy cholecystectomy were recommended, but the cholecystectomy has been delayed until she is able to strength. Since March she has had multiple ER visits for pain, dehydration and symmetric cholecystitis etc.  Recent Hospitalizations: She was finally admitted on May 2 - noted persistent nausea and vomiting with weight loss along with significant constipation. Any symptoms exacerbated by eating therefore she was having a hard time keeping down anything besides liquids. She was initially scheduled for surgery: Planned laparoscopic cholecystectomy on that day. Since then it has been delayed due to her protein nutrition, hypotension and failure to thrive.--> Admitted for stabilization as she was notably encephalopathic with severe protein malnutrition. She was started on nutrition supplementation with B12 and B1 replacement. There is also concern of heart failure as an echocardiogram performed showed severely reduced EF of 30-35% with severe hypokinesis with akinesis of the apex. The consideration was severe stress-induced versus ischemic. She was not seen by cardiology in the hospital, they recommended  outpatient evaluation. She presents now for this evaluation.  Studies Personally Reviewed - (if available, images/films reviewed: From Epic Chart or Care Everywhere)  Echo 08/18/2016 - EF 30-35% with severe hypokinesis of the distal septal, distal inferior, mid/distal anterior, mid/distal anterolateral walls and Akinesis of the apex. The cavity size was normal. Wall thickness was normal. Trivial Aortic regurgitation. Mitral Mild MR   Interval History: Colleen Davis presents today for preoperative clearance with her reduced ejection fraction on echo. She does have a normal EKG. Thankfully she is not noticing any significant PND, orthopnea or edema. She really doesn't do much of any type of activity recently. She has not noted any significant chest tightness or pressure with rest or exertion. Occasionally her legs will ache and cramp up, but she is not having any anginal type symptoms. No rapid irregular heartbeats palpitations to suggest arrhythmia. Just occasional "skipping beats". She has felt profoundly weak, but has not had any syncope or near-syncope, no TIA or amaurosis fugax symptoms. But she does notice that she has significant orthostatic dizziness. She cannot seem to get her blood pressure above 100 mmHg systolic No melena, hematochezia, hematuria, or epstaxis. No claudication.  ROS: A comprehensive was performed. Review of Systems  Constitutional: Positive for malaise/fatigue and weight loss. Negative for fever.       Failure to thrive  Respiratory: Positive for shortness of breath. Negative for cough and wheezing.   Cardiovascular:       Per history of present illness  Gastrointestinal: Positive for nausea and vomiting. Negative for blood in stool, constipation and melena.       Still hard to keep things down. Very poor appetite. Still losing weight.  Genitourinary: Negative for hematuria.  Musculoskeletal: Positive for joint pain. Negative for falls.  Neurological: Positive for dizziness  and weakness.  Psychiatric/Behavioral: Positive for depression and memory loss. The patient is nervous/anxious.   All other systems reviewed and are negative.  I have reviewed and (if needed) personally updated the patient's problem list, medications, allergies, past medical and surgical history, social and family history.   Past Medical History:  Diagnosis Date  . Anxiety   . Cardiomyopathy (HCC) 08/2016   EF 35% with suggestion of anterior wall motion abnormality. Cannot exclude ischemic versus Takotsubo  . Chronic kidney disease    polycystic  . Dehydration    recently hosp  . Depression   . History of kidney stones    cyst on kidney  . Hyperlipidemia   . Hypertension   . Hypokalemia     Past Surgical History:  Procedure Laterality Date  . DILATION AND CURETTAGE OF UTERUS    . TRANSTHORACIC ECHOCARDIOGRAM  08/19/2011   EF 30-35% with severe hypokinesis of the distal septal, distal inferior, mid/distal anterior, mid/distal anterolateral walls and Akinesis of the apex. The cavity size was normal. Wall thickness was normal. Trivial Aortic regurgitation. Mitral Mild MR  . TUBAL LIGATION      Current Meds  Medication Sig  . ALPRAZolam (XANAX) 0.25 MG tablet Take 0.25 mg by mouth 3 (three) times daily as needed for anxiety.   Marland Kitchen aspirin EC 81 MG tablet Take 1 tablet (81 mg total) by mouth daily.  Marland Kitchen atorvastatin (LIPITOR) 20 MG tablet Take 20 mg by mouth daily at 6 PM.  . carvedilol (COREG) 3.125 MG tablet Take 1 tablet (3.125 mg total) by mouth 2 (two) times daily with a meal.  . cyanocobalamin (,VITAMIN B-12,) 1000 MCG/ML injection Inject 1 mL (1,000 mcg total) into the skin once a week.  . feeding supplement (BOOST / RESOURCE BREEZE) LIQD Take 1 Container by mouth 2 (two) times daily between meals.  . feeding supplement, ENSURE ENLIVE, (ENSURE ENLIVE) LIQD Take 237 mLs by mouth 2 (two) times daily between meals.  . folic acid (FOLVITE) 1 MG tablet Take 1 tablet (1 mg total) by  mouth daily.  Marland Kitchen losartan (COZAAR) 25 MG tablet Take 1 tablet (25 mg total) by mouth daily.  . magnesium hydroxide (MILK OF MAGNESIA) 400 MG/5ML suspension Take 15 mLs by mouth daily as needed for mild constipation.  . ondansetron (ZOFRAN) 4 MG tablet Take 1 tablet (4 mg total) by mouth every 6 (six) hours. (Patient taking differently: Take 4 mg by mouth every 6 (six) hours as needed for nausea. )  . polyethylene glycol (MIRALAX / GLYCOLAX) packet Take 17 g by mouth daily.  . potassium chloride (K-DUR) 10 MEQ tablet Take 1 tablet (10 mEq total) by mouth daily.  Marland Kitchen senna-docusate (SENOKOT-S) 8.6-50 MG tablet Take 1 tablet by mouth 2 (two) times daily.  Marland Kitchen thiamine (VITAMIN B-1) 100 MG tablet Take 1 tablet (100 mg total) by mouth daily.    Allergies  Allergen Reactions  . No Known Allergies     Social History   Social History  . Marital status: Divorced    Spouse name: N/A  . Number of children: N/A  . Years of education: N/A   Social History Main Topics  . Smoking status: Never Smoker  . Smokeless tobacco: Never Used  . Alcohol use No  . Drug use: No  . Sexual activity: Not Asked   Other Topics Concern  . None   Social History Narrative  . None    family history includes Colon cancer in her sister; Hypertension in  her sister; Lung cancer in her father; Other in her sister.  Wt Readings from Last 3 Encounters:  09/07/16 87 lb (39.5 kg)  09/06/16 87 lb (39.5 kg)  08/22/16 87 lb 15.4 oz (39.9 kg)    PHYSICAL EXAM BP 92/60 (BP Location: Right Arm, Patient Position: Sitting, Cuff Size: Small)   Pulse 75   Ht 5' 2.5" (1.588 m)   Wt 87 lb (39.5 kg)   BMI 15.66 kg/m  General appearance: Thin, frail woman who appears much older than her stated age. Thick appearing. She is in a wheelchair with minimal muscle mass. Temporal wasting. HEENT: [place]/AT, EOMI, MMM, anicteric sclera Neck: no adenopathy, no carotid bruit and no JVD Lungs: clear to auscultation bilaterally, normal  percussion bilaterally and non-labored Heart: regular rate and rhythm, S1 &S2 normal, no murmur, click, rub or gallop; hyperdynamic precordium, but nondisplaced PMI Abdomen: soft, non-tender; bowel sounds normal; no masses,  no organomegaly; no HJR Extremities: extremities normal, atraumatic, no cyanosis, or edema  Pulses: 2+ and symmetric -mostly diffusely diminished in the lower extremities Skin: thin /frail skin. No lesions or wounds. or  Neurologic: Mental status: Alert & oriented x 3 (poor memory / poor historian), thought content appropriate - somewhat slowed mentation; non-focal exam.  Pleasant mood & affect. Cranial nerves: normal (II-XII grossly intact)    Adult ECG Report  Rate: 75 ;  Rhythm: normal sinus rhythm; market ST and T-wave abnormality with significant T-wave inversions in anterolateral leads. Consider anterolateral ischemia. This is associated with prolonged QT. Otherwise normal axis intervals and durations.  Narrative Interpretation: No significant change from May 2, but this is notable change since March 2018   Other studies Reviewed: Additional studies/ records that were reviewed today include:  Recent Labs:   Lab Results  Component Value Date   CREATININE 0.91 09/06/2016   BUN 27 (H) 09/06/2016   NA 143 09/06/2016   K 5.2 (H) 09/06/2016   CL 107 09/06/2016   CO2 26 09/06/2016    ASSESSMENT / PLAN: Problem List Items Addressed This Visit    Abnormal electrocardiogram (ECG) (EKG) (Chronic)    With her anterior wall motion abnormality on echocardiogram, she also has markedly ST and T-wave abnormalities that would also suggest anterior ischemia. Very difficult to ignore these findings in the same thought process of preoperative risk assessment. If the plan will be for medical management, would probably not consider treating further.  We will check a Lexiscan Myoview to evaluate for ischemia.      Relevant Orders   EKG 12-Lead (Completed)   Myocardial  Perfusion Imaging   Dilated cardiomyopathy (HCC) - presumably Ischemic vs. Takotsubo (Chronic)    Not sure what the etiology for her cardiomyopathy is. Certainly has been a stressful situations was to be a Takotsubo/stress related cardiomyopathy based on wall motion changes. She does not have a lot of blood pressure in fact she is hypotensive so would not stop her losartan today and simply continue with low-dose carvedilol.  Think we are obligated in the setting of potential surgery to evaluate with Myoview stress test. She would certainly require Lexiscan. If low risk, the recommendation would be to plan surgery without further evaluation, however as intermediate to high risk, we would probably need to consider cardiac catheterization. The issue would be what we do find something on stress test that does lead to something found by catheterization. Would probably consider BMS stent versus Synergy stent with short-term hold of antiplatelet agent after 3 months.  Relevant Orders   EKG 12-Lead (Completed)   Myocardial Perfusion Imaging   Hypotension due to drugs (Chronic)    Unable to tolerate low-dose carvedilol plus losartan.   Plan will be to DC losartan      Preoperative cardiovascular examination - Primary    At present, she has a significantly reduced EF with regional wall motion abnormalities and an abnormal EKG all suggesting the possibility of ischemic heart disease. We need to fully assess with stress test (chosen Lexiscan Myoview since she cannot walk on treadmill.)  If low risk, would proceed to the OR as she seems euvolemic. However if there is any suggestion of intermediate to high risk, she will probably need invasive evaluation with cardiac catheterization.       Relevant Orders   EKG 12-Lead (Completed)   Myocardial Perfusion Imaging   Severe protein-calorie malnutrition (HCC)    Still a very poor surgical candidate. I will be very leery of invasive procedures on her as  well, most notably cardiac catheterization with possible PCI. The plan for the next few months it least is to continue to try to put on weight and strength.          Current medicines are reviewed at length with the patient today. (+/- concerns) n/a The following changes have been made: n/a  Patient Instructions  Schedule at [address] Your physician has requested that you have a lexiscan myoview. For further information please visit [email or web address] Please follow instruction sheet, as given.  STILL NOT CLEARED FOR SURGERY UNTIL TEST IS COMPLETED AND NEXT OFFICE VISIT.  STOP LOSARTAN    Your physician recommends that you schedule a follow-up appointment in 4 WEEKS WITH DR HARDING.      Studies Ordered:   Orders Placed This Encounter  Procedures  . Myocardial Perfusion Imaging  . EKG 12-Lead      Colleen Davis, M.D., M.S. Interventional Cardiologist   Pager # [phone] Phone # [phone] [address] [place], [place] [address]

## 2016-09-09 ENCOUNTER — Telehealth (HOSPITAL_COMMUNITY): Payer: Self-pay

## 2016-09-09 ENCOUNTER — Encounter: Payer: Self-pay | Admitting: Cardiology

## 2016-09-09 NOTE — Assessment & Plan Note (Addendum)
Not sure what the etiology for her cardiomyopathy is. Certainly has been a stressful situations was to be a Takotsubo/stress related cardiomyopathy based on wall motion changes. She does not have a lot of blood pressure in fact she is hypotensive so would not stop her losartan today and simply continue with low-dose carvedilol.  Think we are obligated in the setting of potential surgery to evaluate with Myoview stress test. She would certainly require Lexiscan. If low risk, the recommendation would be to plan surgery without further evaluation, however as intermediate to high risk, we would probably need to consider cardiac catheterization. The issue would be what we do find something on stress test that does lead to something found by catheterization. Would probably consider BMS stent versus Synergy stent with short-term hold of antiplatelet agent after 3 months.

## 2016-09-09 NOTE — Assessment & Plan Note (Signed)
Still a very poor surgical candidate. I will be very leery of invasive procedures on her as well, most notably cardiac catheterization with possible PCI. The plan for the next few months it least is to continue to try to put on weight and strength.

## 2016-09-09 NOTE — Assessment & Plan Note (Signed)
Especially now with the thought of possible ischemic retinopathy, would continue with statin although I would imagine her lipids are fairly low. They were extremely low back in 2013, I don't see any recheck labs since.

## 2016-09-09 NOTE — Assessment & Plan Note (Addendum)
With her anterior wall motion abnormality on echocardiogram, she also has markedly ST and T-wave abnormalities that would also suggest anterior ischemia. Very difficult to ignore these findings in the same thought process of preoperative risk assessment. If the plan will be for medical management, would probably not consider treating further.  We will check a Lexiscan Myoview to evaluate for ischemia.

## 2016-09-09 NOTE — Assessment & Plan Note (Signed)
At present, she has a significantly reduced EF with regional wall motion abnormalities and an abnormal EKG all suggesting the possibility of ischemic heart disease. We need to fully assess with stress test (chosen Lexiscan Myoview since she cannot walk on treadmill.)  If low risk, would proceed to the OR as she seems euvolemic. However if there is any suggestion of intermediate to high risk, she will probably need invasive evaluation with cardiac catheterization.

## 2016-09-13 NOTE — Telephone Encounter (Signed)
Encounter complete. 

## 2016-09-14 ENCOUNTER — Ambulatory Visit (HOSPITAL_COMMUNITY)
Admission: RE | Admit: 2016-09-14 | Discharge: 2016-09-14 | Disposition: A | Discharge status: Home / Self Care | Payer: Medicare Other | Source: Ambulatory Visit | Attending: Internal Medicine | Admitting: Internal Medicine

## 2016-09-14 DIAGNOSIS — I420 Dilated cardiomyopathy: Secondary | ICD-10-CM

## 2016-09-14 DIAGNOSIS — Z01810 Encounter for preprocedural cardiovascular examination: Secondary | ICD-10-CM | POA: Diagnosis present

## 2016-09-14 DIAGNOSIS — R9431 Abnormal electrocardiogram [ECG] [EKG]: Secondary | ICD-10-CM | POA: Diagnosis not present

## 2016-09-14 HISTORY — PX: NM MYOVIEW LTD: HXRAD82

## 2016-09-14 LAB — MYOCARDIAL PERFUSION IMAGING
CHL CUP NUCLEAR SRS: 1
CHL CUP NUCLEAR SSS: 3
LV sys vol: 26 mL
LVDIAVOL: 58 mL (ref 46–106)
NUC STRESS TID: 0.88
Peak HR: 117 {beats}/min
Rest HR: 75 {beats}/min
SDS: 2

## 2016-09-14 MED ORDER — TECHNETIUM TC 99M TETROFOSMIN IV KIT
10.8000 | PACK | Freq: Once | INTRAVENOUS | Status: AC | PRN
  Administered 2016-09-14: 10.8 via INTRAVENOUS
  Filled 2016-09-14: qty 11

## 2016-09-14 MED ORDER — REGADENOSON 0.4 MG/5ML IV SOLN
0.4000 mg | Freq: Once | INTRAVENOUS | Status: AC
  Administered 2016-09-14: 0.4 mg via INTRAVENOUS

## 2016-09-14 MED ORDER — AMINOPHYLLINE 25 MG/ML IV SOLN
75.0000 mg | Freq: Once | INTRAVENOUS | Status: AC
  Administered 2016-09-14: 75 mg via INTRAVENOUS

## 2016-09-14 MED ORDER — TECHNETIUM TC 99M TETROFOSMIN IV KIT
30.2000 | PACK | Freq: Once | INTRAVENOUS | Status: AC | PRN
  Administered 2016-09-14: 30.2 via INTRAVENOUS
  Filled 2016-09-14: qty 31

## 2016-09-14 NOTE — Progress Notes (Signed)
Stress Test looked good!! No sign of significant Heart Artery Disease.  Pump function is normal.  Good news!!.  HARDING,DAVID W, MD 

## 2016-09-15 ENCOUNTER — Telehealth: Payer: Self-pay | Admitting: *Deleted

## 2016-09-15 NOTE — Telephone Encounter (Signed)
Left message to call back  

## 2016-09-20 NOTE — Telephone Encounter (Signed)
PATIENT REVIEWED RESULTS VIA MYCHART

## 2016-10-07 ENCOUNTER — Ambulatory Visit (INDEPENDENT_AMBULATORY_CARE_PROVIDER_SITE_OTHER): Payer: Medicare Other | Admitting: Cardiology

## 2016-10-07 ENCOUNTER — Encounter: Payer: Self-pay | Admitting: Cardiology

## 2016-10-07 VITALS — BP 100/74 | HR 72 | Ht 62.5 in | Wt 93.5 lb

## 2016-10-07 DIAGNOSIS — I952 Hypotension due to drugs: Secondary | ICD-10-CM

## 2016-10-07 DIAGNOSIS — I5181 Takotsubo syndrome: Secondary | ICD-10-CM

## 2016-10-07 DIAGNOSIS — E43 Unspecified severe protein-calorie malnutrition: Secondary | ICD-10-CM

## 2016-10-07 DIAGNOSIS — Z01810 Encounter for preprocedural cardiovascular examination: Secondary | ICD-10-CM

## 2016-10-07 NOTE — Progress Notes (Signed)
PCP: Clovis Riley, L.August Saucer, MD  Clinic Note: Chief Complaint  Patient presents with  . Follow-up    review lexiscan results   . Pre-op Exam    HPI: Colleen Davis is a 71 y.o. female who is being seen today for follow-up evaluation of cardiomyopathy as part of preoperative risk evaluation at the request of Clovis Riley, L.August Saucer, MD & Dr. Kingsley Plan...  Colleen Davis was last seen on May 23 for preoperative evaluation for Laparoscopic Cholecystectomy. She is a extremely frail/cachectic woman with a protracted course of cholecystitis that began back in March for weakness nausea and vomiting. A CT scan showed submucosal fatty deposits in the descending colon with recommendation for colonoscopy as well as cholelithiasis with gallbladder distention. Colonoscopy cholecystectomy were recommended, but the cholecystectomy has been delayed until she is able to strength. Since March she has had multiple ER visits for pain, dehydration and symmetric cholecystitis etc. During her hospital stay, she had an echocardiogram revealing EF 30-35% with severe hypokinesis as well as septal and apical akinesis.  Recent Hospitalizations: none since last visit  Studies Personally Reviewed - (if available, images/films reviewed: From Epic Chart or Care Everywhere)  Lexiscan Myoview 09/14/2016: Normal EF 55-65%. Normal wall motion. No evidence of ischemia or infarction.  Interval History:  She's doing much better now. She is out of rehabilitation and living with her son. She is eating more and starting to gradually gain weight. She's not had any significant nausea or vomiting since the rehabilitation. She has been relatively stable from a cardiac standpoint but no PND, orthopnea or edema. No resting or exertional dyspnea although she is not really doing much activity besides walking around the house. No palpitations, lightheadedness, dizziness, weakness or syncope/near syncope. No TIA/amaurosis fugax symptoms. No  claudication.  ROS: A comprehensive was performed. Review of Systems  Constitutional: Negative for malaise/fatigue (Gradually building up energy).  HENT: Negative for congestion, hearing loss and nosebleeds.   Respiratory: Negative for cough and shortness of breath.   Cardiovascular: Negative.        Per history of present illness  Gastrointestinal: Negative for abdominal pain, blood in stool, melena, nausea and vomiting.  Genitourinary: Negative for hematuria.  Musculoskeletal: Negative for falls and joint pain.  Neurological: Positive for dizziness (Sometimes when she stands up quickly) and weakness (Still whole-body weakness). Negative for focal weakness.  Psychiatric/Behavioral: Negative for memory loss. The patient is not nervous/anxious and does not have insomnia.   All other systems reviewed and are negative.   I have reviewed and (if needed) personally updated the patient's problem list, medications, allergies, past medical and surgical history, social and family history.   Past Medical History:  Diagnosis Date  . Anxiety   . Cardiomyopathy (HCC) 08/2016   EF 35% with suggestion of anterior wall motion abnormality. Cannot exclude ischemic versus Takotsubo  . Chronic kidney disease    polycystic  . Dehydration    recently hosp  . Depression   . History of kidney stones    cyst on kidney  . Hyperlipidemia   . Hypertension   . Hypokalemia     Past Surgical History:  Procedure Laterality Date  . DILATION AND CURETTAGE OF UTERUS    . NM MYOVIEW LTD  09/14/2016   Normal EF 55-65%. Normal wall motion. No evidence of ischemia or infarction.  . TRANSTHORACIC ECHOCARDIOGRAM  08/18/2016   ** in setting of sepsis/cholecystitis: EF 30-35% with severe hypokinesis of the distal septal, distal inferior, mid/distal anterior, mid/distal anterolateral  walls and Akinesis of the apex. The cavity size was normal. Wall thickness was normal. Trivial Aortic regurgitation. Mitral Mild MR  .  TUBAL LIGATION      Current Meds  Medication Sig  . ALPRAZolam (XANAX) 0.25 MG tablet Take 0.25 mg by mouth 3 (three) times daily as needed for anxiety.   Marland Kitchen aspirin EC 81 MG tablet Take 1 tablet (81 mg total) by mouth daily.  Marland Kitchen atorvastatin (LIPITOR) 20 MG tablet Take 20 mg by mouth daily at 6 PM.  . carvedilol (COREG) 3.125 MG tablet Take 1 tablet (3.125 mg total) by mouth 2 (two) times daily with a meal.  . cyanocobalamin (,VITAMIN B-12,) 1000 MCG/ML injection Inject 1 mL (1,000 mcg total) into the skin once a week.  . feeding supplement (BOOST / RESOURCE BREEZE) LIQD Take 1 Container by mouth 2 (two) times daily between meals.  . feeding supplement, ENSURE ENLIVE, (ENSURE ENLIVE) LIQD Take 237 mLs by mouth 2 (two) times daily between meals.  . folic acid (FOLVITE) 1 MG tablet Take 1 tablet (1 mg total) by mouth daily.  Marland Kitchen losartan (COZAAR) 25 MG tablet Take 1 tablet (25 mg total) by mouth daily.  . magnesium hydroxide (MILK OF MAGNESIA) 400 MG/5ML suspension Take 15 mLs by mouth daily as needed for mild constipation.  . ondansetron (ZOFRAN) 4 MG tablet Take 1 tablet (4 mg total) by mouth every 6 (six) hours. (Patient taking differently: Take 4 mg by mouth every 6 (six) hours as needed for nausea. )  . polyethylene glycol (MIRALAX / GLYCOLAX) packet Take 17 g by mouth daily.  . potassium chloride (K-DUR) 10 MEQ tablet Take 1 tablet (10 mEq total) by mouth daily.  Marland Kitchen senna-docusate (SENOKOT-S) 8.6-50 MG tablet Take 1 tablet by mouth 2 (two) times daily.  Marland Kitchen thiamine (VITAMIN B-1) 100 MG tablet Take 1 tablet (100 mg total) by mouth daily.    Allergies  Allergen Reactions  . No Known Allergies     Social History   Social History  . Marital status: Divorced    Spouse name: N/A  . Number of children: N/A  . Years of education: N/A   Social History Main Topics  . Smoking status: Never Smoker  . Smokeless tobacco: Never Used  . Alcohol use No  . Drug use: No  . Sexual activity: Not  Asked   Other Topics Concern  . None   Social History Narrative  . None    family history includes Colon cancer in her sister; Hypertension in her sister; Lung cancer in her father; Other in her sister.  Wt Readings from Last 3 Encounters:  10/07/16 93 lb 8 oz (42.4 kg)  09/14/16 87 lb (39.5 kg)  09/07/16 87 lb (39.5 kg)    PHYSICAL EXAM BP 100/74   Pulse 72   Ht 5' 2.5" (1.588 m)   Wt 93 lb 8 oz (42.4 kg)   BMI 16.83 kg/m  General appearance: Thin, frail woman who appears older than her stated age. She looks better than when she last time. Less temporal wasting. No acute distress. HEENT: [place]/AT, EOMI, MMM, anicteric sclera Neck: no adenopathy, no carotid bruit and no JVD Lungs: clear to auscultation bilaterally, normal percussion bilaterally and non-labored Heart: regular rate and rhythm, S1 &S2 normal, no murmur, click, rub or gallop; hyperdynamic precordium. Nondisplaced PMI Abdomen: Scaphoid abdomen. soft, non-tender; bowel sounds normal; no masses,  no organomegaly; no HJR Extremities: extremities normal, atraumatic, no cyanosis, or edema Pulses: 2+ and  symmetric; Neurologic: Mental status: Alert & oriented x 3, thought content appropriate; non-focal exam.  Pleasant mood & affect.    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 With resolution of cardiomyopathy, we may be able to d/c all together (OK to hold per-op - notably the morning of).      Preoperative cardiovascular examination - Primary     From a cardiac standpoint with her Myoview now showing resolution of her cardiomyopathy with no evidence of ischemia, she would likely be considered to be low risk for an intermediate to high risk surgery depending on if it is laparoscopic or not.  Would continue current dose of beta blocker, but would hold losartan preop on the day of surgery.  She is euvolemic now with no signs of heart failure. Would avoid aggressive fluid management however.      Severe protein-calorie malnutrition (HCC) (Chronic)    Hopefully she is getting stronger. Has gained 6 pounds since I last saw her. She has resolved Takotsubo cardiomyopathy and appears to be overall healthy feeling stronger.  Hopefully soon to be one have her surgery. Then postoperatively will need aggressive nutrition management to ensure that she states to recover.      Takotsubo cardiomyopathy - resolved (Chronic)    Her echocardiogram in the setting of acute illness was somewhat suggestive of possible stress-induced cardiac myopathy/Takotsubo. Now with a Myoview showing essentially resolution of the reduced ejection fraction with no wall motion abnormalities and no signs ischemia, no need for invasive evaluation.  Essentially resolved Takotsubo cardiomyopathy. Would continue low-dose carvedilol for perioperative protection. I am reducing losartan dose because of her borderline hypotension. She will take a half tablet. I will have her hold this preop and not restart unless blood pressures are stable over 100 mmHg.         Current medicines are reviewed at length with the patient today. (+/- concerns) none The following changes have been made: Reduce losartan to one half tablet daily  Okay for surgery  Patient Instructions  NO CHANGE WITH TREATMENT  OR MEDICATION     CARDIAC CLEARANCE FOR SURGERY WITH DR Corliss Skains. Do not take Losartan the day of surgery NO FURTHER TESTING NEEDED.    Your physician recommends that you schedule a  follow-up appointment in Oct 2018 with DR [facility] to reevaluate. May be able to then return as needed.    . Studies Ordered:   No orders of the defined types were placed in this encounter.     Bryan Lemma, M.D., M.S. Interventional Cardiologist   Pager # [phone] Phone # [phone] [address] [place], [place] [address]

## 2016-10-07 NOTE — Assessment & Plan Note (Signed)
From a cardiac standpoint with her Myoview now showing resolution of her cardiomyopathy with no evidence of ischemia, she would likely be considered to be low risk for an intermediate to high risk surgery depending on if it is laparoscopic or not.  Would continue current dose of beta blocker, but would hold losartan preop on the day of surgery.  She is euvolemic now with no signs of heart failure. Would avoid aggressive fluid management however.

## 2016-10-07 NOTE — Assessment & Plan Note (Signed)
Her echocardiogram in the setting of acute illness was somewhat suggestive of possible stress-induced cardiac myopathy/Takotsubo. Now with a Myoview showing essentially resolution of the reduced ejection fraction with no wall motion abnormalities and no signs ischemia, no need for invasive evaluation.  Essentially resolved Takotsubo cardiomyopathy. Would continue low-dose carvedilol for perioperative protection. I am reducing losartan dose because of her borderline hypotension. She will take a half tablet. I will have her hold this preop and not restart unless blood pressures are stable over 100 mmHg.

## 2016-10-07 NOTE — Patient Instructions (Addendum)
NO CHANGE WITH TREATMENT  OR MEDICATION     CARDIAC CLEARANCE FOR SURGERY WITH DR TSUEI. Do not take Losartan the day of surgery NO FURTHER TESTING NEEDED.    Your physician recommends that you schedule a follow-up appointment in Oct 2018 with DR [facility] to reevaluate. May be able to then return as needed.    Marland Kitchen

## 2016-10-07 NOTE — Assessment & Plan Note (Signed)
Hopefully she is getting stronger. Has gained 6 pounds since I last saw her. She has resolved Takotsubo cardiomyopathy and appears to be overall healthy feeling stronger.  Hopefully soon to be one have her surgery. Then postoperatively will need aggressive nutrition management to ensure that she states to recover.

## 2016-10-09 NOTE — Assessment & Plan Note (Signed)
She did not fully stop losartan as suggested last visit - will cut dose in half for now given borderline BP. With resolution of cardiomyopathy, we may be able to d/c all together (OK to hold per-op - notably the morning of).

## 2016-10-10 ENCOUNTER — Telehealth: Payer: Self-pay | Admitting: Cardiology

## 2016-10-10 NOTE — Telephone Encounter (Signed)
Patient was notified that Losartan 25 mg was the lowest dose possible and that it should be cut in half. Patient verbalized understanding.

## 2016-10-10 NOTE — Telephone Encounter (Signed)
New message     Is Dr Herbie Baltimore calling in a lower dosage of the losartan or is she suppose to cut the pill in half?

## 2016-11-02 ENCOUNTER — Encounter: Payer: Self-pay | Admitting: Family Medicine

## 2016-11-02 ENCOUNTER — Telehealth: Payer: Self-pay | Admitting: Family Medicine

## 2016-11-02 ENCOUNTER — Ambulatory Visit (INDEPENDENT_AMBULATORY_CARE_PROVIDER_SITE_OTHER): Payer: Medicare Other | Admitting: Family Medicine

## 2016-11-02 VITALS — BP 100/70 | HR 78 | Resp 12 | Ht 61.81 in | Wt 97.2 lb

## 2016-11-02 DIAGNOSIS — E876 Hypokalemia: Secondary | ICD-10-CM | POA: Diagnosis not present

## 2016-11-02 DIAGNOSIS — N644 Mastodynia: Secondary | ICD-10-CM

## 2016-11-02 DIAGNOSIS — Z0000 Encounter for general adult medical examination without abnormal findings: Secondary | ICD-10-CM | POA: Diagnosis not present

## 2016-11-02 DIAGNOSIS — R2681 Unsteadiness on feet: Secondary | ICD-10-CM | POA: Insufficient documentation

## 2016-11-02 DIAGNOSIS — I10 Essential (primary) hypertension: Secondary | ICD-10-CM | POA: Diagnosis not present

## 2016-11-02 DIAGNOSIS — R64 Cachexia: Secondary | ICD-10-CM

## 2016-11-02 DIAGNOSIS — K8020 Calculus of gallbladder without cholecystitis without obstruction: Secondary | ICD-10-CM | POA: Diagnosis not present

## 2016-11-02 LAB — BASIC METABOLIC PANEL
BUN: 23 mg/dL (ref 6–23)
CHLORIDE: 106 meq/L (ref 96–112)
CO2: 30 meq/L (ref 19–32)
CREATININE: 0.83 mg/dL (ref 0.40–1.20)
Calcium: 9.6 mg/dL (ref 8.4–10.5)
GFR: 72.23 mL/min (ref >60.00)
Glucose, Bld: 91 mg/dL (ref 70–99)
Potassium: 4.1 mEq/L (ref 3.5–5.1)
Sodium: 142 mEq/L (ref 135–145)

## 2016-11-02 MED ORDER — ZOSTER VAC RECOMB ADJUVANTED 50 MCG/0.5ML IM SUSR: 0.5000 mL | Freq: Once | INTRAMUSCULAR | 0 refills | Status: AC

## 2016-11-02 NOTE — Progress Notes (Signed)
HPI:   [name] is a 71 y.o. female, who is here today with her sister to establish care.  Former PCP: Dr Clovis Riley at [facility], [place]. Last preventive routine visit: 10/18/16. Colonoscopy was cancelled because she could not tolerate prep due to nausea and vomiting.  Chronic medical problems: CKD III,HTN, hearing loss,and anxiety disorder among some.  She recently moved with her son and his fiance. She lives with them and her grandchild and his fiance's teenager child. She was hospitalized from 08/17/16 to 08/25/16.  She presented to the ER c/o persistent nausea, vomiting, and wt loss and MS changes.  Hypo K+ at 2.4 and hypo Mg.  Dx with acute encephalitis, metabolic and nutritional: Severe calories malnutrition, wernicke's encephalopathy. Also some of her symptoms were attributed to polypharmacy. Neuro consultation while she was in the hospital, no further work-up was recommended outpatient.  Brain MRI 08/21/16:  No acute intracranial abnormality and no change from the 2013 brain MRI identified on this intermittently motion degraded study which had to be discontinued prior to completion due to patient agitation despite medical anxiolysis.  Head CT x 2 (08/18/16 and 09/06/16: No acute intracranial abnormalities.   Lab Results  Component Value Date   TSH 0.336 (L) 08/17/2016   Started on B12 and B1 supplementation. Speech therapy and dysphagia type III diet.  Lab Results  Component Value Date   VITAMINB12 267 08/19/2016    She also had Hx of cholelithiasis, denies symptoms. Surgical consultation was recommended but she is not interested in undergoing cholecystectomy. Gastritis found on GI work-up.  07/15/16 abdominal CT:  1. Polycystic appearance of the kidneys with associated cysts of the liver which may reflect autosomal dominant polycystic renal disease. 2. Mild submucosal fatty deposition within cecum ascending colon without acute bowel inflammation or  obstruction noted. Findings may reflect sequela of inflammatory bowel disease. 3. Cholelithiasis with gallbladder distention possibly from a fasting state. No wall thickening or pericholecystic fluid however is noted.   Denies abdominal pain, nausea, vomiting, changes in bowel habits, blood in stool or melena. Last bowel movement today, she is taking Miralax daily.   Systolic CHF: Thought to be stress related  LVEF 30-35% with severe diffuse hypokinesis and apex akinesis.  She denies chest pain, dyspnea, orthopnea,PND,or edema.  On 09/07/16 she was evaluated by Dr Herbie Baltimore , cardiologists, f/u on 10/07/16   Lexiscan Myoview 09/09/16: Normal wall motion, EF 55-65%, no evidence for ischemia or infarction. Takotsubo (stress) cardiomyopathy resolved. She was cleared for surgery.   She was discharged to a SNF and discharged home about 6 weeks ago. She is c/o poor balance, she is using a walker but at the time of her discharge she was not ambulatory, wheel chair. Intermittent lightheadedness mainly when getting up, slowly improving after hospital discharge.  She still uses wheel chair when she needs to buy groceries.   She was also in the ER on 10/07/16 because MS changes, found unresponsive on floor while she was at the SNF. She was discharged after improvement, otherwise reassuring work-up, and back to her baseline, did not need hospitalization at this time.   She attributes all her complications and acute illness to mold exposure, she was living independently in a mobile home before hospitalization.  She denies MS changes and her sister support it. No falls since her discharge. Sleeping well, 9 hours in average. Appetite has improve and she has noted wt gain. She is following a regular diet, denies dysphagia. She  wants to know if she can eat hamburgers daily.   She denies alcohol abuse.   HTN: Dx around 2009.  Hx of CKD III. She was on Cozaar 25 mg but it was discontinued by Dr  Francis Gaines because hypotension.  Lisinopril-HCTZ was discontinued.  Current;y she is on Carvedilol 3.125 mg bid.  Last eye exam a few weeks ago. She is following a low salt diet. Not checking BP's at home. Denies headache, visual changes, palpitation, claudication, focal weakness, or edema.  Hypo K+, she is on K-DUR 10 meq daily.  She is c/o bilateral breast "tightness" like pain. She has not noted masses or nipple discharge. Pain is mild, intermittent, and she has not identified exacerbating or alleviating factors. No Hx of trauma or skin lesions appreciated. Last mammogram around 20 years ago.  She also has a 2 pages letter with her. Her daughter in law has some concerns, she could not be here today.   From letter: She needs second dose of zoster vaccine. POA : her son (Chist and his fiance) Requesting B12 injections. She is on B12 oral supplementation. Mammogram request. Occasional lightheadedness and "very weak." "Very picky eater", "she she eat red meat?" Requesting colonoscopy referral. She mentions + FHX for colon cancer.  Some of these concerns were already discussed with pt as above described.   Review of Systems  Constitutional: Positive for appetite change (improving) and fatigue. Negative for activity change and fever.  HENT: Negative for mouth sores, nosebleeds and trouble swallowing.   Eyes: Negative for redness and visual disturbance.  Respiratory: Negative for cough, shortness of breath and wheezing.   Cardiovascular: Negative for chest pain, palpitations and leg swelling.  Gastrointestinal: Negative for abdominal pain, nausea and vomiting.       Negative for changes in bowel habits.  Endocrine: Negative for cold intolerance, heat intolerance, polydipsia, polyphagia and polyuria.  Genitourinary: Negative for decreased urine volume, dysuria and hematuria.  Musculoskeletal: Positive for gait problem. Negative for myalgias.  Skin: Negative for rash and wound.    Neurological: Positive for light-headedness. Negative for seizures, syncope, weakness and headaches.  Hematological: Negative for adenopathy. Does not bruise/bleed easily.  Psychiatric/Behavioral: Negative for confusion. The patient is nervous/anxious.       Current Outpatient Prescriptions on File Prior to Visit  Medication Sig Dispense Refill  . aspirin EC 81 MG tablet Take 1 tablet (81 mg total) by mouth daily. 30 tablet 0  . atorvastatin (LIPITOR) 20 MG tablet Take 20 mg by mouth daily at 6 PM.    . carvedilol (COREG) 3.125 MG tablet Take 1 tablet (3.125 mg total) by mouth 2 (two) times daily with a meal. 30 tablet 0  . folic acid (FOLVITE) 1 MG tablet Take 1 tablet (1 mg total) by mouth daily. 30 tablet 0  . polyethylene glycol (MIRALAX / GLYCOLAX) packet Take 17 g by mouth daily. 14 each 0  . potassium chloride (K-DUR) 10 MEQ tablet Take 1 tablet (10 mEq total) by mouth daily. 30 tablet 0  . thiamine (VITAMIN B-1) 100 MG tablet Take 1 tablet (100 mg total) by mouth daily. 30 tablet 0   No current facility-administered medications on file prior to visit.      Past Medical History:  Diagnosis Date  . Anxiety   . Cardiomyopathy (HCC) 08/2016   EF 35% with suggestion of anterior wall motion abnormality. Cannot exclude ischemic versus Takotsubo  . Chicken pox   . Chronic kidney disease    polycystic  .  Dehydration    recently hosp  . Depression   . History of kidney stones    cyst on kidney  . Hypertension   . Hypokalemia    Allergies  Allergen Reactions  . No Known Allergies     Family History  Problem Relation Age of Onset  . Mental illness Mother        "Dementia"  . Hyperlipidemia Mother   . Lung cancer Father   . Hypertension Father   . Hypertension Sister   . Other Sister        non-alcohol fatty disease  . Colon cancer Sister     Social History   Social History  . Marital status: Single    Spouse name: N/A  . Number of children: N/A  . Years of  education: N/A   Social History Main Topics  . Smoking status: Never Smoker  . Smokeless tobacco: Never Used  . Alcohol use No  . Drug use: No  . Sexual activity: Not Currently   Other Topics Concern  . None   Social History Narrative  . None    Vitals:   11/02/16 1128  BP: 100/70  Pulse: 78  Resp: 12   O2 sat at RA 97% Body mass index is 17.9 kg/m.   Physical Exam  Nursing note and vitals reviewed. Constitutional: She is oriented to person, place, and time. She appears well-developed. She appears cachectic. No distress.  HENT:  Head: Atraumatic.  Mouth/Throat: Oropharynx is clear and moist and mucous membranes are normal.  Eyes: Pupils are equal, round, and reactive to light. Conjunctivae and EOM are normal.  Neck: No JVD present.  Cardiovascular: Normal rate and regular rhythm.   No murmur heard. Pulses:      Dorsalis pedis pulses are 2+ on the right side, and 2+ on the left side.  Hyperdynamic.  Respiratory: Effort normal and breath sounds normal. No respiratory distress.  GI: Soft. She exhibits no mass. There is no hepatomegaly. There is no tenderness.  Genitourinary: There is breast tenderness. No breast swelling.  Genitourinary Comments: Breast no masses bilateral, mild nodularity, fibrocystic like changes bilateral but no masses. No Nipple discharge, mild tenderness with palpation. No skin changes.  Musculoskeletal: She exhibits no edema or tenderness.  Lymphadenopathy:    She has no cervical adenopathy.    She has no axillary adenopathy.  Neurological: She is alert and oriented to person, place, and time. She has normal strength. Coordination normal.  Gait unstable,assisted by sister.  Skin: Skin is warm. No rash noted. No erythema.  Psychiatric: She has a normal mood and affect.  Well groomed, good eye contact.    ASSESSMENT AND PLAN:   [name] was seen today for establish care.  Diagnoses and all orders for this visit:  Essential  hypertension  BP on lower normal range. For now no changes in Coreg, recommend monitoring BP at home, and adequate hydration. F/U in 2 months.  -     Basic metabolic panel  Hypokalemia  No changes in current management, will follow labs done today and will give further recommendations accordingly.  -     Basic metabolic panel  Cachexia (HCC)  Reporting that appetite is better, gaining wt, and tolerating regular diet. Recommend caution with eating hamburgers very often or red meat, both can increase lipids. Instead increase intake of chicken,fish,beands,and vegetable. We will continue following. F/U in 2 months.  Breast tenderness in female -     MM Digital Diagnostic Bilat;  Future  Calculus of gallbladder without cholecystitis without obstruction  Currently she is asymptomatic, she was already cleared  For surgery by Dr Herbie Baltimore. She is not interested in having surgery at this time. We discussed symptoms that will indicate an acute exacerbation. Instructed about warning signs.  Unstable gait  Fall prevention discussed. She was instructed to continue using walker. Explained that gait issues are not uncommon after hospitalizations and/or serious illness that required bed rest.   Healthcare maintenance  Rx for second dose of Shingrix given today. In regard to colonoscopy, I recommend holding on it until she has gained wt and clinically more stable. She is already established with GI and can re-schedule procedure.  -     Zoster Vac Recomb Adjuvanted [facility]) injection; Inject 0.5 mLs into the muscle once.      Colleen Cybulski G. [place], MD  [facility]. [place] office.

## 2016-11-02 NOTE — Patient Instructions (Signed)
A few things to remember from today's visit:   Essential hypertension - Plan: Basic metabolic panel  Hypokalemia - Plan: Basic metabolic panel  NO changes today. Fall prevention.  Monitor blood pressure at home if possible.  DASH diet recommended: high in vegetables, fruits, low-fat dairy products, whole grains, poultry, fish, and nuts; and low in sweets, sugar-sweetened beverages, and red meats.    Please be sure medication list is accurate. If a new problem present, please set up appointment sooner than planned today.

## 2016-11-02 NOTE — Telephone Encounter (Signed)
Called and spoke with patient's daughter in law. She was just wanting to make sure they had given you the papers that they had brought with them. She would like for you to review them and let her know what we can do in regards to what is in the letter. She was unable to make it to the appointment today.   Please review when you have time & let me know.  Thanks!

## 2016-11-02 NOTE — Telephone Encounter (Signed)
Pts daughter-in-law calling and would like to have a call back.

## 2016-11-03 ENCOUNTER — Encounter: Payer: Self-pay | Admitting: Family Medicine

## 2016-11-05 ENCOUNTER — Encounter: Payer: Self-pay | Admitting: Family Medicine

## 2016-11-05 NOTE — Telephone Encounter (Signed)
All concerns on the 2 pages letter were addressed during OV.  We planned on follow up appt to be sure she is stable and improving.  Thanks, BJ

## 2016-11-07 NOTE — Telephone Encounter (Signed)
Called and spoke with patient's daughter in law. She is concerned about if patient needs the B12 anymore since they were giving it to her in the nursing home. I advised her that we did not check her B12 level when she was here, but that she is suppose to come back in 2 months. She is also wondering about in home therapy since she was sitting so much in the nursing home and she is very weak.   Okay to do a referral for in home PT?

## 2016-11-10 ENCOUNTER — Other Ambulatory Visit: Payer: Self-pay | Admitting: Family Medicine

## 2016-11-10 DIAGNOSIS — N644 Mastodynia: Secondary | ICD-10-CM

## 2016-11-14 ENCOUNTER — Telehealth: Payer: Self-pay | Admitting: Family Medicine

## 2016-11-14 MED ORDER — CARVEDILOL 3.125 MG PO TABS: 3.1250 mg | ORAL_TABLET | Freq: Two times a day (BID) | ORAL | 1 refills | Status: DC

## 2016-11-14 NOTE — Telephone Encounter (Signed)
Left detailed message on daughter in law's voicemail & sent refill into pharmacy.

## 2016-11-14 NOTE — Telephone Encounter (Signed)
Please advise on what patient can take for her cough/runny nose.   Thanks!

## 2016-11-14 NOTE — Telephone Encounter (Signed)
If she is interested in doing PT, it is Ok to place referral. Francis Dowse was just discharged from [facility]].  Thanks, BJ

## 2016-11-14 NOTE — Telephone Encounter (Signed)
Nasal saline for runny nose and honey at bedtime for cough. If symptoms get worse, she needs to be seen.  Thanks, BJ

## 2016-11-14 NOTE — Telephone Encounter (Signed)
Pt need a new rx carvedilol 3.125 mg #180 new pharm [facility] [address]. Pt daughter in law would like to know cough med she can give patient. Pt cough and running nose

## 2016-11-14 NOTE — Telephone Encounter (Signed)
Will you take a look at the referral and make sure it's correct?  Thanks!

## 2016-11-15 NOTE — Telephone Encounter (Signed)
PT referral completed. BJ

## 2016-11-16 ENCOUNTER — Telehealth: Payer: Self-pay | Admitting: Family Medicine

## 2016-11-16 NOTE — Telephone Encounter (Signed)
Pt had a fall approximately on 11/12/16 with new pain to R hip and significant has reduced.  Pt would benefit from referral to nursing due to chronic malnourishment and benefit referral to OT for ADL's.  Colleen Davis needs a medication list to include for  [phone]

## 2016-11-16 NOTE — Telephone Encounter (Signed)
Referral for OT/PT has already been completed. Okay to do the nursing home referral?

## 2016-11-17 NOTE — Telephone Encounter (Signed)
She does not need referral for NH [as far as I know]. This needs to be arranged by family and forms can be dropped off to be completed.  Thanks, BJ

## 2016-11-18 NOTE — Telephone Encounter (Signed)
HH referral was already placed. OK to give verbal authorization to start PT and for OT evaluation. Thanks, BJ

## 2016-11-18 NOTE — Telephone Encounter (Signed)
Verbal order left on Jim's voicemail.

## 2016-11-18 NOTE — Telephone Encounter (Signed)
Colleen Davis is wanting to know if it is okay for verbal OT/PT orders for ADL's and also to have a home health nurse come out.

## 2016-11-23 ENCOUNTER — Telehealth: Payer: Self-pay | Admitting: Family Medicine

## 2016-11-23 NOTE — Telephone Encounter (Signed)
Verbal okay? 

## 2016-11-23 NOTE — Telephone Encounter (Signed)
FYI Pt daugther is out of town and daughter is request [facility] rn to come out next week for nursing visit

## 2016-11-24 ENCOUNTER — Encounter: Payer: Self-pay | Admitting: Adult Health

## 2016-11-24 ENCOUNTER — Ambulatory Visit (INDEPENDENT_AMBULATORY_CARE_PROVIDER_SITE_OTHER): Payer: Medicare Other | Admitting: Adult Health

## 2016-11-24 VITALS — BP 126/80 | HR 88 | Temp 98.8°F | Ht 61.0 in | Wt 94.0 lb

## 2016-11-24 DIAGNOSIS — J0140 Acute pansinusitis, unspecified: Secondary | ICD-10-CM | POA: Diagnosis not present

## 2016-11-24 MED ORDER — DOXYCYCLINE HYCLATE 100 MG PO CAPS: 100.0000 mg | ORAL_CAPSULE | Freq: Two times a day (BID) | ORAL | 0 refills | Status: DC

## 2016-11-24 NOTE — Progress Notes (Signed)
Subjective:    Patient ID: Colleen Davis, female    DOB: 09/26/1945, 71 y.o.   MRN: [id number]  Sinusitis  This is a new problem. The current episode started 1 to 4 weeks ago (2 weeks). There has been no fever. Associated symptoms include congestion, coughing, headaches, shortness of breath and sinus pressure. Pertinent negatives include no ear pain. Past treatments include acetaminophen. The treatment provided no relief.      Review of Systems  Constitutional: Negative.   HENT: Positive for congestion, rhinorrhea, sinus pain and sinus pressure. Negative for ear pain.        Gum pain   Respiratory: Positive for cough and shortness of breath.   Cardiovascular: Negative.   Neurological: Positive for headaches.   Past Medical History:  Diagnosis Date  . Anxiety   . Cardiomyopathy (HCC) 08/2016   EF 35% with suggestion of anterior wall motion abnormality. Cannot exclude ischemic versus Takotsubo  . Chicken pox   . Chronic kidney disease    polycystic  . Dehydration    recently hosp  . Depression   . History of kidney stones    cyst on kidney  . Hypertension   . Hypokalemia     Social History   Social History  . Marital status: Single    Spouse name: N/A  . Number of children: N/A  . Years of education: N/A   Occupational History  . Not on file.   Social History Main Topics  . Smoking status: Never Smoker  . Smokeless tobacco: Never Used  . Alcohol use No  . Drug use: No  . Sexual activity: Not Currently   Other Topics Concern  . Not on file   Social History Narrative  . No narrative on file    Past Surgical History:  Procedure Laterality Date  . DILATION AND CURETTAGE OF UTERUS    . NM MYOVIEW LTD  09/14/2016   Normal EF 55-65%. Normal wall motion. No evidence of ischemia or infarction.  . TRANSTHORACIC ECHOCARDIOGRAM  08/18/2016   ** in setting of sepsis/cholecystitis: EF 30-35% with severe hypokinesis of the distal septal, distal inferior,  mid/distal anterior, mid/distal anterolateral walls and Akinesis of the apex. The cavity size was normal. Wall thickness was normal. Trivial Aortic regurgitation. Mitral Mild MR  . TUBAL LIGATION      Family History  Problem Relation Age of Onset  . Mental illness Mother        "Dementia"  . Hyperlipidemia Mother   . Lung cancer Father   . Hypertension Father   . Hypertension Sister   . Other Sister        non-alcohol fatty disease  . Colon cancer Sister     Allergies  Allergen Reactions  . No Known Allergies     Current Outpatient Prescriptions on File Prior to Visit  Medication Sig Dispense Refill  . aspirin EC 81 MG tablet Take 1 tablet (81 mg total) by mouth daily. 30 tablet 0  . atorvastatin (LIPITOR) 20 MG tablet Take 20 mg by mouth daily at 6 PM.    . carvedilol (COREG) 3.125 MG tablet Take 1 tablet (3.125 mg total) by mouth 2 (two) times daily with a meal. 180 tablet 1  . folic acid (FOLVITE) 1 MG tablet Take 1 tablet (1 mg total) by mouth daily. 30 tablet 0  . polyethylene glycol (MIRALAX / GLYCOLAX) packet Take 17 g by mouth daily. 14 each 0  . potassium chloride (K-DUR) 10 MEQ  tablet Take 1 tablet (10 mEq total) by mouth daily. 30 tablet 0  . thiamine (VITAMIN B-1) 100 MG tablet Take 1 tablet (100 mg total) by mouth daily. 30 tablet 0   No current facility-administered medications on file prior to visit.     BP 126/80 (BP Location: Left Arm)   Pulse 88   Temp 98.8 F (37.1 C) (Oral)   Ht 5\' 1"  (1.549 m)   Wt 94 lb (42.6 kg)   SpO2 98%   BMI 17.76 kg/m       Objective:   Physical Exam  Constitutional: She appears well-developed and well-nourished. No distress.  HENT:  Right Ear: Hearing, tympanic membrane, external ear and ear canal normal.  Left Ear: Hearing, tympanic membrane, external ear and ear canal normal.  Nose: Mucosal edema and rhinorrhea present. Right sinus exhibits frontal sinus tenderness. Left sinus exhibits maxillary sinus tenderness and  frontal sinus tenderness.  Mouth/Throat: Uvula is midline and oropharynx is clear and moist. She has dentures.  Cardiovascular: Normal rate, regular rhythm, normal heart sounds and intact distal pulses.  Exam reveals no gallop and no friction rub.   No murmur heard. Pulmonary/Chest: Effort normal and breath sounds normal. No respiratory distress. She has no wheezes. She has no rales. She exhibits no tenderness.  Neurological: She is alert.  Skin: Skin is warm and dry. No rash noted. She is not diaphoretic. No erythema. No pallor.  Psychiatric: She has a normal mood and affect. Her behavior is normal. Judgment and thought content normal.  Nursing note and vitals reviewed.     Assessment & Plan:  1. Acute pansinusitis, recurrence not specified - Can add Flonase  - doxycycline (VIBRAMYCIN) 100 MG capsule; Take 1 capsule (100 mg total) by mouth 2 (two) times daily.  Dispense: 14 capsule; Refill: 0 - Follow up if no improvement in the next 2-3 days   Shirline Frees, NP

## 2016-11-25 NOTE — Telephone Encounter (Signed)
Verbal given 

## 2016-11-25 NOTE — Telephone Encounter (Signed)
It is Ok to move nurse visit for next week as requested.  Thanks, BJ

## 2016-11-28 ENCOUNTER — Telehealth: Payer: Self-pay | Admitting: Family Medicine

## 2016-11-28 NOTE — Telephone Encounter (Signed)
Patty from [facility] called requesting to speak with Dr. Elvis Coil nurse about nursing order needs for this patient. When offered to get additional information of exact orders she stated she would rather go over with her nurse and to inform her she needs to discuss this today. Message forwards to nurse.

## 2016-11-28 NOTE — Telephone Encounter (Signed)
Called and spoke with Patty. She went and visited patient today, her blood pressure sitting was 104/70 and 96/66 standing; patient is taking the Carvedilol twice daily as prescribed. She has been feeling weak, on Friday she had tried to get up from the couch and she couldn't get up. She felt weak for the remaining of Friday, and started to feel better on Saturday and today as well, but still feeling a bit weak. She has been coughing up green sputum, but she is on an antibiotic, which is also making her nauseous. Patty if wondering if it is okay for patient to take Mucinex.   Patty also needs verbal orders for 2x a week for the next 2 weeks.

## 2016-11-29 ENCOUNTER — Telehealth: Payer: Self-pay | Admitting: Family Medicine

## 2016-11-29 NOTE — Telephone Encounter (Signed)
Went over message below with Patty and verbal orders given.

## 2016-11-29 NOTE — Telephone Encounter (Signed)
Please decrease Coreg to 1/2 tab bid and continue monitoring BP. We may need to wean off BB if BP still <100/60. Adequate hydration.  Thanks, BJ

## 2016-11-29 NOTE — Telephone Encounter (Signed)
Pt daughter would like to have a call to discuss in detail why the pt is coming in.

## 2016-11-30 ENCOUNTER — Encounter: Payer: Self-pay | Admitting: Family Medicine

## 2016-11-30 ENCOUNTER — Telehealth: Payer: Self-pay | Admitting: Family Medicine

## 2016-11-30 ENCOUNTER — Ambulatory Visit (INDEPENDENT_AMBULATORY_CARE_PROVIDER_SITE_OTHER): Payer: Medicare Other | Admitting: Family Medicine

## 2016-11-30 VITALS — BP 110/68 | HR 83 | Resp 12 | Ht 61.0 in | Wt 91.4 lb

## 2016-11-30 DIAGNOSIS — H8110 Benign paroxysmal vertigo, unspecified ear: Secondary | ICD-10-CM | POA: Diagnosis not present

## 2016-11-30 DIAGNOSIS — R112 Nausea with vomiting, unspecified: Secondary | ICD-10-CM

## 2016-11-30 DIAGNOSIS — Z1211 Encounter for screening for malignant neoplasm of colon: Secondary | ICD-10-CM | POA: Diagnosis not present

## 2016-11-30 DIAGNOSIS — I10 Essential (primary) hypertension: Secondary | ICD-10-CM | POA: Diagnosis not present

## 2016-11-30 DIAGNOSIS — R2681 Unsteadiness on feet: Secondary | ICD-10-CM

## 2016-11-30 MED ORDER — FOLIC ACID 1 MG PO TABS: 1.0000 mg | ORAL_TABLET | Freq: Every day | ORAL | 0 refills | Status: DC

## 2016-11-30 MED ORDER — ONDANSETRON HCL 4 MG PO TABS: 4.0000 mg | ORAL_TABLET | Freq: Three times a day (TID) | ORAL | 0 refills | Status: AC | PRN

## 2016-11-30 MED ORDER — MECLIZINE HCL 25 MG PO TABS: 25.0000 mg | ORAL_TABLET | Freq: Every evening | ORAL | 0 refills | Status: DC | PRN

## 2016-11-30 MED ORDER — VITAMIN B-1 100 MG PO TABS: 100.0000 mg | ORAL_TABLET | Freq: Every day | ORAL | 0 refills | Status: DC

## 2016-11-30 NOTE — Telephone Encounter (Signed)
Patient continue to have bp drop from sitting to standing  120/80 sitting 100/70 Patient report started on  antibiotic   Since then she has had moderate to severe With a significant increase in dizziness  Patient report sufficient increase in symptoms  improvement in the last week, Recommend Home Heath   Currently out of carvedilol (COREG) 3.125 MG tablet  for function ability  Patient has not had a bowel movement since last Friday.   Please advise

## 2016-11-30 NOTE — Patient Instructions (Addendum)
A few things to remember from today's visit:   Essential hypertension  Unstable gait - Plan: Vitamin B12  Benign paroxysmal positional vertigo, unspecified laterality - Plan: meclizine (ANTIVERT) 25 MG tablet, Vitamin B12   Dizziness is a perception of movement, it is sometimes difficult to describe and can be  caused by different problems, most benign but others can be life threaten.  Vertigo is the most common cause of dizziness, usually related with inner ear and can be associated with nausea, vomiting, and unbalance sensation. It can be complicated by falls due to lose of balance; so fall precautions are very important.  Most of the time dizziness is benign, usually intermittent, last a few seconds at the time and aggravated by certain positions. It usually resolves in a few weeks without residual effect but it could be recurrent.  Sometimes blood work is ordered to evaluate for other possible causes.  Dizziness can also be caused by certain medications, dehydration, migraines, and strokes.  Medication prescribed for vertigo, Meclizine, causes drowsiness/sleepiness, so frequently I recommended taking it at bedtime. I also recommend what we called vestibular exercise, sometimes can be done at home (Modified Semont maneuvers) other times I refer patients to vestibular rehabilitation.   Seek immediate medical attention if: New severe headache, dobble vision, fever (100 F or more), associated numbness/tingling, focal weakness, persistent vomiting, not able to walk, or sudden worsening symptoms.  Please be sure medication list is accurate. If a new problem present, please set up appointment sooner than planned today.

## 2016-11-30 NOTE — Progress Notes (Signed)
ACUTE VISIT   HPI:  Chief Complaint  Patient presents with  . low blood pressure    [name] is a 71 y.o. female, who is here today her daughter-in-law and sister complaining of dizziness for "a while"  I saw pt on 11/02/2016  Dizziness  This is a recurrent problem. The problem occurs intermittently. The problem has been unchanged. Associated symptoms include fatigue, nausea, vertigo and vomiting. Pertinent negatives include no abdominal pain, change in bowel habit, chest pain, chills, congestion, coughing, diaphoresis, fever, headaches, myalgias, rash, sore throat, swollen glands, urinary symptoms or visual change. She has tried nothing for the symptoms.    She denies any prior history of vertigo. She describes dizziness as spinning sensation. This happens usually in the morning when she first wakes up and towards her head to the right. She has history of chronic tinnitus,L>R, stable.  She denies hearing changes.  She has seen ENT, Hx of hearing loss. She saw Dr Jearld Fenton smell an taste disorder on 07/08/16.  She was having headache a few days ago, attributed to "sinus infection", resolved. She is completing Doxycycline, prescribed 11/23/16.    She denies associated chest pain, dyspnea, palpitation, or diaphoresis. Dizziness doesn't happen during the day. She hasn't tried any OTC medication. Daughter-in-law has not noted mental status changes.   According to daughter-in-law, PT offered  therapy for dizziness, next PT visit planned for tomorrow.  She has had some nausea and 2 episodes of vomiting for the past week, attributed to side effects from antibiotics. She denies fever, chills, or body aches. She is eating "fine", still appetitive is not at her baseline.  She has Hx of cholelithiasis, cholecystectomy was cancelled because symptoms resolved.   HTN: Daughter-in-law is also concerned about low BP, 90/62 days ago and 120/80 today morning. She has a BP  monitor at home but has not checked BP daily. Currently she is on Carvedilol 3.125 mg, which was decreased recently to half tablet twice daily. She did decrease dose of Carvedilol last night.  CHF, she denies orthopnea, PND, or edema.  08/18/16 LVEF 30-35% with severe hypokinesis.  She is following low salt diet.  Lab Results  Component Value Date   CREATININE 0.83 11/02/2016   BUN 23 11/02/2016   NA 142 11/02/2016   K 4.1 11/02/2016   CL 106 11/02/2016   CO2 30 11/02/2016    Daughter-in-law is also requesting refills for Thiamine 100 mg daily and folic acid 1 mg daily. According to daughter-in-law, these supplementations were recommended due to malnourished state. She denies any history of alcohol abuse.  She is also requesting B12 lab.  Lab Results  Component Value Date   VITAMINB12 267 08/19/2016   She denies MS changes, numbness, or tingling.  Daughter in low is also inquiring about colonoscopy, she has never had one. Last OV, she reported that colonoscopy was cancelled on 08/03/16 because persistent nausea,vomiting, and ER visits for dehydration/hypoK+.   I have explained that it would be better if she recovers before she re-schedule procedure.    Review of Systems  Constitutional: Positive for fatigue. Negative for activity change, chills, diaphoresis and fever.  HENT: Positive for tinnitus. Negative for congestion, mouth sores, nosebleeds, sinus pain, sore throat and trouble swallowing.   Eyes: Negative for redness and visual disturbance.  Respiratory: Negative for cough, shortness of breath and wheezing.   Cardiovascular: Negative for chest pain, palpitations and leg swelling.  Gastrointestinal: Positive for nausea and vomiting. Negative  for abdominal pain and change in bowel habit.       Negative for changes in bowel habits.  Genitourinary: Negative for decreased urine volume, dysuria and hematuria.  Musculoskeletal: Positive for gait problem. Negative for myalgias.    Skin: Negative for pallor and rash.  Allergic/Immunologic: Positive for environmental allergies.  Neurological: Positive for dizziness and vertigo. Negative for syncope and headaches.  Psychiatric/Behavioral: Negative for confusion. The patient is not nervous/anxious.      Current Outpatient Prescriptions on File Prior to Visit  Medication Sig Dispense Refill  . aspirin EC 81 MG tablet Take 1 tablet (81 mg total) by mouth daily. 30 tablet 0  . atorvastatin (LIPITOR) 20 MG tablet Take 20 mg by mouth daily at 6 PM.    . doxycycline (VIBRAMYCIN) 100 MG capsule Take 1 capsule (100 mg total) by mouth 2 (two) times daily. 14 capsule 0  . polyethylene glycol (MIRALAX / GLYCOLAX) packet Take 17 g by mouth daily. 14 each 0  . potassium chloride (K-DUR) 10 MEQ tablet Take 1 tablet (10 mEq total) by mouth daily. 30 tablet 0   No current facility-administered medications on file prior to visit.      Past Medical History:  Diagnosis Date  . Anxiety   . Cardiomyopathy (HCC) 08/2016   EF 35% with suggestion of anterior wall motion abnormality. Cannot exclude ischemic versus Takotsubo  . Chicken pox   . Chronic kidney disease    polycystic  . Dehydration    recently hosp  . Depression   . History of kidney stones    cyst on kidney  . Hypertension   . Hypokalemia    Allergies  Allergen Reactions  . No Known Allergies     Social History   Social History  . Marital status: Single    Spouse name: N/A  . Number of children: N/A  . Years of education: N/A   Social History Main Topics  . Smoking status: Never Smoker  . Smokeless tobacco: Never Used  . Alcohol use No  . Drug use: No  . Sexual activity: Not Currently   Other Topics Concern  . None   Social History Narrative  . None    Vitals:   11/30/16 1431  BP: 110/68  Pulse: 83  Resp: 12  SpO2: 97%   Body mass index is 17.27 kg/m.   Wt Readings from Last 3 Encounters:  11/30/16 91 lb 6 oz (41.4 kg)  11/24/16 94  lb (42.6 kg)  11/02/16 97 lb 4 oz (44.1 kg)     Physical Exam  Nursing note and vitals reviewed. Constitutional: She is oriented to person, place, and time. She appears well-developed. No distress.  Underweight  HENT:  Head: Normocephalic and atraumatic.  Right Ear: Tympanic membrane, external ear and ear canal normal.  Left Ear: Tympanic membrane, external ear and ear canal normal.  Mouth/Throat: Oropharynx is clear and moist and mucous membranes are normal. Abnormal dentition.  I could not complete Apley maneuver due to spinning sensation.  Eyes: Pupils are equal, round, and reactive to light. Conjunctivae and EOM are normal.  Neck: Carotid bruit is not present.  Cardiovascular: Normal rate and regular rhythm.   No murmur heard. Pulses:      Dorsalis pedis pulses are 2+ on the right side, and 2+ on the left side.  Respiratory: Effort normal and breath sounds normal. No respiratory distress.  GI: Soft. There is no tenderness.  Musculoskeletal: She exhibits no edema.  Lymphadenopathy:  She has no cervical adenopathy.  Neurological: She is alert and oriented to person, place, and time. She has normal strength. No cranial nerve deficit.  Gait assisted by walker.  Skin: Skin is warm. No erythema.  Psychiatric: She has a normal mood and affect.  Appropriately groomed, good eye contact.    ASSESSMENT AND PLAN:   Colleen Davis was seen today for low blood pressure.  Diagnoses and all orders for this visit:  Benign paroxysmal positional vertigo, unspecified laterality  We discussed other possible etiologies of dizziness, Hx and examination today suggest benign vertigo. I do not think further work-up is necessary at this time but needs to be consider if worsening or persient symptoms for more that 2 weeks; in this case she needs to follow with ENT. Explained that problem can be recurrent. Fall prevention. Vestibular exercises recommended, which could be done through  [facility]. Meclizine 25 mg 1/2-1 tab at bedtime prn, side effects discussed. Instructed about warning signs. Keep next F/U appt.   -     meclizine (ANTIVERT) 25 MG tablet; Take 1 tablet (25 mg total) by mouth at bedtime as needed for dizziness. -     Vitamin B12  Essential hypertension  Today BP is better. Continue Carvedilol 3.125 mg 1/2 tab bid. Medication may need to be discontinued if BP persistently < 100/60. Instructed to monitor BP daily. Continue low salt diet. Instructed about warning signs.   Unstable gait  Fall precautions. Continue PT and OT at home.  -     Vitamin B12  Nausea and vomiting, intractability of vomiting not specified, unspecified vomiting type  Possible etiologies discussed. It can be certainly caused by abx, she has 2-3 days left. Also can be related to cholelithiasis. Zofran 4 mg tid as needed. Instructed about warning signs.  Colon cancer screening  She is still recovering from serious illness that required hospitalization dn SNF stay. I recommend holding on procedure for now.  If she still would like to have procedure she can re-schedule. Reviewing records it seems like GI recommended cholecystectomy prior to colonoscopy.   Other orders -     folic acid (FOLVITE) 1 MG tablet; Take 1 tablet (1 mg total) by mouth daily. -     thiamine (VITAMIN B-1) 100 MG tablet; Take 1 tablet (100 mg total) by mouth daily.   B12 ordered today, explained daughter in law that it may not be covered by her insurance.She voices understanding.    -Ms.Coumba Kellison was advised to seek immediate medical attention if sudden worsening symptoms, otherwise she will keep next appt for 2 months f/u arranged after last OV.      Betty G. [place], MD  [facility]. [place] office.

## 2016-11-30 NOTE — Telephone Encounter (Signed)
Called and spoke with patient's daughter. She is wanting to find out if you want patient to continue to take her folic acid & b1, she is out of both medications. Also, she was wanting to know if we could check her B12 today to see if she needs to continue the injections.  Patient has an appointment at 2:30.

## 2016-11-30 NOTE — Telephone Encounter (Signed)
FYI  Patient is coming in today at 2:30.

## 2016-11-30 NOTE — Telephone Encounter (Signed)
I saw her today and she did report changes in bowel movements when I asked. She did report nausea and vomiting, I sent Rx for zofran. She did not have symptoms of obstruction, so she can try prune juice, increase fiber and fluid intake.  If abdominal pain, not able to pass gas, or worsening N/V she needs to go to the ER.  Also she was supposed to have f/u appt from last OV and I did not see arranged appt. Please schedule 2 weeks f/u.  Thanks, BJ

## 2016-12-01 LAB — VITAMIN B12: Vitamin B-12: 644 pg/mL (ref 211–911)

## 2016-12-01 NOTE — Telephone Encounter (Signed)
This was addressed today. She is not on B12 supplementation. B12 is pending.  BJ

## 2016-12-01 NOTE — Progress Notes (Signed)
B12 level is back and in normal range.  Thanks

## 2016-12-01 NOTE — Telephone Encounter (Signed)
LMTCB for patient.

## 2016-12-02 ENCOUNTER — Telehealth: Payer: Self-pay

## 2016-12-02 NOTE — Telephone Encounter (Signed)
Colleen Davis @ [facility] called to report that she is at pt's home this morning and her BP is low. Sitting is 106/70 and standing is 92/62. They would like to know if pt should continue her Carvedilol. She is taking 1/2 tab BID.   Dr. [place] - Please advise. Thanks!

## 2016-12-02 NOTE — Telephone Encounter (Signed)
Pt being seen by [facility] for home health. They have no concerns at this time. Nothing further needed.

## 2016-12-02 NOTE — Telephone Encounter (Signed)
Coreg was just adjusted but it can be decreased now to once daily. Continue monitoring BP. Thanks, BJ

## 2016-12-02 NOTE — Telephone Encounter (Signed)
Per Dr. [place] - change Carvedilol to 1/2 tab QD. Spoke with Patty and advised. Nothing further needed at this time.

## 2016-12-05 ENCOUNTER — Telehealth: Payer: Self-pay | Admitting: Family Medicine

## 2016-12-05 NOTE — Telephone Encounter (Signed)
Colleen Davis with [facility] would like verbal order for home health OT 1 wk /1 2 wk / 2

## 2016-12-05 NOTE — Telephone Encounter (Signed)
Ok to give verbal order as requested. Thanks, BJ

## 2016-12-05 NOTE — Telephone Encounter (Signed)
Verbal okay? 

## 2016-12-06 ENCOUNTER — Telehealth: Payer: Self-pay

## 2016-12-06 NOTE — Telephone Encounter (Signed)
So as we have discussed medication can be weaned off but I prefer not to stop it abruptly, it is why I have been decreasing dose gradually. She can continue 1/2 tab every other days for a week and then stop it. Adequate hydration. Fall prevention. Keep next FU appt.  Thanks, BJ

## 2016-12-06 NOTE — Telephone Encounter (Signed)
Left verbal on Colleen Davis's voicemail.

## 2016-12-06 NOTE — Telephone Encounter (Signed)
Spoke with Patty and advised. Nothing further needed at this time.

## 2016-12-06 NOTE — Telephone Encounter (Signed)
Patty @ [facility] called to report that pt's BP continues to be low. Today sitting was 110/80 and standing was 98/70. Pt has been taking Carvedilol 1/2 tab daily. They would like to know if pt should continue this medication.  Dr. [place] - Please advise. Thanks!

## 2016-12-08 ENCOUNTER — Ambulatory Visit: Payer: Medicare Other

## 2016-12-08 ENCOUNTER — Ambulatory Visit
Admission: RE | Admit: 2016-12-08 | Discharge: 2016-12-08 | Disposition: A | Discharge status: Home / Self Care | Payer: Medicare Other | Source: Ambulatory Visit | Attending: Family Medicine | Admitting: Family Medicine

## 2016-12-08 DIAGNOSIS — N644 Mastodynia: Secondary | ICD-10-CM

## 2017-01-05 ENCOUNTER — Encounter: Payer: Self-pay | Admitting: Family Medicine

## 2017-01-12 DIAGNOSIS — R54 Age-related physical debility: Secondary | ICD-10-CM | POA: Insufficient documentation

## 2017-01-12 NOTE — Progress Notes (Signed)
HPI:   Colleen Davis is a 71 y.o. female, who is here today with her sister to follow on some chronic medical problems.  Hx of CHF with Echo LVEF 30-35% , severe hypokinesis and apex akinesis. Lexiscan stress test 08/2016 EF 55-65%, no evidence of ischemia. She was hospitalized in 08/2016, discharged to SNF and back home with his son and daughter in law on 10/07/16.  HH and PT completed.   She was seen on 11/30/16, then she was c/o dizziness, thought to be vertigo. Vestibular therapy was arrenged through [facility]. Dizziness has improved, usually when in bed and turning right to left. In general she is reporting improvement and no symptoms while she is walking.  She has followed with ENT for hearing loss, Dr Jearld Fenton.   HTN: Antihypertensive medication was weaned off due to hypotension. BP readings: 120-130/70's. 01/26/17 cataract surgery.  Lab Results  Component Value Date   CREATININE 0.83 11/02/2016   BUN 23 11/02/2016   NA 142 11/02/2016   K 4.1 11/02/2016   CL 106 11/02/2016   CO2 30 11/02/2016    Appetite is improving and she has noted wt gain.  She is eating 3 times per day and sometimes snacks. 2 Ensures daily with ice cream.  She is having bowel movements about 3-4 times per wek and takes Miralax as needed.   Concerns today:   Hair loss noted, no alopecia. It was called to her attention during last hair appt. It is getting better with shampoo recommended by her hair dresser. Hair dresser called it to her attention.   Hyperlipidemia:  Currently on Lipitor 20 mg daily. Following a low fat diet: Not at this time..  She has not noted side effects with medication.  Lab Results  Component Value Date   CHOL 118 11/17/2011   HDL 33 (L) 11/17/2011   LDLCALC 70 11/17/2011   TRIG 75 11/17/2011   CHOLHDL 3.6 11/17/2011    HypoK+, she is on K-DUR 10 meq daily. Ca++ has been low at 8.6. No known Hx of vit D deficiency.   Review of Systems    Constitutional: Positive for activity change, appetite change and fatigue (Improving.). Negative for fever.  HENT: Negative for mouth sores, nosebleeds, sore throat and trouble swallowing.   Eyes: Negative for redness and visual disturbance.  Respiratory: Negative for cough, shortness of breath and wheezing.   Cardiovascular: Negative for chest pain, palpitations and leg swelling.  Gastrointestinal: Negative for abdominal pain, nausea and vomiting.       Negative for changes in bowel habits.  Endocrine: Negative for cold intolerance and heat intolerance.  Genitourinary: Negative for decreased urine volume, dysuria and hematuria.  Musculoskeletal: Positive for gait problem. Negative for myalgias.  Skin: Negative for pallor and rash.  Neurological: Positive for dizziness. Negative for syncope, weakness and headaches.  Psychiatric/Behavioral: Negative for confusion. The patient is nervous/anxious.       Current Outpatient Prescriptions on File Prior to Visit  Medication Sig Dispense Refill  . aspirin EC 81 MG tablet Take 1 tablet (81 mg total) by mouth daily. 30 tablet 0  . atorvastatin (LIPITOR) 20 MG tablet Take 20 mg by mouth daily at 6 PM.    . folic acid (FOLVITE) 1 MG tablet Take 1 tablet (1 mg total) by mouth daily. 90 tablet 0  . meclizine (ANTIVERT) 25 MG tablet Take 1 tablet (25 mg total) by mouth at bedtime as needed for dizziness. 30 tablet 0  .  polyethylene glycol (MIRALAX / GLYCOLAX) packet Take 17 g by mouth daily. 14 each 0  . potassium chloride (K-DUR) 10 MEQ tablet Take 1 tablet (10 mEq total) by mouth daily. 30 tablet 0  . thiamine (VITAMIN B-1) 100 MG tablet Take 1 tablet (100 mg total) by mouth daily. 90 tablet 0   No current facility-administered medications on file prior to visit.      Past Medical History:  Diagnosis Date  . Anxiety   . Cardiomyopathy (HCC) 08/2016   EF 35% with suggestion of anterior wall motion abnormality. Cannot exclude ischemic versus  Takotsubo  . Chicken pox   . Chronic kidney disease    polycystic  . Dehydration    recently hosp  . Depression   . History of kidney stones    cyst on kidney  . Hypertension   . Hypokalemia    Allergies  Allergen Reactions  . No Known Allergies     Social History   Social History  . Marital status: Single    Spouse name: N/A  . Number of children: N/A  . Years of education: N/A   Social History Main Topics  . Smoking status: Never Smoker  . Smokeless tobacco: Never Used  . Alcohol use No  . Drug use: No  . Sexual activity: Not Currently   Other Topics Concern  . None   Social History Narrative  . None    Vitals:   01/13/17 1106  BP: 130/80  Pulse: 95  Resp: 16  SpO2: 98%   Body mass index is 19.49 kg/m.   Wt Readings from Last 3 Encounters:  01/13/17 103 lb 2 oz (46.8 kg)  11/30/16 91 lb 6 oz (41.4 kg)  11/24/16 94 lb (42.6 kg)     Physical Exam  Constitutional: She is oriented to person, place, and time. She appears well-developed. No distress.  HENT:  Head: Atraumatic.  Mouth/Throat: Oropharynx is clear and moist and mucous membranes are normal.  Poor dentition  Eyes: Pupils are equal, round, and reactive to light. Conjunctivae and EOM are normal.  Neck: No tracheal deviation present. No thyroid mass and no thyromegaly present.  Cardiovascular: Normal rate and regular rhythm.   No murmur heard. Pulses:      Dorsalis pedis pulses are 2+ on the right side, and 2+ on the left side.  Respiratory: Effort normal and breath sounds normal. No respiratory distress.  GI: Soft. She exhibits no mass. There is no tenderness.  Musculoskeletal: She exhibits no edema or tenderness.  Lymphadenopathy:    She has no cervical adenopathy.  Neurological: She is alert and oriented to person, place, and time. She has normal strength. Coordination normal.  Mildly unstable gait, able to walk with no assistance.  Skin: Skin is warm. No erythema.  Psychiatric: She  has a normal mood and affect.  Well groomed, good eye contact.     ASSESSMENT AND PLAN:    [name] was seen today for follow-up.  Diagnoses and all orders for this visit:  Unstable gait  Improved with PT. Fall precautions discussed.  Also gaining wt, about 12 Lb sine her last OV in 11/2016.  F/U in 4 months.  Vertigo  Improved, chronic. Continue Meclizine 25 mg at bedtime if needed. Some side effects discussed. She does not feel like ENT f/u is needed.  Hyperlipidemia with target LDL less than 70  She is not fasting today, will come for fasting labs next week. No changes in Lipitor dose. Recommend  low fat diet, recommend stopping ice cream.  -     Lipid panel; Future  Hypokalemia  No changes in current management, will follow lab and will give further recommendations accordingly.  -     Potassium; Future  Telogen hair loss  No alopecic areas appreciated. We discussed possible causes, most likely telogen hair loss. Continue OTC shampoo. F/U as needed.  Hypocalcemia -     VITAMIN D 25 Hydroxy (Vit-D Deficiency, Fractures); Future  Need for influenza vaccination -     Flu vaccine HIGH DOSE PF   She was instructed to re-schedule appt with GI and discuss colonoscopy.    -Ms. Konner Shores was advised to return sooner than planned today if new concerns arise.       Jo Booze G. [place], MD  [facility]. [place] office.

## 2017-01-13 ENCOUNTER — Ambulatory Visit (INDEPENDENT_AMBULATORY_CARE_PROVIDER_SITE_OTHER): Payer: Medicare Other | Admitting: Family Medicine

## 2017-01-13 ENCOUNTER — Encounter: Payer: Self-pay | Admitting: Family Medicine

## 2017-01-13 VITALS — BP 130/80 | HR 95 | Resp 16 | Ht 61.0 in | Wt 103.1 lb

## 2017-01-13 DIAGNOSIS — E876 Hypokalemia: Secondary | ICD-10-CM | POA: Diagnosis not present

## 2017-01-13 DIAGNOSIS — Z23 Encounter for immunization: Secondary | ICD-10-CM | POA: Diagnosis not present

## 2017-01-13 DIAGNOSIS — E785 Hyperlipidemia, unspecified: Secondary | ICD-10-CM | POA: Diagnosis not present

## 2017-01-13 DIAGNOSIS — L650 Telogen effluvium: Secondary | ICD-10-CM

## 2017-01-13 DIAGNOSIS — R2681 Unsteadiness on feet: Secondary | ICD-10-CM | POA: Diagnosis not present

## 2017-01-13 DIAGNOSIS — R42 Dizziness and giddiness: Secondary | ICD-10-CM | POA: Diagnosis not present

## 2017-01-13 NOTE — Patient Instructions (Signed)
A few things to remember from today's visit:   Unstable gait  Frailty syndrome in geriatric patient  Vertigo  Need for influenza vaccination - Plan: Flu vaccine HIGH DOSE PF  Hypocalcemia - Plan: VITAMIN D 25 Hydroxy (Vit-D Deficiency, Fractures)  Hyperlipidemia with target LDL less than 70 - Plan: Lipid panel  Hypokalemia - Plan: Potassium  Labs next week,fasting.  Schedule colonoscopy.  Fall precautions.  Please be sure medication list is accurate. If a new problem present, please set up appointment sooner than planned today.

## 2017-01-17 ENCOUNTER — Encounter: Payer: Self-pay | Admitting: Family Medicine

## 2017-01-17 ENCOUNTER — Other Ambulatory Visit (INDEPENDENT_AMBULATORY_CARE_PROVIDER_SITE_OTHER): Payer: Medicare Other

## 2017-01-17 DIAGNOSIS — E876 Hypokalemia: Secondary | ICD-10-CM

## 2017-01-17 DIAGNOSIS — E785 Hyperlipidemia, unspecified: Secondary | ICD-10-CM | POA: Diagnosis not present

## 2017-01-17 LAB — VITAMIN D 25 HYDROXY (VIT D DEFICIENCY, FRACTURES): VITD: 25.72 ng/mL — AB (ref 30.00–100.00)

## 2017-01-17 LAB — LIPID PANEL
CHOL/HDL RATIO: 3
CHOLESTEROL: 156 mg/dL (ref 0–200)
HDL: 53.3 mg/dL (ref >39.00)
LDL CALC: 88 mg/dL (ref 0–99)
NonHDL: 102.78
Triglycerides: 74 mg/dL (ref 0.0–149.0)
VLDL: 14.8 mg/dL (ref 0.0–40.0)

## 2017-01-17 LAB — POTASSIUM: POTASSIUM: 4.4 meq/L (ref 3.5–5.1)

## 2017-01-20 ENCOUNTER — Encounter: Payer: Self-pay | Admitting: Family Medicine

## 2017-01-24 ENCOUNTER — Encounter: Payer: Self-pay | Admitting: Family Medicine

## 2017-01-24 ENCOUNTER — Ambulatory Visit (INDEPENDENT_AMBULATORY_CARE_PROVIDER_SITE_OTHER): Payer: Medicare Other | Admitting: Family Medicine

## 2017-01-24 VITALS — BP 126/80 | HR 96 | Resp 16 | Ht 61.0 in | Wt 107.0 lb

## 2017-01-24 DIAGNOSIS — E876 Hypokalemia: Secondary | ICD-10-CM

## 2017-01-24 DIAGNOSIS — M545 Low back pain, unspecified: Secondary | ICD-10-CM

## 2017-01-24 NOTE — Patient Instructions (Signed)
A few things to remember from today's visit:   Bilateral low back pain without sciatica, unspecified chronicity  Because risk of falls I do not want to start a muscle relaxant.  Over the counter Icy hot path or asper cream with lidocaine may help. Tylenol 500 mg 4 times per day. Stretching exercises may also help.  Avoid activities that aggravate pain.  If worse we may need imaging.    Back pain is very common in adults.The cause of back pain is rarely dangerous and the pain often gets better over time even with no pharmacologic treatment.  The cause of your back pain may not be known. Some common causes of back pain include: 1. Strain of the muscles or ligaments supporting the spine. 2. Wear and tear (degeneration) of the spinal disks. 3. Arthritis. 4. Direct injury to the back.  For many people, back pain may return. Since back pain is rarely dangerous, most people can learn to manage this condition on their own.  HOME CARE INSTRUCTIONS Watch your back pain for any changes. The following actions may help to lessen any discomfort you are feeling:  1. Remain active. It is stressful on your back to sit or stand in one place for long periods of time. Do not sit, drive, or stand in one place for more than 30 minutes at a time. Take short walks on even surfaces as soon as you are able.Try to increase the length of time you walk each day.  2. Exercise regularly as directed by your health care provider. Exercise helps your back heal faster. It also helps avoid future injury by keeping your muscles strong and flexible.  3. Do not stay in bed.Resting more than 1-2 days can delay your recovery.                                                      4. Pay attention to your body when you bend and lift. The most comfortable positions are those that put less stress on your recovering back.  5.  Always use proper lifting techniques, including: Bending your knees. Keeping the load close  to your body. Avoiding twisting.  6. Find a comfortable position to sleep. Use a firm mattress and lie on your side with your knees slightly bent. If you lie on your back, put a pillow under your knees.  7. Over the counter rubbing medications like Icy Hot or Asper cream with Lidocaine may help without significant side effects.  Acetaminophen and/or Aleve/Ibuprofen can be taken if needed and if not contraindications. Local ice and heat may be alternated to reduce pain and spasms. Also massage and even chiropractor treatment.      Muscle relaxants might or might not help, they cause drowsiness among other    side effects. They could also interact with some of medications you may be already taking (medications for depression/anxiety and some pain medications).   8. Maintain a healthy weight. Excess weight puts extra stress on your back and makes it difficult to maintain good posture.   SEEK MEDICAL CARE IF: worsening pain, associated fever, rash/edema on area, pain going to legs or buttocks, numbness/tingling, night pain, or abnormal weight loss.    SEEK IMMEDIATE MEDICAL CARE IF:  1. You develop new bowel or bladder control problems. 2. You have unusual weakness  or numbness in your arms or legs. 3. You develop nausea or vomiting. 4. You develop abdominal pain. 5. You feel faint.     Back Exercises The following exercises strengthen the muscles that help to support the back. They also help to keep the lower back flexible. Doing these exercises can help to prevent back pain or lessen existing pain. If you have back pain or discomfort, try doing these exercises 2-3 times each day or as told by your health care provider. When the pain goes away, do them once each day, but increase the number of times that you repeat the steps for each exercise (do more repetitions). If you do not have back pain or discomfort, do these exercises once each day or as told by your health care  provider.   EXERCISES Single Knee to Chest Repeat these steps 3-5 times for each leg: 5. Lie on your back on a firm bed or the floor with your legs extended. 6. Bring one knee to your chest. Your other leg should stay extended and in contact with the floor. 7. Hold your knee in place by grabbing your knee or thigh. 8. Pull on your knee until you feel a gentle stretch in your lower back. 9. Hold the stretch for 10-30 seconds. 10. Slowly release and straighten your leg.  Pelvic Tilt Repeat these steps 5-10 times: 2. Lie on your back on a firm bed or the floor with your legs extended. 3. Bend your knees so they are pointing toward the ceiling and your feet are flat on the floor. 4. Tighten your lower abdominal muscles to press your lower back against the floor. This motion will tilt your pelvis so your tailbone points up toward the ceiling instead of pointing to your feet or the floor. 5. With gentle tension and even breathing, hold this position for 5-10 seconds.  Cat-Cow Repeat these steps until your lower back becomes more flexible: 1. Get into a hands-and-knees position on a firm surface. Keep your hands under your shoulders, and keep your knees under your hips. You may place padding under your knees for comfort. 2. Let your head hang down, and point your tailbone toward the floor so your lower back becomes rounded like the back of a cat. 3. Hold this position for 5 seconds. 4. Slowly lift your head and point your tailbone up toward the ceiling so your back forms a sagging arch like the back of a cow. 5. Hold this position for 5 seconds.   Press-Ups Repeat these steps 5-10 times: 6. Lie on your abdomen (face-down) on the floor. 7. Place your palms near your head, about shoulder-width apart. 8. While you keep your back as relaxed as possible and keep your hips on the floor, slowly straighten your arms to raise the top half of your body and lift your shoulders. Do not use your back  muscles to raise your upper torso. You may adjust the placement of your hands to make yourself more comfortable. 9. Hold this position for 5 seconds while you keep your back relaxed. 10. Slowly return to lying flat on the floor.   Bridges Repeat these steps 10 times: 1. Lie on your back on a firm surface. 2. Bend your knees so they are pointing toward the ceiling and your feet are flat on the floor. 3. Tighten your buttocks muscles and lift your buttocks off of the floor until your waist is at almost the same height as your knees. You should feel  the muscles working in your buttocks and the back of your thighs. If you do not feel these muscles, slide your feet 1-2 inches farther away from your buttocks. 4. Hold this position for 3-5 seconds. 5. Slowly lower your hips to the starting position, and allow your buttocks muscles to relax completely. If this exercise is too easy, try doing it with your arms crossed over your chest.    Please be sure medication list is accurate. If a new problem present, please set up appointment sooner than planned today.

## 2017-01-24 NOTE — Progress Notes (Signed)
ACUTE VISIT   HPI:  Chief Complaint  Patient presents with  . Back Pain    Colleen Davis is a 71 y.o. female, who is here today with her sister complaining of a day of side pain, bilateral.   She denies any recent injury. She mentions that she stayed with her sister this weekend and had to walk stairs up and down, 15 total. She did not have pain while she was doing so.   Pain is not radiated, pulling like,it was max 8/10 in intensity when she move. Denies associated LE numbness, tingling, urinary incontinence or retention, stool incontinence, or saddle anesthesia. Pain greatly improved after 2 pm today, no pain at this time.  Exacerbated by "twisting" back. Alleviated by rest. No rash or edema on area, fever, chills, or abnormal wt loss.  Prior Hx of back pain: Deneis   OTC medications: Tylenol today Am and last night.  She denies associated  fever, chills, abdominal pain, changes in bowel habits, nausea, vomiting, urinary symptoms, or skin rash.  HypoK+: She was also concerned about K+ supplementation. She is currently on KDUR 10 mEq daily, she wonders if she needs to continue. K+ was 5.2 4 months ago and in normal range in 01/2017.  Lab Results  Component Value Date   CREATININE 0.83 11/02/2016   BUN 23 11/02/2016   NA 142 11/02/2016   K 4.4 01/17/2017   CL 106 11/02/2016   CO2 30 11/02/2016     Review of Systems  Constitutional: Positive for fatigue (no more than usual). Negative for appetite change and fever.  HENT: Negative for mouth sores, sore throat and trouble swallowing.   Respiratory: Negative for cough, shortness of breath and wheezing.   Cardiovascular: Negative for leg swelling.  Gastrointestinal: Negative for abdominal pain, nausea and vomiting.       Negative for changes in bowel habits or fecal incontinence.  Genitourinary: Negative for decreased urine volume, dysuria and hematuria.  Musculoskeletal: Positive for back pain and  gait problem (stable). Negative for neck pain.  Skin: Negative for rash.  Neurological: Negative for weakness, numbness and headaches.  Psychiatric/Behavioral: Negative for confusion. The patient is nervous/anxious.      Current Outpatient Prescriptions on File Prior to Visit  Medication Sig Dispense Refill  . aspirin EC 81 MG tablet Take 1 tablet (81 mg total) by mouth daily. 30 tablet 0  . atorvastatin (LIPITOR) 20 MG tablet Take 20 mg by mouth daily at 6 PM.    . folic acid (FOLVITE) 1 MG tablet Take 1 tablet (1 mg total) by mouth daily. 90 tablet 0  . meclizine (ANTIVERT) 25 MG tablet Take 1 tablet (25 mg total) by mouth at bedtime as needed for dizziness. 30 tablet 0  . polyethylene glycol (MIRALAX / GLYCOLAX) packet Take 17 g by mouth daily. 14 each 0  . thiamine (VITAMIN B-1) 100 MG tablet Take 1 tablet (100 mg total) by mouth daily. 90 tablet 0   No current facility-administered medications on file prior to visit.      Past Medical History:  Diagnosis Date  . Anxiety   . Cardiomyopathy (HCC) 08/2016   EF 35% with suggestion of anterior wall motion abnormality. Cannot exclude ischemic versus Takotsubo  . Chicken pox   . Chronic kidney disease    polycystic  . Dehydration    recently hosp  . Depression   . History of kidney stones    cyst on kidney  .  Hypertension   . Hypokalemia    Allergies  Allergen Reactions  . No Known Allergies     Social History   Social History  . Marital status: Single    Spouse name: N/A  . Number of children: N/A  . Years of education: N/A   Social History Main Topics  . Smoking status: Never Smoker  . Smokeless tobacco: Never Used  . Alcohol use No  . Drug use: No  . Sexual activity: Not Currently   Other Topics Concern  . None   Social History Narrative  . None    Vitals:   01/24/17 1537  BP: 126/80  Pulse: 96  Resp: 16  SpO2: 98%   Body mass index is 20.22 kg/m.   Physical Exam  Nursing note and vitals  reviewed. Constitutional: She is oriented to person, place, and time. She appears well-developed and well-nourished. She does not appear ill. No distress.  HENT:  Head: Normocephalic and atraumatic.  Poor dentition.  Eyes: Conjunctivae are normal.  Cardiovascular: Normal rate and regular rhythm.   No murmur heard. DP pulses present.  Respiratory: Effort normal and breath sounds normal. No respiratory distress.  GI: Soft. She exhibits no mass. There is no tenderness.  Musculoskeletal: She exhibits no edema.       Thoracic back: She exhibits tenderness. She exhibits no bony tenderness.       Lumbar back: She exhibits tenderness. She exhibits no bony tenderness.  Kyphosis. mild tenderness upon palpation of thoracic paraspinal muscles, bilateral (left: from interscapular down lumbar and right lumbar). Getting up from chair elicits right lower back pain.  Neurological: She is alert and oriented to person, place, and time. She has normal strength.  SLR negative bilateral. Patellar DTR's 1+-2+ symmetric. Stable gait with no assistance.  Skin: Skin is warm. No rash noted. No erythema.  Psychiatric: She has a normal mood and affect.  Well groomed, good eye contact.    ASSESSMENT AND PLAN:   Colleen Davis was seen today for back pain.  Diagnoses and all orders for this visit:  Bilateral low back pain without sciatica, unspecified chronicity  It seems to be musculoskeletal, ? Muscle strain. Improved. Based on examination today and because no history of direct injury/trauma, I don't think imaging is needed at this time. She can continue Tylenol 500 mg 4 times a day and OTC topical treatments as icy hot or Aspercreme. Instructed to avoid activities that could aggravate pain. She was clearly instructed about warning signs. Fall precautions discussed.  Hypokalemia  For now she can discontinue KDUR and continue K+ rich diet. We will probably check next visit.    -Colleen Davis  was advised to seek immediate medical attention if sudden worsening symptoms or to follow if they persist or if new concerns arise.       Betty G. [place], MD  [facility]. [place] office.

## 2017-03-27 ENCOUNTER — Ambulatory Visit: Payer: Medicare Other | Admitting: Family Medicine

## 2017-05-25 ENCOUNTER — Telehealth: Payer: Self-pay | Admitting: Family Medicine

## 2017-05-25 NOTE — Telephone Encounter (Signed)
Copied from CRM [id number]. Topic: Quick Communication - Rx Refill/Question >> May 25, 2017 10:20 AM Stephannie Li, NT wrote: Medication: atorvastatin 20 mg Has the patient contacted their pharmacy? { no (Agent: If no, request that the patient contact the pharmacy for the refill.) Preferred Pharmacy (with phone number or street name):[facility] on [address] [phone] Agent: Please be advised that RX refills may take up to 3 business days. We ask that you follow-up with your pharmacy.

## 2017-05-25 NOTE — Telephone Encounter (Signed)
Medication review:  Atorvastatin 20 mg- listed as historical- provider name not on Rx although labs are current  LOV: 01/24/17  (lab 01/17/17)  Pharmacy: verified

## 2017-05-26 ENCOUNTER — Other Ambulatory Visit: Payer: Self-pay | Admitting: *Deleted

## 2017-05-26 MED ORDER — ATORVASTATIN CALCIUM 20 MG PO TABS: 20.0000 mg | ORAL_TABLET | Freq: Every day | ORAL | 2 refills | Status: DC

## 2017-05-26 NOTE — Telephone Encounter (Signed)
Rx sent to pharmacy   

## 2017-06-26 ENCOUNTER — Emergency Department (HOSPITAL_BASED_OUTPATIENT_CLINIC_OR_DEPARTMENT_OTHER)
Admission: EM | Admit: 2017-06-26 | Discharge: 2017-06-26 | Disposition: A | Discharge status: Home / Self Care | Payer: Medicare Other | Attending: Emergency Medicine | Admitting: Emergency Medicine

## 2017-06-26 ENCOUNTER — Other Ambulatory Visit: Payer: Self-pay

## 2017-06-26 ENCOUNTER — Emergency Department (HOSPITAL_BASED_OUTPATIENT_CLINIC_OR_DEPARTMENT_OTHER): Payer: Medicare Other

## 2017-06-26 ENCOUNTER — Ambulatory Visit: Payer: Self-pay | Admitting: *Deleted

## 2017-06-26 ENCOUNTER — Encounter (HOSPITAL_BASED_OUTPATIENT_CLINIC_OR_DEPARTMENT_OTHER): Payer: Self-pay | Admitting: *Deleted

## 2017-06-26 DIAGNOSIS — R05 Cough: Secondary | ICD-10-CM | POA: Diagnosis present

## 2017-06-26 DIAGNOSIS — Z7982 Long term (current) use of aspirin: Secondary | ICD-10-CM | POA: Diagnosis not present

## 2017-06-26 DIAGNOSIS — I428 Other cardiomyopathies: Secondary | ICD-10-CM | POA: Insufficient documentation

## 2017-06-26 DIAGNOSIS — I10 Essential (primary) hypertension: Secondary | ICD-10-CM | POA: Diagnosis not present

## 2017-06-26 DIAGNOSIS — Z79899 Other long term (current) drug therapy: Secondary | ICD-10-CM | POA: Diagnosis not present

## 2017-06-26 DIAGNOSIS — J40 Bronchitis, not specified as acute or chronic: Secondary | ICD-10-CM | POA: Insufficient documentation

## 2017-06-26 LAB — BASIC METABOLIC PANEL
Anion gap: 9 (ref 5–15)
BUN: 18 mg/dL (ref 6–20)
CALCIUM: 8.9 mg/dL (ref 8.9–10.3)
CHLORIDE: 104 mmol/L (ref 101–111)
CO2: 24 mmol/L (ref 22–32)
CREATININE: 0.99 mg/dL (ref 0.44–1.00)
GFR calc Af Amer: >60 mL/min (ref >60)
GFR calc non Af Amer: 56 mL/min — ABNORMAL LOW (ref >60)
Glucose, Bld: 120 mg/dL — ABNORMAL HIGH (ref 65–99)
Potassium: 3.1 mmol/L — ABNORMAL LOW (ref 3.5–5.1)
SODIUM: 137 mmol/L (ref 135–145)

## 2017-06-26 LAB — CBC WITH DIFFERENTIAL/PLATELET
Basophils Absolute: 0 10*3/uL (ref 0.0–0.1)
Basophils Relative: 0 %
EOS ABS: 0 10*3/uL (ref 0.0–0.7)
EOS PCT: 0 %
HCT: 35.3 % — ABNORMAL LOW (ref 36.0–46.0)
Hemoglobin: 11.7 g/dL — ABNORMAL LOW (ref 12.0–15.0)
Lymphocytes Relative: 6 %
Lymphs Abs: 0.9 10*3/uL (ref 0.7–4.0)
MCH: 31.1 pg (ref 26.0–34.0)
MCHC: 33.1 g/dL (ref 30.0–36.0)
MCV: 93.9 fL (ref 78.0–100.0)
MONOS PCT: 5 %
Monocytes Absolute: 0.7 10*3/uL (ref 0.1–1.0)
Neutro Abs: 11.6 10*3/uL — ABNORMAL HIGH (ref 1.7–7.7)
Neutrophils Relative %: 89 %
PLATELETS: 208 10*3/uL (ref 150–400)
RBC: 3.76 MIL/uL — ABNORMAL LOW (ref 3.87–5.11)
RDW: 14.1 % (ref 11.5–15.5)
WBC: 13.2 10*3/uL — AB (ref 4.0–10.5)

## 2017-06-26 LAB — URINALYSIS, MICROSCOPIC (REFLEX)

## 2017-06-26 LAB — URINALYSIS, ROUTINE W REFLEX MICROSCOPIC
BILIRUBIN URINE: NEGATIVE
Glucose, UA: NEGATIVE mg/dL
HGB URINE DIPSTICK: NEGATIVE
KETONES UR: NEGATIVE mg/dL
Leukocytes, UA: NEGATIVE
Nitrite: NEGATIVE
PROTEIN: 30 mg/dL — AB
Specific Gravity, Urine: 1.02 (ref 1.005–1.030)
pH: 7 (ref 5.0–8.0)

## 2017-06-26 MED ORDER — BENZONATATE 100 MG PO CAPS: 100.0000 mg | ORAL_CAPSULE | Freq: Three times a day (TID) | ORAL | 0 refills | Status: DC

## 2017-06-26 MED ORDER — POTASSIUM CHLORIDE CRYS ER 20 MEQ PO TBCR
40.0000 meq | EXTENDED_RELEASE_TABLET | Freq: Once | ORAL | Status: AC
  Administered 2017-06-26: 40 meq via ORAL
  Filled 2017-06-26: qty 2

## 2017-06-26 MED ORDER — SODIUM CHLORIDE 0.9 % IV BOLUS (SEPSIS): 500.0000 mL | Freq: Once | INTRAVENOUS | Status: DC

## 2017-06-26 NOTE — ED Notes (Signed)
Pt provided inadequate amount of urine for urine sample. Pt aware of the need for more. Water given per request.

## 2017-06-26 NOTE — Telephone Encounter (Signed)
I agree with recommendations given by triage nurse. "Severe weakness" raises a concern for CVA, so she really should have followed recommendations given about 1-2 hours ago.  Thanks, BJ

## 2017-06-26 NOTE — Telephone Encounter (Signed)
Spoke with patient's sister, gave her instructions per Dr. [place]. Sister stated that they were currently at the ER.

## 2017-06-26 NOTE — ED Provider Notes (Signed)
[facility] EMERGENCY DEPARTMENT Provider Note   CSN: [id number] Arrival date & time: 06/26/17  1359     History   Chief Complaint Chief Complaint  Patient presents with  . Cough  . Weakness    HPI Colleen Davis is a 72 y.o. female.  Patient is a 72 year old female who presents with cough and weakness.  She has a history of hypertension, cardiomyopathy, anxiety and chronic kidney disease.  She states she has had a cough for about 3 days.  Occasionally is productive of green sputum.  She denies any fevers.  No myalgias.  She has a little bit of rhinorrhea.  No nausea or vomiting.  No shortness of breath.  No chest pain or palpitations.  She states this morning she was trying to get up out of her chair and felt weak all over and was having a hard time standing up.  She denies any dizziness other than she has chronic vertigo and reports some intermittent dizziness related to that.  This appears to be unchanged from her baseline dizziness.  She states however since that time she has been able to ambulate without difficulty and feels more back to normal.  She states she had a similar episode after a sinus infection one time where she was weak all over.  She denies any vision changes or speech deficits.  No unilateral weakness or numbness.      Past Medical History:  Diagnosis Date  . Anxiety   . Cardiomyopathy (HCC) 08/2016   EF 35% with suggestion of anterior wall motion abnormality. Cannot exclude ischemic versus Takotsubo  . Chicken pox   . Chronic kidney disease    polycystic  . Dehydration    recently hosp  . Depression   . History of kidney stones    cyst on kidney  . Hypertension   . Hypokalemia     Patient Active Problem List   Diagnosis Date Noted  . Frailty syndrome in geriatric patient 01/12/2017  . Cachexia (HCC) 11/02/2016  . Unstable gait 11/02/2016  . Preoperative cardiovascular examination 09/07/2016  . Takotsubo cardiomyopathy - resolved  09/07/2016  . Abnormal electrocardiogram (ECG) (EKG) 09/07/2016  . Hypotension due to drugs 09/07/2016  . Hypokalemia 08/17/2016  . Symptomatic cholelithiasis 08/17/2016  . Abnormal urinalysis 08/17/2016  . Mild concentric left ventricular hypertrophy (LVH) 08/17/2016  . Severe protein-calorie malnutrition (HCC) 08/17/2016  . Visual hallucinations 08/17/2016  . Syncope and collapse 11/17/2011  . HTN (hypertension) 11/17/2011  . Tinnitus of right ear 11/17/2011  . Hyperlipidemia with target LDL less than 70 11/17/2011    Past Surgical History:  Procedure Laterality Date  . DILATION AND CURETTAGE OF UTERUS    . NM MYOVIEW LTD  09/14/2016   Normal EF 55-65%. Normal wall motion. No evidence of ischemia or infarction.  . TRANSTHORACIC ECHOCARDIOGRAM  08/18/2016   ** in setting of sepsis/cholecystitis: EF 30-35% with severe hypokinesis of the distal septal, distal inferior, mid/distal anterior, mid/distal anterolateral walls and Akinesis of the apex. The cavity size was normal. Wall thickness was normal. Trivial Aortic regurgitation. Mitral Mild MR  . TUBAL LIGATION      OB History    No data available       Home Medications    Prior to Admission medications   Medication Sig Start Date End Date Taking? Authorizing Provider  aspirin EC 81 MG tablet Take 1 tablet (81 mg total) by mouth daily. 08/25/16   Rolly Salter, MD  atorvastatin (LIPITOR)  20 MG tablet Take 1 tablet (20 mg total) by mouth daily at 6 PM. 05/26/17   [place], Betty G, MD  benzonatate (TESSALON) 100 MG capsule Take 1 capsule (100 mg total) by mouth every 8 (eight) hours. 06/26/17   Rolan Bucco, MD  folic acid (FOLVITE) 1 MG tablet Take 1 tablet (1 mg total) by mouth daily. 11/30/16   [place], Betty G, MD  meclizine (ANTIVERT) 25 MG tablet Take 1 tablet (25 mg total) by mouth at bedtime as needed for dizziness. 11/30/16   [place], Betty G, MD  polyethylene glycol [facility] Ethelene Hal) packet Take 17 g by mouth daily.  08/25/16   Rolly Salter, MD  thiamine (VITAMIN B-1) 100 MG tablet Take 1 tablet (100 mg total) by mouth daily. 11/30/16   [place], Betty G, MD    Family History Family History  Problem Relation Age of Onset  . Mental illness Mother        "Dementia"  . Hyperlipidemia Mother   . Lung cancer Father   . Hypertension Father   . Hypertension Sister   . Other Sister        non-alcohol fatty disease  . Colon cancer Sister     Social History Social History   Tobacco Use  . Smoking status: Never Smoker  . Smokeless tobacco: Never Used  Substance Use Topics  . Alcohol use: No  . Drug use: No     Allergies   No known allergies   Review of Systems Review of Systems  Constitutional: Positive for fatigue. Negative for chills, diaphoresis and fever.  HENT: Negative for congestion, rhinorrhea and sneezing.   Eyes: Negative.   Respiratory: Positive for cough. Negative for chest tightness and shortness of breath.   Cardiovascular: Negative for chest pain and leg swelling.  Gastrointestinal: Negative for abdominal pain, blood in stool, diarrhea, nausea and vomiting.  Genitourinary: Negative for difficulty urinating, flank pain, frequency and hematuria.  Musculoskeletal: Negative for arthralgias and back pain.  Skin: Negative for rash.  Neurological: Positive for weakness (Generalized). Negative for dizziness, speech difficulty, numbness and headaches.     Physical Exam Updated Vital Signs BP (!) 143/97 (BP Location: Right Arm)   Pulse 74   Temp 98.4 F (36.9 C) (Oral)   Resp 18   Ht 5\' 1"  (1.549 m)   Wt 56.7 kg (125 lb)   SpO2 96%   BMI 23.62 kg/m   Physical Exam  Constitutional: She is oriented to person, place, and time. She appears well-developed and well-nourished.  HENT:  Head: Normocephalic and atraumatic.  Eyes: Pupils are equal, round, and reactive to light.  Neck: Normal range of motion. Neck supple.  Cardiovascular: Normal rate, regular rhythm and normal  heart sounds.  Pulmonary/Chest: Effort normal and breath sounds normal. No respiratory distress. She has no wheezes. She has no rales. She exhibits no tenderness.  Abdominal: Soft. Bowel sounds are normal. There is no tenderness. There is no rebound and no guarding.  Musculoskeletal: Normal range of motion. She exhibits no edema.  Lymphadenopathy:    She has no cervical adenopathy.  Neurological: She is alert and oriented to person, place, and time.  Motor 5/5 all extremities Sensation grossly intact to LT all extremities  no pronator drift CN II-XII grossly intact    Skin: Skin is warm and dry. No rash noted.  Psychiatric: She has a normal mood and affect.     ED Treatments / Results  Labs (all labs ordered are listed, but  only abnormal results are displayed) Labs Reviewed  BASIC METABOLIC PANEL - Abnormal; Notable for the following components:      Result Value   Potassium 3.1 (*)    Glucose, Bld 120 (*)    GFR calc non Af Amer 56 (*)    All other components within normal limits  CBC WITH DIFFERENTIAL/PLATELET - Abnormal; Notable for the following components:   WBC 13.2 (*)    RBC 3.76 (*)    Hemoglobin 11.7 (*)    HCT 35.3 (*)    Neutro Abs 11.6 (*)    All other components within normal limits  URINALYSIS, ROUTINE W REFLEX MICROSCOPIC - Abnormal; Notable for the following components:   Protein, ur 30 (*)    All other components within normal limits  URINALYSIS, MICROSCOPIC (REFLEX) - Abnormal; Notable for the following components:   Bacteria, UA RARE (*)    Squamous Epithelial / LPF 0-5 (*)    All other components within normal limits    EKG  EKG Interpretation None       Radiology Dg Chest 2 View  Result Date: 06/26/2017 CLINICAL DATA:  Productive cough for 3 days, severe weakness today. EXAM: CHEST - 2 VIEW COMPARISON:  Chest x-ray dated 09/06/2016 FINDINGS: The heart size and mediastinal contours are within normal limits. Both lungs are clear. The  visualized skeletal structures are unremarkable. IMPRESSION: No active cardiopulmonary disease. No evidence of pneumonia or pulmonary edema. Electronically Signed   By: Bary Richard M.D.   On: 06/26/2017 14:41    Procedures Procedures (including critical care time)  Medications Ordered in ED Medications  potassium chloride SA (K-DUR,KLOR-CON) CR tablet 40 mEq (40 mEq Oral Given 06/26/17 1700)     Initial Impression / Assessment and Plan / ED Course  I have reviewed the triage vital signs and the nursing notes.  Pertinent labs & imaging results that were available during my care of the patient were reviewed by me and considered in my medical decision making (see chart for details).     Patient is a 78-year-old female who presents with a cough.  She had one episode of weakness earlier today which seemed to be generalized.  There is no unilateral weakness or other suggestions of a stroke.  No ataxia.  She is been feeling okay since that time.  She has no evidence of pneumonia on x-ray.  Her labs show mildly low potassium and she was given a dose of potassium in the emergency department.  Her white count was  elevated at 13,000 but she has no fever or other infectious concerns..  She has no other concerning findings on her labs.  Her urinalysis does not appear to be infected.  She is able to ambulate without symptoms.  She was discharged home in good condition.  She was started on [employer].  I do not feel this point that she needs antibiotics.  She was advised to follow-up with her PCP if her symptoms are not improving or return here as needed for any worsening symptoms.  Final Clinical Impressions(s) / ED Diagnoses   Final diagnoses:  Bronchitis    ED Discharge Orders        Ordered    benzonatate (TESSALON) 100 MG capsule  Every 8 hours     06/26/17 1826       Rolan Bucco, MD 06/26/17 1828

## 2017-06-26 NOTE — ED Triage Notes (Signed)
Cough with green sputum x 3 days. States she is weak. She is ambulatory with family.

## 2017-06-26 NOTE — Telephone Encounter (Signed)
Pt called stating that she is not able to to get of of her couch; she uses a walker but every time she tries to get up she falls back down on the couch; pt also states that she has cold and flu symptoms and her family has been sick also; nurse triage inititiated and recommendations made to include calling 911; pt declines and would like to seen in the office; she states that her sister will get her into a wheelchair and bring her in; additionally she would like to be called back on her sister's cell phone [phone]; will route to office for notification of this encounter.       Reason for Disposition . [1] SEVERE weakness (i.e., unable to walk or barely able to walk, requires support) AND     [2] new onset or worsening  Answer Assessment - Initial Assessment Questions 1. DESCRIPTION: "Describe how you are feeling."     weak 2. SEVERITY: "How bad is it?"  "Can you stand and walk?"   - MILD - Feels weak or tired, but does not interfere with work, school or normal activities   - MODERATE - Able to stand and walk; weakness interferes with work, school, or normal activities   - SEVERE - Unable to stand or walk     severe 3. ONSET:  "When did the weakness begin?"     Woke up like this morning 4. CAUSE: "What do you think is causing the weakness?"     unsure 5. MEDICINES: "Have you recently started a new medicine or had a change in the amount of a medicine?"     no 6. OTHER SYMPTOMS: "Do you have any other symptoms?" (e.g., chest pain, fever, cough, SOB, vomiting, diarrhea, bleeding)     Cough, flu symptoms, nauea 7. PREGNANCY: "Is there any chance you are pregnant?" "When was your last menstrual period?" no  Protocols used: WEAKNESS (GENERALIZED) AND FATIGUE-A-AH

## 2017-06-26 NOTE — ED Notes (Signed)
ED Provider at bedside. 

## 2017-06-26 NOTE — ED Notes (Signed)
Pt ambulated to BR with no difficulties. Steady gate noted.

## 2017-06-30 ENCOUNTER — Ambulatory Visit: Payer: Medicare Other | Admitting: Family Medicine

## 2017-07-03 ENCOUNTER — Ambulatory Visit: Payer: Medicare Other | Admitting: Family Medicine

## 2017-07-09 NOTE — Progress Notes (Deleted)
HPI:   [name] is a 72 y.o. female, who is here today to follow on recent ER.   She was seen on 06/26/17 in the ER and Dx with acute bronchitis.  Prescribed Benzonatate 100 mg.  Lab Results  Component Value Date   CREATININE 0.99 06/26/2017   BUN 18 06/26/2017   NA 137 06/26/2017   K 3.1 (L) 06/26/2017   CL 104 06/26/2017   CO2 24 06/26/2017   Lab Results  Component Value Date   WBC 13.2 (H) 06/26/2017   HGB 11.7 (L) 06/26/2017   HCT 35.3 (L) 06/26/2017   MCV 93.9 06/26/2017   PLT 208 06/26/2017    Overall *** is feeling better, no new symptoms reported.   CXR 06/26/17: No active cardiopulmonary disease. No evidence of pneumonia or pulmonary edema.    Review of Systems    Current Outpatient Medications on File Prior to Visit  Medication Sig Dispense Refill  . aspirin EC 81 MG tablet Take 1 tablet (81 mg total) by mouth daily. 30 tablet 0  . atorvastatin (LIPITOR) 20 MG tablet Take 1 tablet (20 mg total) by mouth daily at 6 PM. 30 tablet 2  . benzonatate (TESSALON) 100 MG capsule Take 1 capsule (100 mg total) by mouth every 8 (eight) hours. 21 capsule 0  . folic acid (FOLVITE) 1 MG tablet Take 1 tablet (1 mg total) by mouth daily. 90 tablet 0  . meclizine (ANTIVERT) 25 MG tablet Take 1 tablet (25 mg total) by mouth at bedtime as needed for dizziness. 30 tablet 0  . polyethylene glycol (MIRALAX / GLYCOLAX) packet Take 17 g by mouth daily. 14 each 0  . thiamine (VITAMIN B-1) 100 MG tablet Take 1 tablet (100 mg total) by mouth daily. 90 tablet 0   No current facility-administered medications on file prior to visit.      Past Medical History:  Diagnosis Date  . Anxiety   . Cardiomyopathy (HCC) 08/2016   EF 35% with suggestion of anterior wall motion abnormality. Cannot exclude ischemic versus Takotsubo  . Chicken pox   . Chronic kidney disease    polycystic  . Dehydration    recently hosp  . Depression   . History of kidney stones    cyst on kidney  . Hypertension   . Hypokalemia    Allergies  Allergen Reactions  . No Known Allergies     Social History   Socioeconomic History  . Marital status: Single    Spouse name: Not on file  . Number of children: Not on file  . Years of education: Not on file  . Highest education level: Not on file  Occupational History  . Not on file  Social Needs  . Financial resource strain: Not on file  . Food insecurity:    Worry: Not on file    Inability: Not on file  . Transportation needs:    Medical: Not on file    Non-medical: Not on file  Tobacco Use  . Smoking status: Never Smoker  . Smokeless tobacco: Never Used  Substance and Sexual Activity  . Alcohol use: No  . Drug use: No  . Sexual activity: Not Currently  Lifestyle  . Physical activity:    Days per week: Not on file    Minutes per session: Not on file  . Stress: Not on file  Relationships  . Social connections:    Talks on phone: Not on file  Gets together: Not on file    Attends religious service: Not on file    Active member of club or organization: Not on file    Attends meetings of clubs or organizations: Not on file    Relationship status: Not on file  Other Topics Concern  . Not on file  Social History Narrative  . Not on file    There were no vitals filed for this visit. There is no height or weight on file to calculate BMI.      Physical Exam  ASSESSMENT AND PLAN:  There are no diagnoses linked to this encounter.   No orders of the defined types were placed in this encounter.   No problem-specific Assessment & Plan notes found for this encounter.      Juleon Narang G. [place], MD  [facility]. [place] [facility].

## 2017-07-10 ENCOUNTER — Ambulatory Visit: Payer: Medicare Other | Admitting: Family Medicine

## 2017-08-13 ENCOUNTER — Emergency Department (HOSPITAL_COMMUNITY)
Admission: EM | Admit: 2017-08-13 | Discharge: 2017-08-13 | Disposition: A | Discharge status: Home / Self Care | Payer: Medicare Other | Attending: Emergency Medicine | Admitting: Emergency Medicine

## 2017-08-13 ENCOUNTER — Encounter (HOSPITAL_COMMUNITY): Payer: Self-pay | Admitting: Emergency Medicine

## 2017-08-13 DIAGNOSIS — Z7982 Long term (current) use of aspirin: Secondary | ICD-10-CM | POA: Diagnosis not present

## 2017-08-13 DIAGNOSIS — I10 Essential (primary) hypertension: Secondary | ICD-10-CM

## 2017-08-13 DIAGNOSIS — Z79899 Other long term (current) drug therapy: Secondary | ICD-10-CM | POA: Insufficient documentation

## 2017-08-13 DIAGNOSIS — N189 Chronic kidney disease, unspecified: Secondary | ICD-10-CM | POA: Insufficient documentation

## 2017-08-13 DIAGNOSIS — I129 Hypertensive chronic kidney disease with stage 1 through stage 4 chronic kidney disease, or unspecified chronic kidney disease: Secondary | ICD-10-CM | POA: Diagnosis not present

## 2017-08-13 NOTE — ED Triage Notes (Signed)
Pt to ER for evaluation of hypertension, states PCP taken off her BP medications for hypotension 5-6 months ago. Pt in NAD.

## 2017-08-13 NOTE — ED Provider Notes (Signed)
[facility] EMERGENCY DEPARTMENT Provider Note   CSN: [id number] Arrival date & time: 08/13/17  3888     History   Chief Complaint Chief Complaint  Patient presents with  . Hypertension    HPI Colleen Davis is a 72 y.o. female.  72 year old female who presents with high blood pressure at home.  Systolic at home was 170 with a diastolic above 80.  Does have remote history of high blood pressure but he does not take any medications currently at this time.  Denies any associated chest pain or shortness of breath.  Has had no headache.  No blurred vision.  She was completely asymptomatic and only took her blood pressure because somebody in the house that the same.  Denies any new salt intake.  Has no other complaints of at this time.     Past Medical History:  Diagnosis Date  . Anxiety   . Cardiomyopathy (HCC) 08/2016   EF 35% with suggestion of anterior wall motion abnormality. Cannot exclude ischemic versus Takotsubo  . Chicken pox   . Chronic kidney disease    polycystic  . Dehydration    recently hosp  . Depression   . History of kidney stones    cyst on kidney  . Hypertension   . Hypokalemia     Patient Active Problem List   Diagnosis Date Noted  . Frailty syndrome in geriatric patient 01/12/2017  . Cachexia (HCC) 11/02/2016  . Unstable gait 11/02/2016  . Preoperative cardiovascular examination 09/07/2016  . Takotsubo cardiomyopathy - resolved 09/07/2016  . Abnormal electrocardiogram (ECG) (EKG) 09/07/2016  . Hypotension due to drugs 09/07/2016  . Hypokalemia 08/17/2016  . Symptomatic cholelithiasis 08/17/2016  . Abnormal urinalysis 08/17/2016  . Mild concentric left ventricular hypertrophy (LVH) 08/17/2016  . Severe protein-calorie malnutrition (HCC) 08/17/2016  . Visual hallucinations 08/17/2016  . Syncope and collapse 11/17/2011  . HTN (hypertension) 11/17/2011  . Tinnitus of right ear 11/17/2011  . Hyperlipidemia with target LDL  less than 70 11/17/2011    Past Surgical History:  Procedure Laterality Date  . DILATION AND CURETTAGE OF UTERUS    . NM MYOVIEW LTD  09/14/2016   Normal EF 55-65%. Normal wall motion. No evidence of ischemia or infarction.  . TRANSTHORACIC ECHOCARDIOGRAM  08/18/2016   ** in setting of sepsis/cholecystitis: EF 30-35% with severe hypokinesis of the distal septal, distal inferior, mid/distal anterior, mid/distal anterolateral walls and Akinesis of the apex. The cavity size was normal. Wall thickness was normal. Trivial Aortic regurgitation. Mitral Mild MR  . TUBAL LIGATION       OB History   None      Home Medications    Prior to Admission medications   Medication Sig Start Date End Date Taking? Authorizing Provider  aspirin EC 81 MG tablet Take 1 tablet (81 mg total) by mouth daily. 08/25/16   Rolly Salter, MD  atorvastatin (LIPITOR) 20 MG tablet Take 1 tablet (20 mg total) by mouth daily at 6 PM. 05/26/17   [place], Betty G, MD  benzonatate (TESSALON) 100 MG capsule Take 1 capsule (100 mg total) by mouth every 8 (eight) hours. 06/26/17   Rolan Bucco, MD  folic acid (FOLVITE) 1 MG tablet Take 1 tablet (1 mg total) by mouth daily. 11/30/16   [place], Betty G, MD  meclizine (ANTIVERT) 25 MG tablet Take 1 tablet (25 mg total) by mouth at bedtime as needed for dizziness. 11/30/16   [place], Betty G, MD  polyethylene glycol [facility] /  GLYCOLAX) packet Take 17 g by mouth daily. 08/25/16   Rolly Salter, MD  thiamine (VITAMIN B-1) 100 MG tablet Take 1 tablet (100 mg total) by mouth daily. 11/30/16   [place], Betty G, MD    Family History Family History  Problem Relation Age of Onset  . Mental illness Mother        "Dementia"  . Hyperlipidemia Mother   . Lung cancer Father   . Hypertension Father   . Hypertension Sister   . Other Sister        non-alcohol fatty disease  . Colon cancer Sister     Social History Social History   Tobacco Use  . Smoking status: Never Smoker  .  Smokeless tobacco: Never Used  Substance Use Topics  . Alcohol use: No  . Drug use: No     Allergies   No known allergies   Review of Systems Review of Systems  All other systems reviewed and are negative.    Physical Exam Updated Vital Signs BP (!) 174/70   Pulse 69   Temp 98.2 F (36.8 C) (Oral)   Resp 20   SpO2 94%   Physical Exam  Constitutional: She is oriented to person, place, and time. She appears well-developed and well-nourished.  Non-toxic appearance. No distress.  HENT:  Head: Normocephalic and atraumatic.  Eyes: Pupils are equal, round, and reactive to light. Conjunctivae, EOM and lids are normal.  Neck: Normal range of motion. Neck supple. No tracheal deviation present. No thyroid mass present.  Cardiovascular: Normal rate, regular rhythm and normal heart sounds. Exam reveals no gallop.  No murmur heard. Pulmonary/Chest: Effort normal and breath sounds normal. No stridor. No respiratory distress. She has no decreased breath sounds. She has no wheezes. She has no rhonchi. She has no rales.  Abdominal: Soft. Normal appearance and bowel sounds are normal. She exhibits no distension. There is no tenderness. There is no rebound and no CVA tenderness.  Musculoskeletal: Normal range of motion. She exhibits no edema or tenderness.  Neurological: She is alert and oriented to person, place, and time. She has normal strength. No cranial nerve deficit or sensory deficit. GCS eye subscore is 4. GCS verbal subscore is 5. GCS motor subscore is 6.  Skin: Skin is warm and dry. No abrasion and no rash noted.  Psychiatric: She has a normal mood and affect. Her speech is normal and behavior is normal.  Nursing note and vitals reviewed.    ED Treatments / Results  Labs (all labs ordered are listed, but only abnormal results are displayed) Labs Reviewed - No data to display  EKG None  Radiology No results found.  Procedures Procedures (including critical care  time)  Medications Ordered in ED Medications - No data to display   Initial Impression / Assessment and Plan / ED Course  I have reviewed the triage vital signs and the nursing notes.  Pertinent labs & imaging results that were available during my care of the patient were reviewed by me and considered in my medical decision making (see chart for details).     Patient here with asymptomatic hypertension.  Repeat blood pressure here in my presence was 158/70.  I have encouraged her to call her doctor next week to schedule a follow-up visit.  Return precautions given.  Final Clinical Impressions(s) / ED Diagnoses   Final diagnoses:  None    ED Discharge Orders    None       Lorre Nick,  MD 08/13/17 1229

## 2017-08-27 NOTE — Progress Notes (Signed)
Subjective:   Colleen Davis is a 72 y.o. female who presents for Medicare Annual (Initial ) preventive examination.  Reports health as ok  Lives with son and his GF who have a baby 2 yo Likes having the baby   Bed at 12 and wakes up 7am;  Eats one times a day 2pm.  Potato chips at 2pm; family cooks when home from work  States she has her bedroom, shower etc    Diet Non descript    Exercise Does not exercise  Encouraged to get up and move more  Health Maintenance Due  Topic Date Due  . Hepatitis C Screening  1946/03/13   Declines colonoscopy and cologuard failed  Mammogram 11/2016  Bone Density declines Declines pneumonia vaccines  Hepatitis C ordered for future lab draw    Cardiac Risk Factors include: advanced age (>72men, >70 women);dyslipidemia;family history of premature cardiovascular disease;hypertension;sedentary lifestyle     Objective:     Vitals: BP (!) 156/84   Pulse 81   Ht 5\' 1"  (1.549 m)   Wt 125 lb (56.7 kg)   SpO2 97%   BMI 23.62 kg/m   Body mass index is 23.62 kg/m.  Advanced Directives 08/29/2017 08/29/2017 06/26/2017 09/06/2016 08/20/2016 08/13/2016 08/06/2016  Does Patient Have a Medical Advance Directive? No No Yes Yes No No No  Type of Advance Directive - - Healthcare Power of Attorney - - - -  Does patient want to make changes to medical advance directive? - - - Yes (ED - Information included in AVS) - - -  Would patient like information on creating a medical advance directive? - - - - No - Patient declined No - Patient declined No - Patient declined    Tobacco Social History   Tobacco Use  Smoking Status Never Smoker  Smokeless Tobacco Never Used     Counseling given: Yes   Clinical Intake:    Past Medical History:  Diagnosis Date  . Anxiety   . Cardiomyopathy (HCC) 08/2016   EF 35% with suggestion of anterior wall motion abnormality. Cannot exclude ischemic versus Takotsubo  . Chicken pox   . Chronic kidney disease    polycystic  . Dehydration    recently hosp  . Depression   . History of kidney stones    cyst on kidney  . Hypertension   . Hypokalemia    Past Surgical History:  Procedure Laterality Date  . DILATION AND CURETTAGE OF UTERUS    . NM MYOVIEW LTD  09/14/2016   Normal EF 55-65%. Normal wall motion. No evidence of ischemia or infarction.  . TRANSTHORACIC ECHOCARDIOGRAM  08/18/2016   ** in setting of sepsis/cholecystitis: EF 30-35% with severe hypokinesis of the distal septal, distal inferior, mid/distal anterior, mid/distal anterolateral walls and Akinesis of the apex. The cavity size was normal. Wall thickness was normal. Trivial Aortic regurgitation. Mitral Mild MR  . TUBAL LIGATION     Family History  Problem Relation Age of Onset  . Mental illness Mother        "Dementia"  . Hyperlipidemia Mother   . Lung cancer Father   . Hypertension Father   . Hypertension Sister   . Other Sister        non-alcohol fatty disease  . Colon cancer Sister    Social History   Socioeconomic History  . Marital status: Single    Spouse name: Not on file  . Number of children: Not on file  . Years of education: Not  on file  . Highest education level: Not on file  Occupational History  . Not on file  Social Needs  . Financial resource strain: Not on file  . Food insecurity:    Worry: Not on file    Inability: Not on file  . Transportation needs:    Medical: Not on file    Non-medical: Not on file  Tobacco Use  . Smoking status: Never Smoker  . Smokeless tobacco: Never Used  Substance and Sexual Activity  . Alcohol use: No  . Drug use: No  . Sexual activity: Not Currently  Lifestyle  . Physical activity:    Days per week: Not on file    Minutes per session: Not on file  . Stress: Not on file  Relationships  . Social connections:    Talks on phone: Not on file    Gets together: Not on file    Attends religious service: Not on file    Active member of club or organization: Not  on file    Attends meetings of clubs or organizations: Not on file    Relationship status: Not on file  Other Topics Concern  . Not on file  Social History Narrative  . Not on file    Outpatient Encounter Medications as of 08/29/2017  Medication Sig  . amLODipine (NORVASC) 5 MG tablet Take 1 tablet (5 mg total) by mouth daily.  Marland Kitchen aspirin EC 81 MG tablet Take 1 tablet (81 mg total) by mouth daily.  Marland Kitchen atorvastatin (LIPITOR) 20 MG tablet Take 1 tablet (20 mg total) by mouth daily at 6 PM.  . clotrimazole-betamethasone (LOTRISONE) cream Apply 1 application topically 2 (two) times daily.  . folic acid (FOLVITE) 1 MG tablet Take 1 tablet (1 mg total) by mouth daily.  Marland Kitchen nystatin (NYSTATIN) powder Apply topically 4 (four) times daily.  . polyethylene glycol (MIRALAX / GLYCOLAX) packet Take 17 g by mouth daily.  Marland Kitchen thiamine (VITAMIN B-1) 100 MG tablet Take 1 tablet (100 mg total) by mouth daily.  . [DISCONTINUED] benzonatate (TESSALON) 100 MG capsule Take 1 capsule (100 mg total) by mouth every 8 (eight) hours.  . [DISCONTINUED] meclizine (ANTIVERT) 25 MG tablet Take 1 tablet (25 mg total) by mouth at bedtime as needed for dizziness.   No facility-administered encounter medications on file as of 08/29/2017.     Activities of Daily Living In your present state of health, do you have any difficulty performing the following activities: 08/29/2017  Hearing? N  Vision? N  Walking or climbing stairs? Y  Dressing or bathing? N  Doing errands, shopping? N  Preparing Food and eating ? N  Using the Toilet? N  In the past six months, have you accidently leaked urine? N  Do you have problems with loss of bowel control? N  Managing your Medications? Y  Managing your Finances? N  Housekeeping or managing your Housekeeping? N  Some recent data might be hidden    Patient Care Team: [place], Betty G, MD as PCP - General (Family Medicine) Manus Rudd, MD as Consulting Physician (General Surgery)      Assessment:   This is a routine wellness examination for [name].  Exercise Activities and Dietary recommendations Current Exercise Habits: Home exercise routine, Intensity: Mild  Goals    . Exercise 150 min/wk Moderate Activity     Get up and keep moving!       Fall Risk Fall Risk  08/29/2017  Falls in the past year? No  Depression Screen PHQ 2/9 Scores 08/29/2017  PHQ - 2 Score 1     Cognitive Function MMSE - Mini Mental State Exam 08/29/2017  Orientation to time 5  Orientation to Place 3  Registration 3  Attention/ Calculation 5  Recall 3  Language- name 2 objects 2  Language- repeat 0  Language- follow 3 step command 3  Language- read & follow direction 1  Write a sentence 1  Copy design 0  Total score 26   Issues stating words; no ifs ands or butts difficult. REcall 3/3; math serial 3s from 20 x 5 Oriented; failed picture, confused as to floor but cued and corrected; seems to be able to make decisions and states she enjoys living with her son. Enjoys the grand baby. He stays with a sitter during the day. No accidents with stove or other while she is at home during the day.    Ad8 score reviewed for issues:  Issues making decisions:  Less interest in hobbies / activities:  Repeats questions, stories (family complaining):  Trouble using ordinary gadgets (microwave, computer, phone):  Forgets the month or year:   Mismanaging finances:   Remembering appts:  Daily problems with thinking and/or memory: Ad8 score is=0     Immunization History  Administered Date(s) Administered  . Influenza, High Dose Seasonal PF 01/13/2017      Screening Tests Health Maintenance  Topic Date Due  . Hepatitis C Screening  [birth year]  . DEXA SCAN  08/30/2018 (Originally 10/18/2011)  . COLONOSCOPY  08/30/2018 (Originally 10/17/1996)  . TETANUS/TDAP  08/30/2018 (Originally 10/17/1965)  . PNA vac Low Risk Adult (1 of 2 - PCV13) 08/30/2018 (Originally 10/18/2011)  .  INFLUENZA VACCINE  11/16/2017  . MAMMOGRAM  12/09/2018        Plan:     PCP Notes   Health Maintenance Declines colonoscopy   Declines pneumovax  Confused regarding shingles, stated she had 2 but not clear if she has the shingrix.  Declines colonoscopy  Declines colo-guard   Declines dexa  Hep c for next blood draw; blood already drawn today  She did agree to check    Abnormal Screens  MMSE 26/30; verbal issues;  Issues stating words; no ifs ands or butts difficult. REcall 3/3; math serial 3s from 20 x 5 Oriented; failed picture, confused as to floor but cued and corrected; seems to be able to make decisions and states she enjoys living with her son. Enjoys the grand baby. He stays with a sitter during the day. No accidents with stove or other while she is at home during the day.  Referrals  none  Patient concerns; None noted  Nurse Concerns; Seems safe in current environment; no failures of independent living; states she manages her meds   Next PCP apt 4 weeks with Dr. [place] to evaluate her BP medicine    I have personally reviewed and noted the following in the patient's chart:   . Medical and social history . Use of alcohol, tobacco or illicit drugs  . Current medications and supplements . Functional ability and status . Nutritional status . Physical activity . Advanced directives . List of other physicians . Hospitalizations, surgeries, and ER visits in previous 12 months . Vitals . Screenings to include cognitive, depression, and falls . Referrals and appointments  In addition, I have reviewed and discussed with patient certain preventive protocols, quality metrics, and best practice recommendations. A written personalized care plan for preventive services as well as general preventive health  recommendations were provided to patient.     Montine Circle, RN  08/29/2017

## 2017-08-29 ENCOUNTER — Ambulatory Visit (INDEPENDENT_AMBULATORY_CARE_PROVIDER_SITE_OTHER): Payer: Medicare Other

## 2017-08-29 ENCOUNTER — Ambulatory Visit (INDEPENDENT_AMBULATORY_CARE_PROVIDER_SITE_OTHER): Payer: Medicare Other | Admitting: Family Medicine

## 2017-08-29 ENCOUNTER — Encounter: Payer: Self-pay | Admitting: Family Medicine

## 2017-08-29 VITALS — BP 165/95 | HR 81 | Temp 97.9°F | Resp 12 | Ht 61.0 in | Wt 125.0 lb

## 2017-08-29 VITALS — BP 156/84 | HR 81 | Ht 61.0 in | Wt 125.0 lb

## 2017-08-29 DIAGNOSIS — I10 Essential (primary) hypertension: Secondary | ICD-10-CM | POA: Diagnosis not present

## 2017-08-29 DIAGNOSIS — L304 Erythema intertrigo: Secondary | ICD-10-CM | POA: Diagnosis not present

## 2017-08-29 DIAGNOSIS — E876 Hypokalemia: Secondary | ICD-10-CM | POA: Diagnosis not present

## 2017-08-29 DIAGNOSIS — Z1159 Encounter for screening for other viral diseases: Secondary | ICD-10-CM | POA: Diagnosis not present

## 2017-08-29 DIAGNOSIS — Z0000 Encounter for general adult medical examination without abnormal findings: Secondary | ICD-10-CM

## 2017-08-29 LAB — POTASSIUM: Potassium: 4 mEq/L (ref 3.5–5.1)

## 2017-08-29 MED ORDER — CLOTRIMAZOLE-BETAMETHASONE 1-0.05 % EX CREA: 1.0000 "application " | TOPICAL_CREAM | Freq: Two times a day (BID) | CUTANEOUS | 1 refills | Status: DC

## 2017-08-29 MED ORDER — NYSTATIN 100000 UNIT/GM EX POWD: Freq: Four times a day (QID) | CUTANEOUS | 0 refills | Status: DC

## 2017-08-29 MED ORDER — AMLODIPINE BESYLATE 5 MG PO TABS: 5.0000 mg | ORAL_TABLET | Freq: Every day | ORAL | 2 refills | Status: DC

## 2017-08-29 NOTE — Progress Notes (Signed)
I have reviewed documentation from this visit and I agree with recommendations given.  Betty G. Jordan, MD  [facility]. [place] office.   

## 2017-08-29 NOTE — Patient Instructions (Addendum)
Colleen Davis , Thank you for taking time to come for your Medicare Wellness Visit. I appreciate your ongoing commitment to your health goals. Please review the following plan we discussed and let me know if I can assist you in the future.   There are new vaccines Shingrix is a vaccine for the prevention of Shingles in Adults 50 and older.  If you are on Medicare, the shingrix is covered under your Part D plan, so you will take both of the vaccines in the series at your pharmacy. Please check with your benefits regarding applicable copays or out of pocket expenses.  The Shingrix is given in 2 vaccines approx 8 weeks apart. You must receive the 2nd dose prior to 6 months from receipt of the first. Please have the pharmacist print out you Immunization  dates for our office records   A Tetanus is recommended every 10 years. Medicare covers a tetanus if you have a cut or wound; otherwise, there may be a charge. If you had not had a tetanus with pertusses, known as the Tdap, you can take this anytime.     These are the goals we discussed: Goals    . Exercise 150 min/wk Moderate Activity     Get up and keep moving!       This is a list of the screening recommended for you and due dates:  Health Maintenance  Topic Date Due  .  Hepatitis C: One time screening is recommended by Center for Disease Control  (CDC) for  adults born from [birth year] through 1964.   1945-10-11  . Tetanus Vaccine  10/17/1965  . Colon Cancer Screening  10/17/1996  . DEXA scan (bone density measurement)  10/18/2011  . Pneumonia vaccines (1 of 2 - PCV13) 10/18/2011  . Flu Shot  11/16/2017  . Mammogram  12/09/2018    DASH Eating Plan DASH stands for "Dietary Approaches to Stop Hypertension." The DASH eating plan is a healthy eating plan that has been shown to reduce high blood pressure (hypertension). It may also reduce your risk for type 2 diabetes, heart disease, and stroke. The DASH eating plan may also help with weight  loss. What are tips for following this plan? General guidelines  Avoid eating more than 2,300 mg (milligrams) of salt (sodium) a day. If you have hypertension, you may need to reduce your sodium intake to 1,500 mg a day.  Limit alcohol intake to no more than 1 drink a day for nonpregnant women and 2 drinks a day for men. One drink equals 12 oz of beer, 5 oz of wine, or 1 oz of hard liquor.  Work with your health care provider to maintain a healthy body weight or to lose weight. Ask what an ideal weight is for you.  Get at least 30 minutes of exercise that causes your heart to beat faster (aerobic exercise) most days of the week. Activities may include walking, swimming, or biking.  Work with your health care provider or diet and nutrition specialist (dietitian) to adjust your eating plan to your individual calorie needs. Reading food labels  Check food labels for the amount of sodium per serving. Choose foods with less than 5 percent of the Daily Value of sodium. Generally, foods with less than 300 mg of sodium per serving fit into this eating plan.  To find whole grains, look for the word "whole" as the first word in the ingredient list. Shopping  Buy products labeled as "low-sodium" or "no salt  added."  Buy fresh foods. Avoid canned foods and premade or frozen meals. Cooking  Avoid adding salt when cooking. Use salt-free seasonings or herbs instead of table salt or sea salt. Check with your health care provider or pharmacist before using salt substitutes.  Do not fry foods. Cook foods using healthy methods such as baking, boiling, grilling, and broiling instead.  Cook with heart-healthy oils, such as olive, canola, soybean, or sunflower oil. Meal planning   Eat a balanced diet that includes: ? 5 or more servings of fruits and vegetables each day. At each meal, try to fill half of your plate with fruits and vegetables. ? Up to 6-8 servings of whole grains each day. ? Less than 6  oz of lean meat, poultry, or fish each day. A 3-oz serving of meat is about the same size as a deck of cards. One egg equals 1 oz. ? 2 servings of low-fat dairy each day. ? A serving of nuts, seeds, or beans 5 times each week. ? Heart-healthy fats. Healthy fats called Omega-3 fatty acids are found in foods such as flaxseeds and coldwater fish, like sardines, salmon, and mackerel.  Limit how much you eat of the following: ? Canned or prepackaged foods. ? Food that is high in trans fat, such as fried foods. ? Food that is high in saturated fat, such as fatty meat. ? Sweets, desserts, sugary drinks, and other foods with added sugar. ? Full-fat dairy products.  Do not salt foods before eating.  Try to eat at least 2 vegetarian meals each week.  Eat more home-cooked food and less restaurant, buffet, and fast food.  When eating at a restaurant, ask that your food be prepared with less salt or no salt, if possible. What foods are recommended? The items listed may not be a complete list. Talk with your dietitian about what dietary choices are best for you. Grains Whole-grain or whole-wheat bread. Whole-grain or whole-wheat pasta. Brown rice. [place]. Bulgur. Whole-grain and low-sodium cereals. Pita bread. Low-fat, low-sodium crackers. Whole-wheat flour tortillas. Vegetables Fresh or frozen vegetables (raw, steamed, roasted, or grilled). Low-sodium or reduced-sodium tomato and vegetable juice. Low-sodium or reduced-sodium tomato sauce and tomato paste. Low-sodium or reduced-sodium canned vegetables. Fruits All fresh, dried, or frozen fruit. Canned fruit in natural juice (without added sugar). Meat and other protein foods Skinless chicken or [place]. Ground chicken or [place]. Pork with fat trimmed off. Fish and seafood. Egg whites. Dried beans, peas, or lentils. Unsalted nuts, nut butters, and seeds. Unsalted canned beans. Lean cuts of beef with fat trimmed off. Low-sodium, lean deli  meat. Dairy Low-fat (1%) or fat-free (skim) milk. Fat-free, low-fat, or reduced-fat cheeses. Nonfat, low-sodium ricotta or cottage cheese. Low-fat or nonfat yogurt. Low-fat, low-sodium cheese. Fats and oils Soft margarine without trans fats. Vegetable oil. Low-fat, reduced-fat, or light mayonnaise and salad dressings (reduced-sodium). Canola, safflower, olive, soybean, and sunflower oils. Avocado. Seasoning and other foods Herbs. Spices. Seasoning mixes without salt. Unsalted popcorn and pretzels. Fat-free sweets. What foods are not recommended? The items listed may not be a complete list. Talk with your dietitian about what dietary choices are best for you. Grains Baked goods made with fat, such as croissants, muffins, or some breads. Dry pasta or rice meal packs. Vegetables Creamed or fried vegetables. Vegetables in a cheese sauce. Regular canned vegetables (not low-sodium or reduced-sodium). Regular canned tomato sauce and paste (not low-sodium or reduced-sodium). Regular tomato and vegetable juice (not low-sodium or reduced-sodium). Angie Fava. Olives. Fruits Canned  fruit in a light or heavy syrup. Fried fruit. Fruit in cream or butter sauce. Meat and other protein foods Fatty cuts of meat. Ribs. Fried meat. Berniece Salines. Sausage. Bologna and other processed lunch meats. Salami. Fatback. Hotdogs. Bratwurst. Salted nuts and seeds. Canned beans with added salt. Canned or smoked fish. Whole eggs or egg yolks. Chicken or [place] with skin. Dairy Whole or 2% milk, cream, and half-and-half. Whole or full-fat cream cheese. Whole-fat or sweetened yogurt. Full-fat cheese. Nondairy creamers. Whipped toppings. Processed cheese and cheese spreads. Fats and oils Butter. Stick margarine. Lard. Shortening. Ghee. Bacon fat. Tropical oils, such as coconut, palm kernel, or palm oil. Seasoning and other foods Salted popcorn and pretzels. Onion salt, garlic salt, seasoned salt, table salt, and sea salt. Worcestershire  sauce. Tartar sauce. Barbecue sauce. Teriyaki sauce. Soy sauce, including reduced-sodium. Steak sauce. Canned and packaged gravies. Fish sauce. Oyster sauce. Cocktail sauce. Horseradish that you find on the shelf. Ketchup. Mustard. Meat flavorings and tenderizers. Bouillon cubes. Hot sauce and Tabasco sauce. Premade or packaged marinades. Premade or packaged taco seasonings. Relishes. Regular salad dressings. Where to find more information:  National Heart, Lung, and [place]: https://wilson-eaton.com/  American Heart Association: www.heart.org Summary  The DASH eating plan is a healthy eating plan that has been shown to reduce high blood pressure (hypertension). It may also reduce your risk for type 2 diabetes, heart disease, and stroke.  With the DASH eating plan, you should limit salt (sodium) intake to 2,300 mg a day. If you have hypertension, you may need to reduce your sodium intake to 1,500 mg a day.  When on the DASH eating plan, aim to eat more fresh fruits and vegetables, whole grains, lean proteins, low-fat dairy, and heart-healthy fats.  Work with your health care provider or diet and nutrition specialist (dietitian) to adjust your eating plan to your individual calorie needs. This information is not intended to replace advice given to you by your health care provider. Make sure you discuss any questions you have with your health care provider. Document Released: 03/24/2011 Document Revised: 03/28/2016 Document Reviewed: 03/28/2016 Elsevier Interactive Patient Education  2018 [place] in the Home Falls can cause injuries. They can happen to people of all ages. There are many things you can do to make your home safe and to help prevent falls. What can I do on the outside of my home?  Regularly fix the edges of walkways and driveways and fix any cracks.  Remove anything that might make you trip as you walk through a door, such as a raised step or  threshold.  Trim any bushes or trees on the path to your home.  Use bright outdoor lighting.  Clear any walking paths of anything that might make someone trip, such as rocks or tools.  Regularly check to see if handrails are loose or broken. Make sure that both sides of any steps have handrails.  Any raised decks and porches should have guardrails on the edges.  Have any leaves, snow, or ice cleared regularly.  Use sand or salt on walking paths during winter.  Clean up any spills in your garage right away. This includes oil or grease spills. What can I do in the bathroom?  Use night lights.  Install grab bars by the toilet and in the tub and shower. Do not use towel bars as grab bars.  Use non-skid mats or decals in the tub or shower.  If you need to  sit down in the shower, use a plastic, non-slip stool.  Keep the floor dry. Clean up any water that spills on the floor as soon as it happens.  Remove soap buildup in the tub or shower regularly.  Attach bath mats securely with double-sided non-slip rug tape.  Do not have throw rugs and other things on the floor that can make you trip. What can I do in the bedroom?  Use night lights.  Make sure that you have a light by your bed that is easy to reach.  Do not use any sheets or blankets that are too big for your bed. They should not hang down onto the floor.  Have a firm chair that has side arms. You can use this for support while you get dressed.  Do not have throw rugs and other things on the floor that can make you trip. What can I do in the kitchen?  Clean up any spills right away.  Avoid walking on wet floors.  Keep items that you use a lot in easy-to-reach places.  If you need to reach something above you, use a strong step stool that has a grab bar.  Keep electrical cords out of the way.  Do not use floor polish or wax that makes floors slippery. If you must use wax, use non-skid floor wax.  Do not have  throw rugs and other things on the floor that can make you trip. What can I do with my stairs?  Do not leave any items on the stairs.  Make sure that there are handrails on both sides of the stairs and use them. Fix handrails that are broken or loose. Make sure that handrails are as long as the stairways.  Check any carpeting to make sure that it is firmly attached to the stairs. Fix any carpet that is loose or worn.  Avoid having throw rugs at the top or bottom of the stairs. If you do have throw rugs, attach them to the floor with carpet tape.  Make sure that you have a light switch at the top of the stairs and the bottom of the stairs. If you do not have them, ask someone to add them for you. What else can I do to help prevent falls?  Wear shoes that: ? Do not have high heels. ? Have rubber bottoms. ? Are comfortable and fit you well. ? Are closed at the toe. Do not wear sandals.  If you use a stepladder: ? Make sure that it is fully opened. Do not climb a closed stepladder. ? Make sure that both sides of the stepladder are locked into place. ? Ask someone to hold it for you, if possible.  Clearly mark and make sure that you can see: ? Any grab bars or handrails. ? First and last steps. ? Where the edge of each step is.  Use tools that help you move around (mobility aids) if they are needed. These include: ? Canes. ? Walkers. ? Scooters. ? Crutches.  Turn on the lights when you go into a dark area. Replace any light bulbs as soon as they burn out.  Set up your furniture so you have a clear path. Avoid moving your furniture around.  If any of your floors are uneven, fix them.  If there are any pets around you, be aware of where they are.  Review your medicines with your doctor. Some medicines can make you feel dizzy. This can increase your chance of  falling. Ask your doctor what other things that you can do to help prevent falls. This information is not intended to  replace advice given to you by your health care provider. Make sure you discuss any questions you have with your health care provider. Document Released: 01/29/2009 Document Revised: 09/10/2015 Document Reviewed: 05/09/2014 Elsevier Interactive Patient Education  2018 [place] Maintenance, Female Adopting a healthy lifestyle and getting preventive care can go a long way to promote health and wellness. Talk with your health care provider about what schedule of regular examinations is right for you. This is a good chance for you to check in with your provider about disease prevention and staying healthy. In between checkups, there are plenty of things you can do on your own. Experts have done a lot of research about which lifestyle changes and preventive measures are most likely to keep you healthy. Ask your health care provider for more information. Weight and diet Eat a healthy diet  Be sure to include plenty of vegetables, fruits, low-fat dairy products, and lean protein.  Do not eat a lot of foods high in solid fats, added sugars, or salt.  Get regular exercise. This is one of the most important things you can do for your health. ? Most adults should exercise for at least 150 minutes each week. The exercise should increase your heart rate and make you sweat (moderate-intensity exercise). ? Most adults should also do strengthening exercises at least twice a week. This is in addition to the moderate-intensity exercise.  Maintain a healthy weight  Body mass index (BMI) is a measurement that can be used to identify possible weight problems. It estimates body fat based on height and weight. Your health care provider can help determine your BMI and help you achieve or maintain a healthy weight.  For females 75 years of age and older: ? A BMI below 18.5 is considered underweight. ? A BMI of 18.5 to 24.9 is normal. ? A BMI of 25 to 29.9 is considered overweight. ? A BMI of 30 and  above is considered obese.  Watch levels of cholesterol and blood lipids  You should start having your blood tested for lipids and cholesterol at 71 years of age, then have this test every 5 years.  You may need to have your cholesterol levels checked more often if: ? Your lipid or cholesterol levels are high. ? You are older than 71 years of age. ? You are at high risk for heart disease.  Cancer screening Lung Cancer  Lung cancer screening is recommended for adults 76-37 years old who are at high risk for lung cancer because of a history of smoking.  A yearly low-dose CT scan of the lungs is recommended for people who: ? Currently smoke. ? Have quit within the past 15 years. ? Have at least a 30-pack-year history of smoking. A pack year is smoking an average of one pack of cigarettes a day for 1 year.  Yearly screening should continue until it has been 15 years since you quit.  Yearly screening should stop if you develop a health problem that would prevent you from having lung cancer treatment.  Breast Cancer  Practice breast self-awareness. This means understanding how your breasts normally appear and feel.  It also means doing regular breast self-exams. Let your health care provider know about any changes, no matter how small.  If you are in your 20s or 30s, you should have a clinical breast exam (  CBE) by a health care provider every 1-3 years as part of a regular health exam.  If you are 68 or older, have a CBE every year. Also consider having a breast X-ray (mammogram) every year.  If you have a family history of breast cancer, talk to your health care provider about genetic screening.  If you are at high risk for breast cancer, talk to your health care provider about having an MRI and a mammogram every year.  Breast cancer gene (BRCA) assessment is recommended for women who have family members with BRCA-related cancers. BRCA-related cancers  include: ? Breast. ? Ovarian. ? Tubal. ? Peritoneal cancers.  Results of the assessment will determine the need for genetic counseling and BRCA1 and BRCA2 testing.  Cervical Cancer Your health care provider may recommend that you be screened regularly for cancer of the pelvic organs (ovaries, uterus, and vagina). This screening involves a pelvic examination, including checking for microscopic changes to the surface of your cervix (Pap test). You may be encouraged to have this screening done every 3 years, beginning at age 70.  For women ages 56-65, health care providers may recommend pelvic exams and Pap testing every 3 years, or they may recommend the Pap and pelvic exam, combined with testing for human papilloma virus (HPV), every 5 years. Some types of HPV increase your risk of cervical cancer. Testing for HPV may also be done on women of any age with unclear Pap test results.  Other health care providers may not recommend any screening for nonpregnant women who are considered low risk for pelvic cancer and who do not have symptoms. Ask your health care provider if a screening pelvic exam is right for you.  If you have had past treatment for cervical cancer or a condition that could lead to cancer, you need Pap tests and screening for cancer for at least 20 years after your treatment. If Pap tests have been discontinued, your risk factors (such as having a new sexual partner) need to be reassessed to determine if screening should resume. Some women have medical problems that increase the chance of getting cervical cancer. In these cases, your health care provider may recommend more frequent screening and Pap tests.  Colorectal Cancer  This type of cancer can be detected and often prevented.  Routine colorectal cancer screening usually begins at 71 years of age and continues through 71 years of age.  Your health care provider may recommend screening at an earlier age if you have risk factors  for colon cancer.  Your health care provider may also recommend using home test kits to check for hidden blood in the stool.  A small camera at the end of a tube can be used to examine your colon directly (sigmoidoscopy or colonoscopy). This is done to check for the earliest forms of colorectal cancer.  Routine screening usually begins at age 58.  Direct examination of the colon should be repeated every 5-10 years through 71 years of age. However, you may need to be screened more often if early forms of precancerous polyps or small growths are found.  Skin Cancer  Check your skin from head to toe regularly.  Tell your health care provider about any new moles or changes in moles, especially if there is a change in a mole's shape or color.  Also tell your health care provider if you have a mole that is larger than the size of a pencil eraser.  Always use sunscreen. Apply sunscreen liberally  and repeatedly throughout the day.  Protect yourself by wearing long sleeves, pants, a wide-brimmed hat, and sunglasses whenever you are outside.  Heart disease, diabetes, and high blood pressure  High blood pressure causes heart disease and increases the risk of stroke. High blood pressure is more likely to develop in: ? People who have blood pressure in the high end of the normal range (130-139/85-89 mm Hg). ? People who are overweight or obese. ? People who are African American.  If you are 46-16 years of age, have your blood pressure checked every 3-5 years. If you are 10 years of age or older, have your blood pressure checked every year. You should have your blood pressure measured twice-once when you are at a hospital or clinic, and once when you are not at a hospital or clinic. Record the average of the two measurements. To check your blood pressure when you are not at a hospital or clinic, you can use: ? An automated blood pressure machine at a pharmacy. ? A home blood pressure monitor.  If  you are between 28 years and 79 years old, ask your health care provider if you should take aspirin to prevent strokes.  Have regular diabetes screenings. This involves taking a blood sample to check your fasting blood sugar level. ? If you are at a normal weight and have a low risk for diabetes, have this test once every three years after 71 years of age. ? If you are overweight and have a high risk for diabetes, consider being tested at a younger age or more often. Preventing infection Hepatitis B  If you have a higher risk for hepatitis B, you should be screened for this virus. You are considered at high risk for hepatitis B if: ? You were born in a country where hepatitis B is common. Ask your health care provider which countries are considered high risk. ? Your parents were born in a high-risk country, and you have not been immunized against hepatitis B (hepatitis B vaccine). ? You have HIV or AIDS. ? You use needles to inject street drugs. ? You live with someone who has hepatitis B. ? You have had sex with someone who has hepatitis B. ? You get hemodialysis treatment. ? You take certain medicines for conditions, including cancer, organ transplantation, and autoimmune conditions.  Hepatitis C  Blood testing is recommended for: ? Everyone born from [birth year] through 1964. ? Anyone with known risk factors for hepatitis C.  Sexually transmitted infections (STIs)  You should be screened for sexually transmitted infections (STIs) including gonorrhea and chlamydia if: ? You are sexually active and are younger than 71 years of age. ? You are older than 71 years of age and your health care provider tells you that you are at risk for this type of infection. ? Your sexual activity has changed since you were last screened and you are at an increased risk for chlamydia or gonorrhea. Ask your health care provider if you are at risk.  If you do not have HIV, but are at risk, it may be recommended  that you take a prescription medicine daily to prevent HIV infection. This is called pre-exposure prophylaxis (PrEP). You are considered at risk if: ? You are sexually active and do not regularly use condoms or know the HIV status of your partner(s). ? You take drugs by injection. ? You are sexually active with a partner who has HIV.  Talk with your health care provider about whether  you are at high risk of being infected with HIV. If you choose to begin PrEP, you should first be tested for HIV. You should then be tested every 3 months for as long as you are taking PrEP. Pregnancy  If you are premenopausal and you may become pregnant, ask your health care provider about preconception counseling.  If you may become pregnant, take 400 to 800 micrograms (mcg) of folic acid every day.  If you want to prevent pregnancy, talk to your health care provider about birth control (contraception). Osteoporosis and menopause  Osteoporosis is a disease in which the bones lose minerals and strength with aging. This can result in serious bone fractures. Your risk for osteoporosis can be identified using a bone density scan.  If you are 43 years of age or older, or if you are at risk for osteoporosis and fractures, ask your health care provider if you should be screened.  Ask your health care provider whether you should take a calcium or vitamin D supplement to lower your risk for osteoporosis.  Menopause may have certain physical symptoms and risks.  Hormone replacement therapy may reduce some of these symptoms and risks. Talk to your health care provider about whether hormone replacement therapy is right for you. Follow these instructions at home:  Schedule regular health, dental, and eye exams.  Stay current with your immunizations.  Do not use any tobacco products including cigarettes, chewing tobacco, or electronic cigarettes.  If you are pregnant, do not drink alcohol.  If you are  breastfeeding, limit how much and how often you drink alcohol.  Limit alcohol intake to no more than 1 drink per day for nonpregnant women. One drink equals 12 ounces of beer, 5 ounces of wine, or 1 ounces of hard liquor.  Do not use street drugs.  Do not share needles.  Ask your health care provider for help if you need support or information about quitting drugs.  Tell your health care provider if you often feel depressed.  Tell your health care provider if you have ever been abused or do not feel safe at home. This information is not intended to replace advice given to you by your health care provider. Make sure you discuss any questions you have with your health care provider. Document Released: 10/18/2010 Document Revised: 09/10/2015 Document Reviewed: 01/06/2015 Elsevier Interactive Patient Education  [employer].

## 2017-08-29 NOTE — Patient Instructions (Signed)
A few things to remember from today's visit:   Essential hypertension - Plan: amLODipine (NORVASC) 5 MG tablet  Intertrigo - Plan: clotrimazole-betamethasone (LOTRISONE) cream, nystatin (NYSTATIN) powder  Hypokalemia - Plan: Potassium  Today Amlodipine was added.  Monitor blood pressure daily.  DASH Eating Plan DASH stands for "Dietary Approaches to Stop Hypertension." The DASH eating plan is a healthy eating plan that has been shown to reduce high blood pressure (hypertension). It may also reduce your risk for type 2 diabetes, heart disease, and stroke. The DASH eating plan may also help with weight loss. What are tips for following this plan? General guidelines  Avoid eating more than 2,300 mg (milligrams) of salt (sodium) a day. If you have hypertension, you may need to reduce your sodium intake to 1,500 mg a day.  Limit alcohol intake to no more than 1 drink a day for nonpregnant women and 2 drinks a day for men. One drink equals 12 oz of beer, 5 oz of wine, or 1 oz of hard liquor.  Work with your health care provider to maintain a healthy body weight or to lose weight. Ask what an ideal weight is for you.  Get at least 30 minutes of exercise that causes your heart to beat faster (aerobic exercise) most days of the week. Activities may include walking, swimming, or biking.  Work with your health care provider or diet and nutrition specialist (dietitian) to adjust your eating plan to your individual calorie needs. Reading food labels  Check food labels for the amount of sodium per serving. Choose foods with less than 5 percent of the Daily Value of sodium. Generally, foods with less than 300 mg of sodium per serving fit into this eating plan.  To find whole grains, look for the word "whole" as the first word in the ingredient list. Shopping  Buy products labeled as "low-sodium" or "no salt added."  Buy fresh foods. Avoid canned foods and premade or frozen  meals. Cooking  Avoid adding salt when cooking. Use salt-free seasonings or herbs instead of table salt or sea salt. Check with your health care provider or pharmacist before using salt substitutes.  Do not fry foods. Cook foods using healthy methods such as baking, boiling, grilling, and broiling instead.  Cook with heart-healthy oils, such as olive, canola, soybean, or sunflower oil. Meal planning   Eat a balanced diet that includes: ? 5 or more servings of fruits and vegetables each day. At each meal, try to fill half of your plate with fruits and vegetables. ? Up to 6-8 servings of whole grains each day. ? Less than 6 oz of lean meat, poultry, or fish each day. A 3-oz serving of meat is about the same size as a deck of cards. One egg equals 1 oz. ? 2 servings of low-fat dairy each day. ? A serving of nuts, seeds, or beans 5 times each week. ? Heart-healthy fats. Healthy fats called Omega-3 fatty acids are found in foods such as flaxseeds and coldwater fish, like sardines, salmon, and mackerel.  Limit how much you eat of the following: ? Canned or prepackaged foods. ? Food that is high in trans fat, such as fried foods. ? Food that is high in saturated fat, such as fatty meat. ? Sweets, desserts, sugary drinks, and other foods with added sugar. ? Full-fat dairy products.  Do not salt foods before eating.  Try to eat at least 2 vegetarian meals each week.  Eat more home-cooked food and  less restaurant, buffet, and fast food.  When eating at a restaurant, ask that your food be prepared with less salt or no salt, if possible. What foods are recommended? The items listed may not be a complete list. Talk with your dietitian about what dietary choices are best for you. Grains Whole-grain or whole-wheat bread. Whole-grain or whole-wheat pasta. Brown rice. Orpah Cobb. Bulgur. Whole-grain and low-sodium cereals. Pita bread. Low-fat, low-sodium crackers. Whole-wheat flour  tortillas. Vegetables Fresh or frozen vegetables (raw, steamed, roasted, or grilled). Low-sodium or reduced-sodium tomato and vegetable juice. Low-sodium or reduced-sodium tomato sauce and tomato paste. Low-sodium or reduced-sodium canned vegetables. Fruits All fresh, dried, or frozen fruit. Canned fruit in natural juice (without added sugar). Meat and other protein foods Skinless chicken or [place]. Ground chicken or [place]. Pork with fat trimmed off. Fish and seafood. Egg whites. Dried beans, peas, or lentils. Unsalted nuts, nut butters, and seeds. Unsalted canned beans. Lean cuts of beef with fat trimmed off. Low-sodium, lean deli meat. Dairy Low-fat (1%) or fat-free (skim) milk. Fat-free, low-fat, or reduced-fat cheeses. Nonfat, low-sodium ricotta or cottage cheese. Low-fat or nonfat yogurt. Low-fat, low-sodium cheese. Fats and oils Soft margarine without trans fats. Vegetable oil. Low-fat, reduced-fat, or light mayonnaise and salad dressings (reduced-sodium). Canola, safflower, olive, soybean, and sunflower oils. Avocado. Seasoning and other foods Herbs. Spices. Seasoning mixes without salt. Unsalted popcorn and pretzels. Fat-free sweets. What foods are not recommended? The items listed may not be a complete list. Talk with your dietitian about what dietary choices are best for you. Grains Baked goods made with fat, such as croissants, muffins, or some breads. Dry pasta or rice meal packs. Vegetables Creamed or fried vegetables. Vegetables in a cheese sauce. Regular canned vegetables (not low-sodium or reduced-sodium). Regular canned tomato sauce and paste (not low-sodium or reduced-sodium). Regular tomato and vegetable juice (not low-sodium or reduced-sodium). Rosita Fire. Olives. Fruits Canned fruit in a light or heavy syrup. Fried fruit. Fruit in cream or butter sauce. Meat and other protein foods Fatty cuts of meat. Ribs. Fried meat. Tomasa Blase. Sausage. Bologna and other processed lunch meats.  Salami. Fatback. Hotdogs. Bratwurst. Salted nuts and seeds. Canned beans with added salt. Canned or smoked fish. Whole eggs or egg yolks. Chicken or [place] with skin. Dairy Whole or 2% milk, cream, and half-and-half. Whole or full-fat cream cheese. Whole-fat or sweetened yogurt. Full-fat cheese. Nondairy creamers. Whipped toppings. Processed cheese and cheese spreads. Fats and oils Butter. Stick margarine. Lard. Shortening. Ghee. Bacon fat. Tropical oils, such as coconut, palm kernel, or palm oil. Seasoning and other foods Salted popcorn and pretzels. Onion salt, garlic salt, seasoned salt, table salt, and sea salt. Worcestershire sauce. Tartar sauce. Barbecue sauce. Teriyaki sauce. Soy sauce, including reduced-sodium. Steak sauce. Canned and packaged gravies. Fish sauce. Oyster sauce. Cocktail sauce. Horseradish that you find on the shelf. Ketchup. Mustard. Meat flavorings and tenderizers. Bouillon cubes. Hot sauce and Tabasco sauce. Premade or packaged marinades. Premade or packaged taco seasonings. Relishes. Regular salad dressings. Where to find more information:  National Heart, Lung, and Blood Institute: PopSteam.is  American Heart Association: www.heart.org Summary  The DASH eating plan is a healthy eating plan that has been shown to reduce high blood pressure (hypertension). It may also reduce your risk for type 2 diabetes, heart disease, and stroke.  With the DASH eating plan, you should limit salt (sodium) intake to 2,300 mg a day. If you have hypertension, you may need to reduce your sodium intake to 1,500 mg a day.  When on the DASH eating plan, aim to eat more fresh fruits and vegetables, whole grains, lean proteins, low-fat dairy, and heart-healthy fats.  Work with your health care provider or diet and nutrition specialist (dietitian) to adjust your eating plan to your individual calorie needs. This information is not intended to replace advice given to you by your health  care provider. Make sure you discuss any questions you have with your health care provider. Document Released: 03/24/2011 Document Revised: 03/28/2016 Document Reviewed: 03/28/2016 Elsevier Interactive Patient Education  [employer].  Please be sure medication list is accurate. If a new problem present, please set up appointment sooner than planned today.

## 2017-08-29 NOTE — Progress Notes (Addendum)
HPI:   [name] is a 72 y.o. female, who is here today with her sister for follow up.   Concerned about elevated BP during recent ER visit. BP 174/70, 158/70. Son checked BP's at home a couple times elevated :177/101. History of hypertension, currently she is on nonpharmacologic treatment.  Denies headache, visual changes, chest pain, dyspnea, palpitation, claudication, focal weakness, or edema.    Last eye exam 2 months ago.  She is not following low salt diet. She took antihypertensive medication in the past, she does not reclal name.  Lab Results  Component Value Date   CREATININE 0.99 06/26/2017   BUN 18 06/26/2017   NA 137 06/26/2017   K 3.1 (L) 06/26/2017   CL 104 06/26/2017   CO2 24 06/26/2017    She has pruritic rash under breast, it has been intermittent for years. OTC antifungal cream, which usually helps. No sick contact. No new medication or detergent. No chills, fever, myalgias. Probably has been stable overall.   Review of Systems  Constitutional: Negative for activity change, appetite change, fatigue and fever.  HENT: Negative for mouth sores, nosebleeds and trouble swallowing.   Eyes: Negative for redness and visual disturbance.  Respiratory: Negative for cough, shortness of breath and wheezing.   Cardiovascular: Negative for chest pain, palpitations and leg swelling.  Gastrointestinal: Negative for abdominal pain, nausea and vomiting.       Negative for changes in bowel habits.  Genitourinary: Negative for decreased urine volume and hematuria.  Musculoskeletal: Negative for myalgias.  Skin: Positive for rash. Negative for wound.  Neurological: Negative for syncope, weakness and headaches.  Psychiatric/Behavioral: Negative for confusion. The patient is nervous/anxious.      Current Outpatient Medications on File Prior to Visit  Medication Sig Dispense Refill  . aspirin EC 81 MG tablet Take 1 tablet (81 mg total) by mouth  daily. 30 tablet 0  . atorvastatin (LIPITOR) 20 MG tablet Take 1 tablet (20 mg total) by mouth daily at 6 PM. 30 tablet 2  . folic acid (FOLVITE) 1 MG tablet Take 1 tablet (1 mg total) by mouth daily. 90 tablet 0  . polyethylene glycol (MIRALAX / GLYCOLAX) packet Take 17 g by mouth daily. 14 each 0  . thiamine (VITAMIN B-1) 100 MG tablet Take 1 tablet (100 mg total) by mouth daily. 90 tablet 0   No current facility-administered medications on file prior to visit.      Past Medical History:  Diagnosis Date  . Anxiety   . Cardiomyopathy (HCC) 08/2016   EF 35% with suggestion of anterior wall motion abnormality. Cannot exclude ischemic versus Takotsubo  . Chicken pox   . Chronic kidney disease    polycystic  . Dehydration    recently hosp  . Depression   . History of kidney stones    cyst on kidney  . Hypertension   . Hypokalemia    Allergies  Allergen Reactions  . No Known Allergies     Social History   Socioeconomic History  . Marital status: Single    Spouse name: Not on file  . Number of children: Not on file  . Years of education: Not on file  . Highest education level: Not on file  Occupational History  . Not on file  Social Needs  . Financial resource strain: Not on file  . Food insecurity:    Worry: Not on file    Inability: Not on file  . Transportation  needs:    Medical: Not on file    Non-medical: Not on file  Tobacco Use  . Smoking status: Never Smoker  . Smokeless tobacco: Never Used  Substance and Sexual Activity  . Alcohol use: No  . Drug use: No  . Sexual activity: Not Currently  Lifestyle  . Physical activity:    Days per week: Not on file    Minutes per session: Not on file  . Stress: Not on file  Relationships  . Social connections:    Talks on phone: Not on file    Gets together: Not on file    Attends religious service: Not on file    Active member of club or organization: Not on file    Attends meetings of clubs or organizations:  Not on file    Relationship status: Not on file  Other Topics Concern  . Not on file  Social History Narrative  . Not on file    Vitals:   08/29/17 1435 08/29/17 1454  BP: 140/88 (!) 165/95  Pulse: 81   Resp: 12   Temp: 97.9 F (36.6 C)   SpO2: 97%    Body mass index is 23.62 kg/m.    Physical Exam  Nursing note and vitals reviewed. Constitutional: She is oriented to person, place, and time. She appears well-developed and well-nourished. No distress.  HENT:  Head: Normocephalic and atraumatic.  Mouth/Throat: Oropharynx is clear and moist and mucous membranes are normal.  Eyes: Pupils are equal, round, and reactive to light. Conjunctivae are normal.  Cardiovascular: Normal rate and regular rhythm.  Occasional extrasystoles (x 2 in one minute) are present.  No murmur heard. Pulses:      Dorsalis pedis pulses are 2+ on the right side, and 2+ on the left side.  Respiratory: Effort normal and breath sounds normal. No respiratory distress.  GI: Soft. She exhibits no mass. There is no tenderness.  Musculoskeletal: She exhibits no edema.  Lymphadenopathy:    She has no cervical adenopathy.  Neurological: She is alert and oriented to person, place, and time. She has normal strength.  Otherwise stable gait with no assistance.  Skin: Skin is warm. Rash noted. Rash is maculopapular. No erythema.  Under breast bilateral. No tender and no induration.  Psychiatric: Her mood appears anxious.  Well groomed, good eye contact.      ASSESSMENT AND PLAN:   [name] was seen today for follow-up.  Orders Placed This Encounter  Procedures  . Potassium    HTN (hypertension) Not well controlled. Possible complications of elevated BP discussed. Amlodipine 5 mg added today. F/U in 4 weeks.   Hypokalemia Further recommendations will be given according to lab results.  Intertrigo  Chronic. Antibacterial soap to use daily. Try to keep area dry. Lotrisone cream  to apply twice daily as needed for up to 14 days when symptomatic.  Nystatin powder to use daily as needed. Good bra support may also help. Monitor for signs of overlap bacterial infection. Follow-up as needed.  - clotrimazole-betamethasone (LOTRISONE) cream; Apply 1 application topically 2 (two) times daily.  Dispense: 30 g; Refill: 1 - nystatin (NYSTATIN) powder; Apply topically 4 (four) times daily.  Dispense: 15 g; Refill: 0    -[name] was advised to return sooner than planned today if new concerns arise.       Jearline Hirschhorn G. [place], MD  [facility]. [place] office.

## 2017-08-29 NOTE — Assessment & Plan Note (Addendum)
Not well controlled. Possible complications of elevated BP discussed. Amlodipine 5 mg added today. Low-salt/DASH diet recommended. F/U in 4 weeks.

## 2017-08-29 NOTE — Assessment & Plan Note (Addendum)
Further recommendations will be given according to lab results. 

## 2017-08-30 ENCOUNTER — Encounter: Payer: Self-pay | Admitting: Family Medicine

## 2017-09-19 ENCOUNTER — Other Ambulatory Visit: Payer: Self-pay | Admitting: Family Medicine

## 2017-09-26 ENCOUNTER — Ambulatory Visit (INDEPENDENT_AMBULATORY_CARE_PROVIDER_SITE_OTHER): Payer: Medicare Other | Admitting: Family Medicine

## 2017-09-26 ENCOUNTER — Encounter: Payer: Self-pay | Admitting: Family Medicine

## 2017-09-26 VITALS — BP 130/75 | HR 96 | Temp 98.4°F | Resp 12 | Ht 61.0 in | Wt 127.2 lb

## 2017-09-26 DIAGNOSIS — I10 Essential (primary) hypertension: Secondary | ICD-10-CM | POA: Diagnosis not present

## 2017-09-26 DIAGNOSIS — R600 Localized edema: Secondary | ICD-10-CM | POA: Diagnosis not present

## 2017-09-26 MED ORDER — AMLODIPINE BESYLATE 5 MG PO TABS: 5.0000 mg | ORAL_TABLET | Freq: Every day | ORAL | 1 refills | Status: DC

## 2017-09-26 NOTE — Assessment & Plan Note (Signed)
Here today adequately controlled,re-checked 130/75. No changes in current management. Side effects of Amlodipine discussed. Monitor BP at home. Low salt diet recommended. Eye exam current.

## 2017-09-26 NOTE — Patient Instructions (Addendum)
A few things to remember from today's visit:   Essential hypertension - Plan: amLODipine (NORVASC) 5 MG tablet  Bilateral lower extremity edema   DASH Eating Plan DASH stands for "Dietary Approaches to Stop Hypertension." The DASH eating plan is a healthy eating plan that has been shown to reduce high blood pressure (hypertension). It may also reduce your risk for type 2 diabetes, heart disease, and stroke. The DASH eating plan may also help with weight loss. What are tips for following this plan? General guidelines  Avoid eating more than 2,300 mg (milligrams) of salt (sodium) a day. If you have hypertension, you may need to reduce your sodium intake to 1,500 mg a day.  Limit alcohol intake to no more than 1 drink a day for nonpregnant women and 2 drinks a day for men. One drink equals 12 oz of beer, 5 oz of wine, or 1 oz of hard liquor.  Work with your health care provider to maintain a healthy body weight or to lose weight. Ask what an ideal weight is for you.  Get at least 30 minutes of exercise that causes your heart to beat faster (aerobic exercise) most days of the week. Activities may include walking, swimming, or biking.  Work with your health care provider or diet and nutrition specialist (dietitian) to adjust your eating plan to your individual calorie needs. Reading food labels  Check food labels for the amount of sodium per serving. Choose foods with less than 5 percent of the Daily Value of sodium. Generally, foods with less than 300 mg of sodium per serving fit into this eating plan.  To find whole grains, look for the word "whole" as the first word in the ingredient list. Shopping  Buy products labeled as "low-sodium" or "no salt added."  Buy fresh foods. Avoid canned foods and premade or frozen meals. Cooking  Avoid adding salt when cooking. Use salt-free seasonings or herbs instead of table salt or sea salt. Check with your health care provider or pharmacist  before using salt substitutes.  Do not fry foods. Cook foods using healthy methods such as baking, boiling, grilling, and broiling instead.  Cook with heart-healthy oils, such as olive, canola, soybean, or sunflower oil. Meal planning   Eat a balanced diet that includes: ? 5 or more servings of fruits and vegetables each day. At each meal, try to fill half of your plate with fruits and vegetables. ? Up to 6-8 servings of whole grains each day. ? Less than 6 oz of lean meat, poultry, or fish each day. A 3-oz serving of meat is about the same size as a deck of cards. One egg equals 1 oz. ? 2 servings of low-fat dairy each day. ? A serving of nuts, seeds, or beans 5 times each week. ? Heart-healthy fats. Healthy fats called Omega-3 fatty acids are found in foods such as flaxseeds and coldwater fish, like sardines, salmon, and mackerel.  Limit how much you eat of the following: ? Canned or prepackaged foods. ? Food that is high in trans fat, such as fried foods. ? Food that is high in saturated fat, such as fatty meat. ? Sweets, desserts, sugary drinks, and other foods with added sugar. ? Full-fat dairy products.  Do not salt foods before eating.  Try to eat at least 2 vegetarian meals each week.  Eat more home-cooked food and less restaurant, buffet, and fast food.  When eating at a restaurant, ask that your food be prepared with  less salt or no salt, if possible. What foods are recommended? The items listed may not be a complete list. Talk with your dietitian about what dietary choices are best for you. Grains Whole-grain or whole-wheat bread. Whole-grain or whole-wheat pasta. Brown rice. [place]. Bulgur. Whole-grain and low-sodium cereals. Pita bread. Low-fat, low-sodium crackers. Whole-wheat flour tortillas. Vegetables Fresh or frozen vegetables (raw, steamed, roasted, or grilled). Low-sodium or reduced-sodium tomato and vegetable juice. Low-sodium or reduced-sodium tomato  sauce and tomato paste. Low-sodium or reduced-sodium canned vegetables. Fruits All fresh, dried, or frozen fruit. Canned fruit in natural juice (without added sugar). Meat and other protein foods Skinless chicken or [place]. Ground chicken or [place]. Pork with fat trimmed off. Fish and seafood. Egg whites. Dried beans, peas, or lentils. Unsalted nuts, nut butters, and seeds. Unsalted canned beans. Lean cuts of beef with fat trimmed off. Low-sodium, lean deli meat. Dairy Low-fat (1%) or fat-free (skim) milk. Fat-free, low-fat, or reduced-fat cheeses. Nonfat, low-sodium ricotta or cottage cheese. Low-fat or nonfat yogurt. Low-fat, low-sodium cheese. Fats and oils Soft margarine without trans fats. Vegetable oil. Low-fat, reduced-fat, or light mayonnaise and salad dressings (reduced-sodium). Canola, safflower, olive, soybean, and sunflower oils. Avocado. Seasoning and other foods Herbs. Spices. Seasoning mixes without salt. Unsalted popcorn and pretzels. Fat-free sweets. What foods are not recommended? The items listed may not be a complete list. Talk with your dietitian about what dietary choices are best for you. Grains Baked goods made with fat, such as croissants, muffins, or some breads. Dry pasta or rice meal packs. Vegetables Creamed or fried vegetables. Vegetables in a cheese sauce. Regular canned vegetables (not low-sodium or reduced-sodium). Regular canned tomato sauce and paste (not low-sodium or reduced-sodium). Regular tomato and vegetable juice (not low-sodium or reduced-sodium). Angie Fava. Olives. Fruits Canned fruit in a light or heavy syrup. Fried fruit. Fruit in cream or butter sauce. Meat and other protein foods Fatty cuts of meat. Ribs. Fried meat. Berniece Salines. Sausage. Bologna and other processed lunch meats. Salami. Fatback. Hotdogs. Bratwurst. Salted nuts and seeds. Canned beans with added salt. Canned or smoked fish. Whole eggs or egg yolks. Chicken or [place] with skin. Dairy Whole  or 2% milk, cream, and half-and-half. Whole or full-fat cream cheese. Whole-fat or sweetened yogurt. Full-fat cheese. Nondairy creamers. Whipped toppings. Processed cheese and cheese spreads. Fats and oils Butter. Stick margarine. Lard. Shortening. Ghee. Bacon fat. Tropical oils, such as coconut, palm kernel, or palm oil. Seasoning and other foods Salted popcorn and pretzels. Onion salt, garlic salt, seasoned salt, table salt, and sea salt. Worcestershire sauce. Tartar sauce. Barbecue sauce. Teriyaki sauce. Soy sauce, including reduced-sodium. Steak sauce. Canned and packaged gravies. Fish sauce. Oyster sauce. Cocktail sauce. Horseradish that you find on the shelf. Ketchup. Mustard. Meat flavorings and tenderizers. Bouillon cubes. Hot sauce and Tabasco sauce. Premade or packaged marinades. Premade or packaged taco seasonings. Relishes. Regular salad dressings. Where to find more information:  National Heart, Lung, and [place]: https://wilson-eaton.com/  American Heart Association: www.heart.org Summary  The DASH eating plan is a healthy eating plan that has been shown to reduce high blood pressure (hypertension). It may also reduce your risk for type 2 diabetes, heart disease, and stroke.  With the DASH eating plan, you should limit salt (sodium) intake to 2,300 mg a day. If you have hypertension, you may need to reduce your sodium intake to 1,500 mg a day.  When on the DASH eating plan, aim to eat more fresh fruits and vegetables, whole grains, lean proteins,  low-fat dairy, and heart-healthy fats.  Work with your health care provider or diet and nutrition specialist (dietitian) to adjust your eating plan to your individual calorie needs. This information is not intended to replace advice given to you by your health care provider. Make sure you discuss any questions you have with your health care provider. Document Released: 03/24/2011 Document Revised: 03/28/2016 Document Reviewed:  03/28/2016 Elsevier Interactive Patient Education  [employer].  Please be sure medication list is accurate. If a new problem present, please set up appointment sooner than planned today.

## 2017-09-26 NOTE — Progress Notes (Signed)
HPI:   Colleen Davis is a 72 y.o. female, who is here today with her sister to follow on recent OV.   She was seen on 08/29/17 because of worsening hypertension. Started on Amlodipine 5 mg daily. Home BP readings: 150's/80's.She  Has not checked in 2-3 weeks.   She has not been consistent with low salt diet.  She lives with her son and daughter-in-law and according to her sister, they do not cook for her.  They usually prepare frozen meals.  Lab Results  Component Value Date   CREATININE 0.99 06/26/2017   BUN 18 06/26/2017   NA 137 06/26/2017   K 4.0 08/29/2017   CL 104 06/26/2017   CO2 24 06/26/2017    Denies severe/frequent headache, visual changes, chest pain, dyspnea, palpitation, claudication,or  focal weakness.   Lower extremity edema, worse at the end of the day. No leg pain, erythema, or skin lesions. Alleviated by elevation.    Review of Systems  Constitutional: Negative for activity change, appetite change, fatigue and fever.  HENT: Negative for mouth sores, nosebleeds and trouble swallowing.   Eyes: Negative for redness and visual disturbance.  Respiratory: Negative for cough, shortness of breath and wheezing.   Cardiovascular: Positive for leg swelling. Negative for chest pain and palpitations.  Gastrointestinal: Negative for abdominal pain, nausea and vomiting.       Negative for changes in bowel habits.  Genitourinary: Negative for decreased urine volume, dysuria and hematuria.  Neurological: Negative for syncope, weakness and headaches.  Psychiatric/Behavioral: Negative for confusion. The patient is nervous/anxious.       Current Outpatient Medications on File Prior to Visit  Medication Sig Dispense Refill  . aspirin EC 81 MG tablet Take 1 tablet (81 mg total) by mouth daily. 30 tablet 0  . atorvastatin (LIPITOR) 20 MG tablet TAKE 1 TABLET (20 MG TOTAL) BY MOUTH DAILY AT 6 PM. 30 tablet 2  . clotrimazole-betamethasone (LOTRISONE) cream  Apply 1 application topically 2 (two) times daily. 30 g 1  . folic acid (FOLVITE) 1 MG tablet Take 1 tablet (1 mg total) by mouth daily. 90 tablet 0  . nystatin (NYSTATIN) powder Apply topically 4 (four) times daily. 15 g 0  . polyethylene glycol (MIRALAX / GLYCOLAX) packet Take 17 g by mouth daily. 14 each 0  . thiamine (VITAMIN B-1) 100 MG tablet Take 1 tablet (100 mg total) by mouth daily. 90 tablet 0   No current facility-administered medications on file prior to visit.      Past Medical History:  Diagnosis Date  . Anxiety   . Cardiomyopathy (HCC) 08/2016   EF 35% with suggestion of anterior wall motion abnormality. Cannot exclude ischemic versus Takotsubo  . Chicken pox   . Chronic kidney disease    polycystic  . Dehydration    recently hosp  . Depression   . History of kidney stones    cyst on kidney  . Hypertension   . Hypokalemia    Allergies  Allergen Reactions  . No Known Allergies     Social History   Socioeconomic History  . Marital status: Single    Spouse name: Not on file  . Number of children: Not on file  . Years of education: Not on file  . Highest education level: Not on file  Occupational History  . Not on file  Social Needs  . Financial resource strain: Not on file  . Food insecurity:    Worry: Not  on file    Inability: Not on file  . Transportation needs:    Medical: Not on file    Non-medical: Not on file  Tobacco Use  . Smoking status: Never Smoker  . Smokeless tobacco: Never Used  Substance and Sexual Activity  . Alcohol use: No  . Drug use: No  . Sexual activity: Not Currently  Lifestyle  . Physical activity:    Days per week: Not on file    Minutes per session: Not on file  . Stress: Not on file  Relationships  . Social connections:    Talks on phone: Not on file    Gets together: Not on file    Attends religious service: Not on file    Active member of club or organization: Not on file    Attends meetings of clubs or  organizations: Not on file    Relationship status: Not on file  Other Topics Concern  . Not on file  Social History Narrative  . Not on file    Vitals:   09/26/17 1451 09/26/17 1519  BP: 140/80 130/75  Pulse: 96   Resp: 12   Temp: 98.4 F (36.9 C)   SpO2: 97%    Body mass index is 24.04 kg/m.   Physical Exam  Nursing note and vitals reviewed. Constitutional: She is oriented to person, place, and time. She appears well-developed and well-nourished. No distress.  HENT:  Head: Normocephalic and atraumatic.  Mouth/Throat: Oropharynx is clear and moist and mucous membranes are normal.  Eyes: Pupils are equal, round, and reactive to light. Conjunctivae and EOM are normal.  Cardiovascular: Normal rate and regular rhythm.  No murmur heard. Pulses:      Dorsalis pedis pulses are 2+ on the right side, and 2+ on the left side.  Respiratory: Effort normal and breath sounds normal. No respiratory distress.  Musculoskeletal: She exhibits edema (1+ pitting LE pedall and peri ankle edema, bilateral.).  Lymphadenopathy:    She has no cervical adenopathy.  Neurological: She is alert and oriented to person, place, and time. She has normal strength.  Mildly unstable gait with no assistance.  Skin: Skin is warm. No erythema.  Psychiatric: She has a normal mood and affect.  Well groomed, good eye contact.    ASSESSMENT AND PLAN:  Colleen Davis was seen today for follow-up.  Diagnoses and all orders for this visit:  Bilateral lower extremity edema  Possible etiology discussed. Amlodipine could be exacerbating problem. ?  Vein disease. Adequate skin care. She will continue monitoring and if it gets worse we may need to decrease dose of amlodipine.   HTN (hypertension) Here today adequately controlled,re-checked 130/75. No changes in current management. Side effects of Amlodipine discussed. Monitor BP at home. Low salt diet recommended. Eye exam current.  F/U in 5-6  months,before if needed.       Betty G. [place], MD  [facility]. [place] office.

## 2017-10-30 ENCOUNTER — Ambulatory Visit (INDEPENDENT_AMBULATORY_CARE_PROVIDER_SITE_OTHER): Payer: Medicare Other | Admitting: Family Medicine

## 2017-10-30 ENCOUNTER — Encounter: Payer: Self-pay | Admitting: Family Medicine

## 2017-10-30 VITALS — BP 126/78 | HR 100 | Temp 98.2°F | Resp 12 | Ht 61.0 in | Wt 125.2 lb

## 2017-10-30 DIAGNOSIS — I10 Essential (primary) hypertension: Secondary | ICD-10-CM

## 2017-10-30 DIAGNOSIS — E876 Hypokalemia: Secondary | ICD-10-CM | POA: Diagnosis not present

## 2017-10-30 DIAGNOSIS — R600 Localized edema: Secondary | ICD-10-CM

## 2017-10-30 MED ORDER — HYDROCHLOROTHIAZIDE 25 MG PO TABS: 25.0000 mg | ORAL_TABLET | Freq: Every day | ORAL | 1 refills | Status: DC

## 2017-10-30 MED ORDER — AMLODIPINE BESYLATE 2.5 MG PO TABS: 2.5000 mg | ORAL_TABLET | Freq: Every day | ORAL | 1 refills | Status: DC

## 2017-10-30 NOTE — Assessment & Plan Note (Signed)
BP well controlled. Because amlodipine may be aggravating lower extremity edema, we will decrease dose from 5 mg to 2.5 mg. HCTZ 25 mg daily added.  We discussed some side effects of medications. Continue monitoring BP at home. BP check in 3 to 4 weeks. BMP in 3 to 4 weeks.

## 2017-10-30 NOTE — Assessment & Plan Note (Signed)
For now I recommend continuing K+ containing food intake. We discussed some side effects of HCTZ. BMP in 3 to 4 weeks.

## 2017-10-30 NOTE — Assessment & Plan Note (Signed)
We discussed possible etiologies, including vein disease and medications. Amlodipine 5 mg could be aggravating problem, so we will decrease dose to 2.5 mg. Lower extremity elevation above waist level and compression stockings will also help. Follow-up in 3 to 4 months.

## 2017-10-30 NOTE — Patient Instructions (Signed)
A few things to remember from today's visit:   Essential hypertension - Plan: amLODipine (NORVASC) 2.5 MG tablet, hydrochlorothiazide (HYDRODIURIL) 25 MG tablet  Bilateral lower extremity edema - Plan: hydrochlorothiazide (HYDRODIURIL) 25 MG tablet  Hypokalemia  Amlodipine decreased from 5 mg to 2.5 mg. Hydrochlorothiazide 25 mg added today. Potassium containing foods. We will check potassium in 3 to 4 weeks. Blood pressure check with a nurse visit in 3 to 4 weeks. I will see you back in 3 to 4 months.    Please be sure medication list is accurate. If a new problem present, please set up appointment sooner than planned today.

## 2017-10-30 NOTE — Progress Notes (Signed)
HPI:  Chief Complaint  Patient presents with  . Foot Swelling    BILATERAL    Colleen Davis is a 72 y.o. female, who is here today with her sister complaining of worsening LE edema for the past 3 weeks.  She was last seen on 09/26/2017. Lower extremity edema is worse at the end of the day, alleviated by LE elevation.  Greatly better first thing in the morning when she first wakes up. She denies pain or local erythema. She is now on hormonal therapy and no history of long travel or recent surgery.  She has not noted palpitations, chest pain, or dyspnea. Negative for decreased urine output,foam in urine,or gross hematuria.  Hypertension, currently she is on amlodipine 5 mg daily. She is not checking BP at home.  Denies headache, visual changes, claudication, focal weakness.  She has not been consistent with low salt diet. She lives with her son and daughter in law,usually prepare frozen meals.   Lab Results  Component Value Date   CREATININE 0.99 06/26/2017   BUN 18 06/26/2017   NA 137 06/26/2017   K 4.0 08/29/2017   CL 104 06/26/2017   CO2 24 06/26/2017   She has had hypokalemia in the past, currently she is on nonpharmacologic treatment. Negative for leg cramps. She is drinking orange juice daily.    Review of Systems  Constitutional: Positive for fatigue. Negative for activity change, appetite change and fever.  HENT: Negative for mouth sores, nosebleeds and trouble swallowing.   Respiratory: Negative for cough, shortness of breath and wheezing.   Cardiovascular: Positive for leg swelling. Negative for chest pain and palpitations.  Gastrointestinal: Negative for abdominal pain, nausea and vomiting.       Negative for changes in bowel habits.  Genitourinary: Negative for decreased urine volume, dysuria and hematuria.  Musculoskeletal: Positive for gait problem (stable). Negative for myalgias.  Neurological: Negative for syncope, weakness, numbness and  headaches.  Psychiatric/Behavioral: Negative for confusion. The patient is nervous/anxious.       Current Outpatient Medications on File Prior to Visit  Medication Sig Dispense Refill  . aspirin EC 81 MG tablet Take 1 tablet (81 mg total) by mouth daily. 30 tablet 0  . atorvastatin (LIPITOR) 20 MG tablet TAKE 1 TABLET (20 MG TOTAL) BY MOUTH DAILY AT 6 PM. 30 tablet 2  . clotrimazole-betamethasone (LOTRISONE) cream Apply 1 application topically 2 (two) times daily. 30 g 1  . folic acid (FOLVITE) 1 MG tablet Take 1 tablet (1 mg total) by mouth daily. 90 tablet 0  . nystatin (NYSTATIN) powder Apply topically 4 (four) times daily. 15 g 0  . polyethylene glycol (MIRALAX / GLYCOLAX) packet Take 17 g by mouth daily. 14 each 0  . thiamine (VITAMIN B-1) 100 MG tablet Take 1 tablet (100 mg total) by mouth daily. 90 tablet 0   No current facility-administered medications on file prior to visit.      Past Medical History:  Diagnosis Date  . Anxiety   . Cardiomyopathy (HCC) 08/2016   EF 35% with suggestion of anterior wall motion abnormality. Cannot exclude ischemic versus Takotsubo  . Chicken pox   . Chronic kidney disease    polycystic  . Dehydration    recently hosp  . Depression   . History of kidney stones    cyst on kidney  . Hypertension   . Hypokalemia    Allergies  Allergen Reactions  . No Known Allergies  Social History   Socioeconomic History  . Marital status: Single    Spouse name: Not on file  . Number of children: Not on file  . Years of education: Not on file  . Highest education level: Not on file  Occupational History  . Not on file  Social Needs  . Financial resource strain: Not on file  . Food insecurity:    Worry: Not on file    Inability: Not on file  . Transportation needs:    Medical: Not on file    Non-medical: Not on file  Tobacco Use  . Smoking status: Never Smoker  . Smokeless tobacco: Never Used  Substance and Sexual Activity  .  Alcohol use: No  . Drug use: No  . Sexual activity: Not Currently  Lifestyle  . Physical activity:    Days per week: Not on file    Minutes per session: Not on file  . Stress: Not on file  Relationships  . Social connections:    Talks on phone: Not on file    Gets together: Not on file    Attends religious service: Not on file    Active member of club or organization: Not on file    Attends meetings of clubs or organizations: Not on file    Relationship status: Not on file  Other Topics Concern  . Not on file  Social History Narrative  . Not on file    Vitals:   10/30/17 1432  BP: 126/78  Pulse: 100  Resp: 12  Temp: 98.2 F (36.8 C)  SpO2: 97%   Body mass index is 23.67 kg/m.   Physical Exam  Nursing note and vitals reviewed. Constitutional: She is oriented to person, place, and time. She appears well-developed and well-nourished. No distress.  HENT:  Head: Normocephalic and atraumatic.  Mouth/Throat: Oropharynx is clear and moist. Mucous membranes are dry (mild).  Eyes: Conjunctivae are normal.  Cardiovascular: Normal rate and regular rhythm.  No murmur heard. Pulses:      Dorsalis pedis pulses are 2+ on the right side, and 2+ on the left side.  Respiratory: Effort normal and breath sounds normal. No respiratory distress.  GI: Soft. She exhibits no mass. There is no hepatomegaly. There is no tenderness.  Musculoskeletal: She exhibits edema (2+ pedal and periankle LE edema,L>R). She exhibits no tenderness.  Lymphadenopathy:    She has no cervical adenopathy.  Neurological: She is alert and oriented to person, place, and time. She has normal strength.  Mildly unstable gait with no assistance.  Skin: Skin is warm. No rash noted. No erythema.  Psychiatric: She has a normal mood and affect.  Well groomed, good eye contact.     ASSESSMENT AND PLAN:  Colleen Davis was seen today for foot swelling.  Orders Placed This Encounter  Procedures  . Basic metabolic  panel    Standing Status:   Future    Standing Expiration Date:   01/03/2018     Bilateral lower extremity edema  Possible causes, including venous disease,medications,and systemic illness among some. I do nit think further work up is needed today, low probability of DVT. Most likely vein disease and/or Amlodipine. HCTZ started today.  Instructed about warning signs.  -     hydrochlorothiazide (HYDRODIURIL) 25 MG tablet; Take 1 tablet (25 mg total) by mouth daily.  Essential hypertension  Well controlled. Educated about importance of low salt diet. Because Amlodipine may be aggravating LE edema, we will decrease dose from 5  mg to 2.5 mg.  -     amLODipine (NORVASC) 2.5 MG tablet; Take 1 tablet (2.5 mg total) by mouth daily. -     hydrochlorothiazide (HYDRODIURIL) 25 MG tablet; Take 1 tablet (25 mg total) by mouth daily.  Hypokalemia  Side effects of HCTZ discussed. Continue intake of K+ containing foods. BMP in 3-4 weeks.      Return in about 4 months (around 03/02/2018) for 3-4 weeks nurse visit BP check and BMP.      Dicie Edelen G. [place], MD  [facility]. [place] office.

## 2017-11-04 ENCOUNTER — Encounter: Payer: Self-pay | Admitting: Family Medicine

## 2017-11-29 ENCOUNTER — Ambulatory Visit (INDEPENDENT_AMBULATORY_CARE_PROVIDER_SITE_OTHER): Payer: Medicare Other | Admitting: Family Medicine

## 2017-11-29 ENCOUNTER — Encounter: Payer: Self-pay | Admitting: Family Medicine

## 2017-11-29 VITALS — BP 124/82 | HR 84 | Temp 98.4°F | Resp 12 | Ht 61.0 in | Wt 121.5 lb

## 2017-11-29 DIAGNOSIS — Z1159 Encounter for screening for other viral diseases: Secondary | ICD-10-CM | POA: Diagnosis not present

## 2017-11-29 DIAGNOSIS — E876 Hypokalemia: Secondary | ICD-10-CM

## 2017-11-29 DIAGNOSIS — I10 Essential (primary) hypertension: Secondary | ICD-10-CM

## 2017-11-29 DIAGNOSIS — R600 Localized edema: Secondary | ICD-10-CM

## 2017-11-29 DIAGNOSIS — I5020 Unspecified systolic (congestive) heart failure: Secondary | ICD-10-CM

## 2017-11-29 LAB — BASIC METABOLIC PANEL
BUN: 16 mg/dL (ref 6–23)
CALCIUM: 10.3 mg/dL (ref 8.4–10.5)
CO2: 33 mEq/L — ABNORMAL HIGH (ref 19–32)
Chloride: 97 mEq/L (ref 96–112)
Creatinine, Ser: 1.18 mg/dL (ref 0.40–1.20)
GFR: 47.97 mL/min — AB (ref >60.00)
GLUCOSE: 98 mg/dL (ref 70–99)
Potassium: 3.2 mEq/L — ABNORMAL LOW (ref 3.5–5.1)
SODIUM: 141 meq/L (ref 135–145)

## 2017-11-29 MED ORDER — POTASSIUM CHLORIDE CRYS ER 20 MEQ PO TBCR: EXTENDED_RELEASE_TABLET | ORAL | 1 refills | Status: DC

## 2017-11-29 NOTE — Assessment & Plan Note (Signed)
Improved. Continue HCTZ 25 mg daily and elevation of lower extremities above waist level a few times per day. We discussed some side effects of diuretics.

## 2017-11-29 NOTE — Assessment & Plan Note (Signed)
Continue with intake of K+ containing food. Further recommendations will be given according to lab results.

## 2017-11-29 NOTE — Patient Instructions (Signed)
A few things to remember from today's visit:   Bilateral lower extremity edema  Essential hypertension - Plan: Basic metabolic panel  Hypokalemia - Plan: Basic metabolic panel  Encounter for hepatitis C screening test for low risk patient - Plan: Hepatitis C Antibody  No changes in current medications. Continue potassium containing foods intake. I will see her back in 6 months.  Please be sure medication list is accurate. If a new problem present, please set up appointment sooner than planned today.

## 2017-11-29 NOTE — Progress Notes (Signed)
HPI:   Colleen Davis is a 72 y.o. female, who is here today for 4 weeks follow up.   She was last seen on 10/30/2017, when she was concerned about lower extremity edema.  HTN:  Amlodipine was decreased from 5 mg to 2.5 mg daily and HCTZ 25 mg was added.  Negative for headache, visual changes, chest pain, dyspnea,claudication, focal weakness.  She is not monitoring BP at home.  Lab Results  Component Value Date   CREATININE 0.99 06/26/2017   BUN 18 06/26/2017   NA 137 06/26/2017   K 4.0 08/29/2017   CL 104 06/26/2017   CO2 24 06/26/2017      Lower extremity edema has improved. She has not noted pain or erythema. Past hx of HF, Takotsubo/stress cardiomyopathy.    Echo in 08/2016 showed LVEF 30 to 35% with severe hypokinesis of distal septal, inferior, and anterolateral walls.  A kinesis of the apex. 09/14/2016. Lexiscan stress test with LVEF 55% and normal wall motion.  No orthopnea or PND. She has not noted decreased urine output or foam  in urine.  She has not noted side effects with HCTZ.  She has history of hypokalemia, she has been on nonpharmacologic treatment for almost a year. She is drinking orange juice daily. She has not noted leg cramps or palpitations.   Review of Systems  Constitutional: Negative for activity change, appetite change, fatigue and fever.  HENT: Negative for mouth sores, nosebleeds and sore throat.   Eyes: Negative for redness and visual disturbance.  Respiratory: Negative for shortness of breath and wheezing.   Cardiovascular: Negative for chest pain and palpitations.  Gastrointestinal: Negative for abdominal pain, nausea and vomiting.       Negative for changes in bowel habits.  Genitourinary: Negative for decreased urine volume and hematuria.  Neurological: Negative for syncope, weakness and headaches.      Current Outpatient Medications on File Prior to Visit  Medication Sig Dispense Refill  . amLODipine (NORVASC)  2.5 MG tablet Take 1 tablet (2.5 mg total) by mouth daily. 90 tablet 1  . aspirin EC 81 MG tablet Take 1 tablet (81 mg total) by mouth daily. 30 tablet 0  . atorvastatin (LIPITOR) 20 MG tablet TAKE 1 TABLET (20 MG TOTAL) BY MOUTH DAILY AT 6 PM. 30 tablet 2  . clotrimazole-betamethasone (LOTRISONE) cream Apply 1 application topically 2 (two) times daily. 30 g 1  . folic acid (FOLVITE) 1 MG tablet Take 1 tablet (1 mg total) by mouth daily. 90 tablet 0  . hydrochlorothiazide (HYDRODIURIL) 25 MG tablet Take 1 tablet (25 mg total) by mouth daily. 90 tablet 1  . nystatin (NYSTATIN) powder Apply topically 4 (four) times daily. 15 g 0  . polyethylene glycol (MIRALAX / GLYCOLAX) packet Take 17 g by mouth daily. 14 each 0  . thiamine (VITAMIN B-1) 100 MG tablet Take 1 tablet (100 mg total) by mouth daily. 90 tablet 0   No current facility-administered medications on file prior to visit.      Past Medical History:  Diagnosis Date  . Anxiety   . Cardiomyopathy (HCC) 08/2016   EF 35% with suggestion of anterior wall motion abnormality. Cannot exclude ischemic versus Takotsubo  . Chicken pox   . Chronic kidney disease    polycystic  . Dehydration    recently hosp  . Depression   . History of kidney stones    cyst on kidney  . Hypertension   . Hypokalemia  Allergies  Allergen Reactions  . No Known Allergies     Social History   Socioeconomic History  . Marital status: Single    Spouse name: Not on file  . Number of children: Not on file  . Years of education: Not on file  . Highest education level: Not on file  Occupational History  . Not on file  Social Needs  . Financial resource strain: Not on file  . Food insecurity:    Worry: Not on file    Inability: Not on file  . Transportation needs:    Medical: Not on file    Non-medical: Not on file  Tobacco Use  . Smoking status: Never Smoker  . Smokeless tobacco: Never Used  Substance and Sexual Activity  . Alcohol use: No  .  Drug use: No  . Sexual activity: Not Currently  Lifestyle  . Physical activity:    Days per week: Not on file    Minutes per session: Not on file  . Stress: Not on file  Relationships  . Social connections:    Talks on phone: Not on file    Gets together: Not on file    Attends religious service: Not on file    Active member of club or organization: Not on file    Attends meetings of clubs or organizations: Not on file    Relationship status: Not on file  Other Topics Concern  . Not on file  Social History Narrative  . Not on file    Vitals:   11/29/17 1424  BP: 124/82  Pulse: 84  Resp: 12  Temp: 98.4 F (36.9 C)  SpO2: 96%   Body mass index is 22.96 kg/m.  Physical Exam  Nursing note and vitals reviewed. Constitutional: She is oriented to person, place, and time. She appears well-developed. No distress.  HENT:  Head: Normocephalic and atraumatic.  Mouth/Throat: Oropharynx is clear and moist and mucous membranes are normal.  Eyes: Conjunctivae are normal.  Cardiovascular: Normal rate. An irregular rhythm present.  No murmur heard. Pulses:      Dorsalis pedis pulses are 2+ on the right side, and 2+ on the left side.  Respiratory: Effort normal and breath sounds normal. No respiratory distress.  GI: Soft. There is no tenderness.  Musculoskeletal: She exhibits no edema or tenderness.  Lymphadenopathy:    She has no cervical adenopathy.  Neurological: She is alert and oriented to person, place, and time. She has normal strength. No cranial nerve deficit. Gait normal.  Skin: Skin is warm. No rash noted. No erythema.  Psychiatric: She has a normal mood and affect.  Well groomed, good eye contact.     ASSESSMENT AND PLAN:   Colleen Davis was seen today for 4 weeks follow-up.  Orders Placed This Encounter  Procedures  . Hep C Antibody  . Basic metabolic panel   Lab Results  Component Value Date   CREATININE 1.18 11/29/2017   BUN 16 11/29/2017   NA  141 11/29/2017   K 3.2 (L) 11/29/2017   CL 97 11/29/2017   CO2 33 (H) 11/29/2017     Bilateral lower extremity edema Improved. Continue HCTZ 25 mg daily and elevation of lower extremities above waist level a few times per day. We discussed some side effects of diuretics.   HTN (hypertension) BP is well controlled. No changes in Amlodipine 2.5 mg daily or HCTZ 25 mg daily. Continue low-salt diet. Recommend monitoring BP at home. Follow-up in 3  to 4 months.   Hypokalemia Continue with intake of K+ containing food. Further recommendations will be given according to lab results.      Betty G. [place], MD  [facility]. [place] office.

## 2017-11-29 NOTE — Assessment & Plan Note (Signed)
BP is well controlled. No changes in Amlodipine 2.5 mg daily or HCTZ 25 mg daily. Continue low-salt diet. Recommend monitoring BP at home. Follow-up in 3 to 4 months.

## 2017-11-30 LAB — HEPATITIS C ANTIBODY
HEP C AB: NONREACTIVE
SIGNAL TO CUT-OFF: 0.02 (ref <1.00)

## 2017-12-22 ENCOUNTER — Other Ambulatory Visit: Payer: Self-pay | Admitting: Family Medicine

## 2018-01-08 ENCOUNTER — Ambulatory Visit: Payer: Medicare Other | Admitting: Family Medicine

## 2018-01-10 ENCOUNTER — Other Ambulatory Visit (INDEPENDENT_AMBULATORY_CARE_PROVIDER_SITE_OTHER): Payer: Medicare Other

## 2018-01-10 ENCOUNTER — Encounter: Payer: Self-pay | Admitting: Family Medicine

## 2018-01-10 ENCOUNTER — Telehealth: Payer: Self-pay | Admitting: Family Medicine

## 2018-01-10 DIAGNOSIS — E876 Hypokalemia: Secondary | ICD-10-CM | POA: Diagnosis not present

## 2018-01-10 DIAGNOSIS — I10 Essential (primary) hypertension: Secondary | ICD-10-CM | POA: Diagnosis not present

## 2018-01-10 LAB — BASIC METABOLIC PANEL
BUN: 19 mg/dL (ref 6–23)
CALCIUM: 10 mg/dL (ref 8.4–10.5)
CO2: 31 mEq/L (ref 19–32)
CREATININE: 1.07 mg/dL (ref 0.40–1.20)
Chloride: 101 mEq/L (ref 96–112)
GFR: 53.69 mL/min — AB (ref >60.00)
GLUCOSE: 97 mg/dL (ref 70–99)
Potassium: 3.7 mEq/L (ref 3.5–5.1)
Sodium: 142 mEq/L (ref 135–145)

## 2018-01-10 NOTE — Telephone Encounter (Signed)
error 

## 2018-01-10 NOTE — Telephone Encounter (Signed)
Spoke with daughter in law - Patient made a mychart appt with Dr [place] on 9/30 for lab work.  Told her that if the patient didn't have any questions/concerns then we can change that to just a lab appt.  Daughter in law will speak to pt and call us back.

## 2018-01-15 ENCOUNTER — Encounter: Payer: Self-pay | Admitting: Family Medicine

## 2018-01-15 ENCOUNTER — Ambulatory Visit: Payer: Medicare Other | Admitting: Family Medicine

## 2018-03-18 ENCOUNTER — Other Ambulatory Visit: Payer: Self-pay | Admitting: Family Medicine

## 2018-04-25 ENCOUNTER — Other Ambulatory Visit: Payer: Self-pay | Admitting: Family Medicine

## 2018-04-25 DIAGNOSIS — R600 Localized edema: Secondary | ICD-10-CM

## 2018-04-25 DIAGNOSIS — I10 Essential (primary) hypertension: Secondary | ICD-10-CM

## 2018-05-16 ENCOUNTER — Ambulatory Visit (INDEPENDENT_AMBULATORY_CARE_PROVIDER_SITE_OTHER): Payer: Medicare Other

## 2018-05-16 ENCOUNTER — Ambulatory Visit (INDEPENDENT_AMBULATORY_CARE_PROVIDER_SITE_OTHER): Payer: Medicare Other | Admitting: Family Medicine

## 2018-05-16 ENCOUNTER — Encounter: Payer: Self-pay | Admitting: Family Medicine

## 2018-05-16 VITALS — BP 126/82 | HR 70 | Temp 98.2°F | Resp 16 | Ht 61.0 in | Wt 112.1 lb

## 2018-05-16 DIAGNOSIS — R05 Cough: Secondary | ICD-10-CM

## 2018-05-16 DIAGNOSIS — R059 Cough, unspecified: Secondary | ICD-10-CM

## 2018-05-16 DIAGNOSIS — R509 Fever, unspecified: Secondary | ICD-10-CM

## 2018-05-16 DIAGNOSIS — J181 Lobar pneumonia, unspecified organism: Secondary | ICD-10-CM | POA: Diagnosis not present

## 2018-05-16 DIAGNOSIS — J189 Pneumonia, unspecified organism: Secondary | ICD-10-CM

## 2018-05-16 MED ORDER — BENZONATATE 100 MG PO CAPS: 200.0000 mg | ORAL_CAPSULE | Freq: Two times a day (BID) | ORAL | 0 refills | Status: AC | PRN

## 2018-05-16 MED ORDER — DOXYCYCLINE HYCLATE 100 MG PO TABS: 100.0000 mg | ORAL_TABLET | Freq: Two times a day (BID) | ORAL | 0 refills | Status: AC

## 2018-05-16 NOTE — Patient Instructions (Addendum)
A few things to remember from today's visit:   Fever, unspecified fever cause - Plan: DG Chest 2 View  Community acquired pneumonia of left lower lobe of lung (HCC) - Plan: doxycycline (VIBRA-TABS) 100 MG tablet  Cough - Plan: benzonatate (TESSALON) 100 MG capsule, DG Chest 2 View  If not feeling daily improved in 3 to 4 days, I may need to see you back for follow-up. Remember the cough and congestion can last a few days and even weeks after recovery. You can take Tylenol 500 mg 4 times per day for fever.  Please be sure medication list is accurate. If a new problem present, please set up appointment sooner than planned today.

## 2018-05-16 NOTE — Progress Notes (Signed)
ACUTE VISIT  HPI:  Chief Complaint  Patient presents with  . Fever    temp was 101 lastnight, took Tylenol  . Cough    with phlem sometimes started 5 days ago  . Nasal Congestion    [name] is a 73 y.o.female here today with her sister complaining of 5 days of respiratory symptoms. Productive cough with "little" sputum, clear.  She denies hemoptysis. She has not identified exacerbating or alleviating factors.  Nasal congestion, rhinorrhea, and postnasal drainage. Postnasal drainage causes nausea.  She has not noted chest pain, dyspnea, or wheezing. Initially she has some sore throat but this has resolved.   Yesterday she started with fever 101.0 F.   Fever   This is a new problem. The current episode started yesterday. The problem occurs intermittently. The problem has been gradually worsening. The maximum temperature noted was 101 to 101.9 F. The temperature was taken using an oral thermometer. Associated symptoms include congestion, coughing, nausea (Intermittent, attributed to postnasal drainage.) and a sore throat. Pertinent negatives include no abdominal pain, chest pain, diarrhea, ear pain, headaches, muscle aches, rash, sleepiness, urinary pain, vomiting or wheezing. She has tried acetaminophen for the symptoms. The treatment provided mild relief.  Risk factors: sick contacts   Risk factors: no contaminated food, no contaminated water, no recent sickness and no recent travel     She has not noted body aches, vomiting, or urinary symptoms. No Hx of recent travel. Her son and grandchildren have been sick recently. No known insect bite.    Review of Systems  Constitutional: Positive for activity change, appetite change, fatigue and fever.  HENT: Positive for congestion, postnasal drip, rhinorrhea and sore throat. Negative for ear pain, mouth sores, sinus pressure, trouble swallowing and voice change.   Eyes: Negative for discharge, redness and  itching.  Respiratory: Positive for cough. Negative for shortness of breath and wheezing.   Cardiovascular: Negative.  Negative for chest pain.  Gastrointestinal: Positive for nausea (Intermittent, attributed to postnasal drainage.). Negative for abdominal pain, diarrhea and vomiting.  Genitourinary: Negative for decreased urine volume and dysuria.  Musculoskeletal: Negative for myalgias and neck pain.  Skin: Negative for rash.  Allergic/Immunologic: Negative for environmental allergies.  Neurological: Negative for weakness and headaches.  Hematological: Negative for adenopathy. Does not bruise/bleed easily.      Current Outpatient Medications on File Prior to Visit  Medication Sig Dispense Refill  . amLODipine (NORVASC) 2.5 MG tablet TAKE 1 TABLET BY MOUTH EVERY DAY 90 tablet 1  . aspirin EC 81 MG tablet Take 1 tablet (81 mg total) by mouth daily. 30 tablet 0  . atorvastatin (LIPITOR) 20 MG tablet TAKE 1 TABLET (20 MG TOTAL) BY MOUTH DAILY AT 6 PM. 90 tablet 0  . clotrimazole-betamethasone (LOTRISONE) cream Apply 1 application topically 2 (two) times daily. 30 g 1  . folic acid (FOLVITE) 1 MG tablet Take 1 tablet (1 mg total) by mouth daily. 90 tablet 0  . hydrochlorothiazide (HYDRODIURIL) 25 MG tablet TAKE 1 TABLET BY MOUTH EVERY DAY 90 tablet 1  . nystatin (NYSTATIN) powder Apply topically 4 (four) times daily. 15 g 0  . polyethylene glycol (MIRALAX / GLYCOLAX) packet Take 17 g by mouth daily. 14 each 0  . potassium chloride SA (K-DUR,KLOR-CON) 20 MEQ tablet 1 tablet twice daily for 7 days then continue 1 tablet daily. 90 tablet 1  . thiamine (VITAMIN B-1) 100 MG tablet Take 1 tablet (100 mg total) by  mouth daily. 90 tablet 0   No current facility-administered medications on file prior to visit.      Past Medical History:  Diagnosis Date  . Anxiety   . Cardiomyopathy (HCC) 08/2016   EF 35% with suggestion of anterior wall motion abnormality. Cannot exclude ischemic versus  Takotsubo  . Chicken pox   . Chronic kidney disease    polycystic  . Dehydration    recently hosp  . Depression   . History of kidney stones    cyst on kidney  . Hypertension   . Hypokalemia    Allergies  Allergen Reactions  . No Known Allergies     Social History   Socioeconomic History  . Marital status: Single    Spouse name: Not on file  . Number of children: Not on file  . Years of education: Not on file  . Highest education level: Not on file  Occupational History  . Not on file  Social Needs  . Financial resource strain: Not on file  . Food insecurity:    Worry: Not on file    Inability: Not on file  . Transportation needs:    Medical: Not on file    Non-medical: Not on file  Tobacco Use  . Smoking status: Never Smoker  . Smokeless tobacco: Never Used  Substance and Sexual Activity  . Alcohol use: No  . Drug use: No  . Sexual activity: Not Currently  Lifestyle  . Physical activity:    Days per week: Not on file    Minutes per session: Not on file  . Stress: Not on file  Relationships  . Social connections:    Talks on phone: Not on file    Gets together: Not on file    Attends religious service: Not on file    Active member of club or organization: Not on file    Attends meetings of clubs or organizations: Not on file    Relationship status: Not on file  Other Topics Concern  . Not on file  Social History Narrative  . Not on file    Vitals:   05/16/18 0851  BP: 126/82  Pulse: 70  Resp: 16  Temp: 98.2 F (36.8 C)  SpO2: 97%   Body mass index is 21.19 kg/m.   Physical Exam  Nursing note and vitals reviewed. Constitutional: She is oriented to person, place, and time. She appears well-developed and well-nourished. She does not appear ill. No distress.  HENT:  Head: Normocephalic and atraumatic.  Right Ear: Tympanic membrane, external ear and ear canal normal.  Left Ear: Tympanic membrane, external ear and ear canal normal.  Nose:  Rhinorrhea present. Right sinus exhibits no maxillary sinus tenderness and no frontal sinus tenderness. Left sinus exhibits no maxillary sinus tenderness and no frontal sinus tenderness.  Mouth/Throat: Oropharynx is clear and moist and mucous membranes are normal.  Eyes: Conjunctivae are normal.  Neck: No edema and no erythema present.  Cardiovascular: Normal rate and regular rhythm.  No murmur heard. Respiratory: Effort normal. No stridor. No respiratory distress. She has rales in the left lower field.  Lymphadenopathy:       Head (right side): No submandibular adenopathy present.       Head (left side): No submandibular adenopathy present.    She has no cervical adenopathy.  Neurological: She is alert and oriented to person, place, and time. She has normal strength.  Skin: Skin is warm. No rash noted. No erythema.  Psychiatric: She  has a normal mood and affect.  Well groomed, good eye contact.    ASSESSMENT AND PLAN:   Ms. Kendrah was seen today for fever, cough and nasal congestion.  Diagnoses and all orders for this visit:  Fever, unspecified fever cause New onset after 5 days of cough. We discussed possible etiologies. Rapid flu test here in the office negative.  -     DG Chest 2 View; Future  Community acquired pneumonia of left lower lobe of lung (HCC) Fine Rales heard in left base. Recommend treatment with doxycycline, we discussed side effects. She was educated about warning signs. Further recommendations will be given according to imaging results.  -     doxycycline (VIBRA-TABS) 100 MG tablet; Take 1 tablet (100 mg total) by mouth 2 (two) times daily for 7 days.  Cough Adequate hydration. Symptomatic treatment with OTC cough drops and benzonatate. Explained that cough and congestion can last a few days and even weeks after acute respiratory illness.  -     benzonatate (TESSALON) 100 MG capsule; Take 2 capsules (200 mg total) by mouth 2 (two) times daily as needed  for up to 10 days. -     DG Chest 2 View; Future    Return if symptoms worsen or fail to improve.      Tanikka Bresnan G. [place], MD  [facility]. [place] office.

## 2018-05-20 ENCOUNTER — Encounter: Payer: Self-pay | Admitting: Family Medicine

## 2018-05-26 ENCOUNTER — Emergency Department (HOSPITAL_COMMUNITY): Payer: Medicare Other

## 2018-05-26 ENCOUNTER — Other Ambulatory Visit: Payer: Self-pay | Admitting: Family Medicine

## 2018-05-26 ENCOUNTER — Encounter (HOSPITAL_COMMUNITY): Payer: Self-pay | Admitting: Emergency Medicine

## 2018-05-26 ENCOUNTER — Emergency Department (HOSPITAL_COMMUNITY)
Admission: EM | Admit: 2018-05-26 | Discharge: 2018-05-26 | Disposition: A | Discharge status: Home / Self Care | Payer: Medicare Other | Attending: Emergency Medicine | Admitting: Emergency Medicine

## 2018-05-26 DIAGNOSIS — I10 Essential (primary) hypertension: Secondary | ICD-10-CM | POA: Diagnosis not present

## 2018-05-26 DIAGNOSIS — J069 Acute upper respiratory infection, unspecified: Secondary | ICD-10-CM | POA: Insufficient documentation

## 2018-05-26 DIAGNOSIS — Z79899 Other long term (current) drug therapy: Secondary | ICD-10-CM | POA: Insufficient documentation

## 2018-05-26 DIAGNOSIS — Z7982 Long term (current) use of aspirin: Secondary | ICD-10-CM | POA: Insufficient documentation

## 2018-05-26 DIAGNOSIS — E876 Hypokalemia: Secondary | ICD-10-CM

## 2018-05-26 DIAGNOSIS — E785 Hyperlipidemia, unspecified: Secondary | ICD-10-CM | POA: Diagnosis not present

## 2018-05-26 DIAGNOSIS — B9789 Other viral agents as the cause of diseases classified elsewhere: Secondary | ICD-10-CM

## 2018-05-26 DIAGNOSIS — R05 Cough: Secondary | ICD-10-CM | POA: Diagnosis present

## 2018-05-26 MED ORDER — ONDANSETRON 4 MG PO TBDP
8.0000 mg | ORAL_TABLET | Freq: Once | ORAL | Status: AC
  Administered 2018-05-26: 8 mg via ORAL
  Filled 2018-05-26: qty 2

## 2018-05-26 MED ORDER — ONDANSETRON 8 MG PO TBDP: 8.0000 mg | ORAL_TABLET | Freq: Three times a day (TID) | ORAL | 0 refills | Status: DC | PRN

## 2018-05-26 NOTE — ED Triage Notes (Signed)
Pt reports she was recently on antibiotics but believes she still has a sinus infection, repost cough, nausea since the ABX, and casal congestion

## 2018-05-26 NOTE — ED Notes (Signed)
Patient verbalizes understanding of discharge instructions. Opportunity for questioning and answers were provided. Armband removed by staff, pt discharged from ED in wheelchair.  

## 2018-05-26 NOTE — ED Notes (Signed)
ED Provider at bedside. 

## 2018-05-26 NOTE — ED Provider Notes (Signed)
[facility]   CSN: [id number] Arrival date & time: 05/26/18  1712     History   Chief Complaint Chief Complaint  Patient presents with  . Recurrent Sinusitis    HPI Colleen Davis is a 73 y.o. female.  Patient c/o episodic, non prod cough in the past week. Symptoms acute onset, moderate, persistent. Recently saw pcp w same, and completed 7 day course doxycycline - states abx made her nauseated, but denies resolution of uri symptoms. No sore throat. +nasal congestion. No max or frontal sinus pain. Had fever last week, not today. No chest pain. No sob. No abd pain or vomiting/diarrhea.   The history is provided by the patient.    Past Medical History:  Diagnosis Date  . Anxiety   . Cardiomyopathy (HCC) 08/2016   EF 35% with suggestion of anterior wall motion abnormality. Cannot exclude ischemic versus Takotsubo  . Chicken pox   . Chronic kidney disease    polycystic  . Dehydration    recently hosp  . Depression   . History of kidney stones    cyst on kidney  . Hypertension   . Hypokalemia     Patient Active Problem List   Diagnosis Date Noted  . Bilateral lower extremity edema 10/30/2017  . Intertrigo 08/29/2017  . Frailty syndrome in geriatric patient 01/12/2017  . Cachexia (HCC) 11/02/2016  . Unstable gait 11/02/2016  . Preoperative cardiovascular examination 09/07/2016  . Takotsubo cardiomyopathy - resolved 09/07/2016  . Abnormal electrocardiogram (ECG) (EKG) 09/07/2016  . Hypotension due to drugs 09/07/2016  . Hypokalemia 08/17/2016  . Symptomatic cholelithiasis 08/17/2016  . Abnormal urinalysis 08/17/2016  . Mild concentric left ventricular hypertrophy (LVH) 08/17/2016  . Severe protein-calorie malnutrition (HCC) 08/17/2016  . Visual hallucinations 08/17/2016  . Syncope and collapse 11/17/2011  . HTN (hypertension) 11/17/2011  . Tinnitus of right ear 11/17/2011  . Hyperlipidemia with target LDL less  than 70 11/17/2011    Past Surgical History:  Procedure Laterality Date  . DILATION AND CURETTAGE OF UTERUS    . NM MYOVIEW LTD  09/14/2016   Normal EF 55-65%. Normal wall motion. No evidence of ischemia or infarction.  . TRANSTHORACIC ECHOCARDIOGRAM  08/18/2016   ** in setting of sepsis/cholecystitis: EF 30-35% with severe hypokinesis of the distal septal, distal inferior, mid/distal anterior, mid/distal anterolateral walls and Akinesis of the apex. The cavity size was normal. Wall thickness was normal. Trivial Aortic regurgitation. Mitral Mild MR  . TUBAL LIGATION       OB History   No obstetric history on file.      Home Medications    Prior to Admission medications   Medication Sig Start Date End Date Taking? Authorizing Provider  amLODipine (NORVASC) 2.5 MG tablet TAKE 1 TABLET BY MOUTH EVERY DAY 04/25/18   [place], Betty G, MD  aspirin EC 81 MG tablet Take 1 tablet (81 mg total) by mouth daily. 08/25/16   Rolly SalterPatel, Pranav M, MD  atorvastatin (LIPITOR) 20 MG tablet TAKE 1 TABLET (20 MG TOTAL) BY MOUTH DAILY AT 6 PM. 03/19/18   [place], Betty G, MD  benzonatate (TESSALON) 100 MG capsule Take 2 capsules (200 mg total) by mouth 2 (two) times daily as needed for up to 10 days. 05/16/18 05/26/18  [place], Betty G, MD  clotrimazole-betamethasone (LOTRISONE) cream Apply 1 application topically 2 (two) times daily. 08/29/17   [place], Betty G, MD  folic acid (FOLVITE) 1 MG tablet Take 1 tablet (1 mg  total) by mouth daily. 11/30/16   [place], Betty G, MD  hydrochlorothiazide (HYDRODIURIL) 25 MG tablet TAKE 1 TABLET BY MOUTH EVERY DAY 04/25/18   [place], Betty G, MD  nystatin (NYSTATIN) powder Apply topically 4 (four) times daily. 08/29/17   [place], Betty G, MD  polyethylene glycol [facility] / Ethelene Hal) packet Take 17 g by mouth daily. 08/25/16   Rolly Salter, MD  potassium chloride SA (K-DUR,KLOR-CON) 20 MEQ tablet 1 tablet twice daily for 7 days then continue 1 tablet daily. 11/29/17   [place], Betty G, MD    thiamine (VITAMIN B-1) 100 MG tablet Take 1 tablet (100 mg total) by mouth daily. 11/30/16   [place], Betty G, MD    Family History Family History  Problem Relation Age of Onset  . Mental illness Mother        "Dementia"  . Hyperlipidemia Mother   . Lung cancer Father   . Hypertension Father   . Hypertension Sister   . Other Sister        non-alcohol fatty disease  . Colon cancer Sister     Social History Social History   Tobacco Use  . Smoking status: Never Smoker  . Smokeless tobacco: Never Used  Substance Use Topics  . Alcohol use: No  . Drug use: No     Allergies   No known allergies   Review of Systems Review of Systems  Constitutional: Negative for chills.  HENT: Positive for rhinorrhea. Negative for sore throat.   Eyes: Negative for redness.  Respiratory: Positive for cough. Negative for shortness of breath.   Cardiovascular: Negative for chest pain.  Gastrointestinal: Positive for nausea. Negative for abdominal pain, diarrhea and vomiting.  Genitourinary: Negative for dysuria and flank pain.  Musculoskeletal: Negative for neck pain and neck stiffness.  Skin: Negative for rash.  Neurological: Negative for headaches.  Hematological: Does not bruise/bleed easily.  Psychiatric/Behavioral: Negative for confusion.     Physical Exam Updated Vital Signs BP 128/87 (BP Location: Right Arm)   Pulse 73   Temp 98.1 F (36.7 C)   Resp 18   SpO2 99%   Physical Exam Vitals signs and nursing note reviewed.  Constitutional:      Appearance: Normal appearance. She is well-developed.  HENT:     Head: Atraumatic.     Right Ear: Tympanic membrane normal.     Left Ear: Tympanic membrane normal.     Nose: Congestion present.     Comments: No sinus tenderness.     Mouth/Throat:     Mouth: Mucous membranes are moist.  Eyes:     General: No scleral icterus.    Conjunctiva/sclera: Conjunctivae normal.     Pupils: Pupils are equal, round, and reactive to light.   Neck:     Musculoskeletal: Normal range of motion and neck supple. No neck rigidity or muscular tenderness.     Trachea: No tracheal deviation.  Cardiovascular:     Rate and Rhythm: Normal rate and regular rhythm.     Pulses: Normal pulses.     Heart sounds: Normal heart sounds. No murmur. No friction rub. No gallop.   Pulmonary:     Effort: Pulmonary effort is normal. No respiratory distress.     Breath sounds: Normal breath sounds.  Abdominal:     General: Bowel sounds are normal. There is no distension.     Palpations: Abdomen is soft.     Tenderness: There is no abdominal tenderness. There is no guarding.  Genitourinary:  Comments: No cva tenderness.  Musculoskeletal:        General: No swelling.     Right lower leg: No edema.     Left lower leg: No edema.  Skin:    General: Skin is warm and dry.     Findings: No rash.  Neurological:     Mental Status: She is alert.     Comments: Alert, speech normal.   Psychiatric:        Mood and Affect: Mood normal.      ED Treatments / Results  Labs (all labs ordered are listed, but only abnormal results are displayed) Labs Reviewed - No data to display  EKG None  Radiology Dg Chest 2 View  Result Date: 05/26/2018 CLINICAL DATA:  Cough, fever. EXAM: CHEST - 2 VIEW COMPARISON:  Radiographs of May 16, 2018. FINDINGS: The heart size and mediastinal contours are within normal limits. Both lungs are clear. The visualized skeletal structures are unremarkable. IMPRESSION: No active cardiopulmonary disease. Electronically Signed   By: Lupita Raider, M.D.   On: 05/26/2018 19:09    Procedures Procedures (including critical care time)  Medications Ordered in ED Medications - No data to display   Initial Impression / Assessment and Plan / ED Course  I have reviewed the triage vital signs and the nursing notes.  Pertinent labs & imaging results that were available during my care of the patient were reviewed by me and  considered in my medical decision making (see chart for details).  CXR.  Reviewed nursing notes and prior charts for additional history.   Pt indicates the antibiotic she just finished made nauseated, requests med for same. zofran po. Po fluids.   cxr reviewed - no pna.   Vitals normal. Afebrile. No increased wob. No vomiting. Denies pain.  Suspect viral uri - as symptoms ongoing x 1 week, tamiflu not indicated.   rec rest, po fluids, otc meds for symptom relief, pcp f/u.  Return precautions provided.     Final Clinical Impressions(s) / ED Diagnoses   Final diagnoses:  None    ED Discharge Orders    None       Cathren Laine, MD 05/26/18 Barry Brunner

## 2018-05-26 NOTE — Discharge Instructions (Addendum)
It was our pleasure to provide your ER care today - we hope that you feel better.  Rest. Drink plenty of fluids. You may take zofran as need for nausea.  You may try Mucinex as need for cough. You may try zyrtec-d or claritin-d as need for congestion.   Follow up with primary care doctor in the coming week if symptoms fail to improve/resolve.  Return to ER if worse, new symptoms, increased trouble breathing, chest pain, persistent vomiting, other concern.

## 2018-05-30 ENCOUNTER — Encounter: Payer: Self-pay | Admitting: Family Medicine

## 2018-05-30 ENCOUNTER — Ambulatory Visit (INDEPENDENT_AMBULATORY_CARE_PROVIDER_SITE_OTHER): Payer: Medicare Other | Admitting: Family Medicine

## 2018-05-30 VITALS — BP 124/78 | HR 102 | Temp 97.6°F | Resp 16 | Ht 61.0 in | Wt 105.4 lb

## 2018-05-30 DIAGNOSIS — R7989 Other specified abnormal findings of blood chemistry: Secondary | ICD-10-CM

## 2018-05-30 DIAGNOSIS — R634 Abnormal weight loss: Secondary | ICD-10-CM

## 2018-05-30 DIAGNOSIS — R110 Nausea: Secondary | ICD-10-CM | POA: Diagnosis not present

## 2018-05-30 DIAGNOSIS — R519 Headache, unspecified: Secondary | ICD-10-CM

## 2018-05-30 DIAGNOSIS — R1013 Epigastric pain: Secondary | ICD-10-CM

## 2018-05-30 DIAGNOSIS — R51 Headache: Secondary | ICD-10-CM

## 2018-05-30 LAB — TSH: TSH: 0.54 u[IU]/mL (ref 0.35–4.50)

## 2018-05-30 LAB — BASIC METABOLIC PANEL
BUN: 29 mg/dL — AB (ref 6–23)
CO2: 31 mEq/L (ref 19–32)
CREATININE: 1.41 mg/dL — AB (ref 0.40–1.20)
Calcium: 10 mg/dL (ref 8.4–10.5)
Chloride: 96 mEq/L (ref 96–112)
GFR: 36.7 mL/min — ABNORMAL LOW (ref >60.00)
GLUCOSE: 121 mg/dL — AB (ref 70–99)
Potassium: 3 mEq/L — ABNORMAL LOW (ref 3.5–5.1)
Sodium: 139 mEq/L (ref 135–145)

## 2018-05-30 LAB — CBC
HEMATOCRIT: 39.5 % (ref 36.0–46.0)
HEMOGLOBIN: 13.3 g/dL (ref 12.0–15.0)
MCHC: 33.7 g/dL (ref 30.0–36.0)
MCV: 90.3 fl (ref 78.0–100.0)
Platelets: 450 10*3/uL — ABNORMAL HIGH (ref 150.0–400.0)
RBC: 4.37 Mil/uL (ref 3.87–5.11)
RDW: 13.2 % (ref 11.5–15.5)
WBC: 10.1 10*3/uL (ref 4.0–10.5)

## 2018-05-30 LAB — T3, FREE: T3, Free: 3.3 pg/mL (ref 2.3–4.2)

## 2018-05-30 LAB — T4, FREE: Free T4: 1.16 ng/dL (ref 0.60–1.60)

## 2018-05-30 MED ORDER — ONDANSETRON 8 MG PO TBDP: 8.0000 mg | ORAL_TABLET | Freq: Three times a day (TID) | ORAL | 0 refills | Status: DC | PRN

## 2018-05-30 MED ORDER — OMEPRAZOLE 20 MG PO CPDR: 20.0000 mg | DELAYED_RELEASE_CAPSULE | Freq: Every day | ORAL | 1 refills | Status: DC

## 2018-05-30 NOTE — Patient Instructions (Addendum)
A few things to remember from today's visit:   Nausea without vomiting - Plan: CBC, Basic metabolic panel, ondansetron (ZOFRAN ODT) 8 MG disintegrating tablet  Weight loss, abnormal - Plan: CBC, TSH  Abnormal TSH - Plan: TSH, T4, free, T3, free  Dyspepsia - Plan: omeprazole (PRILOSEC) 20 MG capsule  Soft and bland diet, avoid food that could irritate your stomach. If you have more episodes of diarrhea you need to start drinking clear fluids, you can mix Gatorade with water and 2 small sips frequently during the day.  Omeprazole 30 minutes before breakfast. Monitor temperature.  Please be sure medication list is accurate. If a new problem present, please set up appointment sooner than planned today.

## 2018-05-30 NOTE — Progress Notes (Signed)
ACUTE VISIT   HPI:  Chief Complaint  Patient presents with  . Nausea  . Poor appetite  . Weight Loss    [name] is a 73 y.o. female, who is here today with her sister with above complaints.  She was last seen on 05/16/18,when I recommended abx treatment because fever,cough,and abnormal auscultation. Last day of abx (05/24/18) she started with nausea and decreased appetite,which she attributed to abx,Doxycycline.  Symptoms getting worse,so she was in the ER on 05/26/18,Dx with viral URI, she was discharged on Zofran. Zofran has helped with nausea. Negative for vomiting.  Last night and this morning "bad" abdominal cramps,she is not having pain now. alleviated by defecation This morning she had an episode of loose stool,no blood or mucus.  Symptoms exacerbated by food intake. Today she tried to eat a piece of pizza,aggravated nausea.  She is not drinking much fluids. Mildly decreased urine output, denies dysuria, frequency, or gross hematuria.  She has not noted fever,chills, or body aches.  Her son was recently sick with gastrointestinal viral illness..  3 days ago she started with frontal headache and teeth aching,intermittent. Pain is alleviated by tylenol. No visual changes,facial edema,or focal deficit.  She has had abnormal TSH.  Lab Results  Component Value Date   TSH 0.336 (L) 08/17/2016   T4 normalized 1.12 and T3 low at 80.  These labs were done during hospitalization.  She has not noted tremor,palpitations,cold/heat intolerance.   Review of Systems  Constitutional: Positive for activity change, appetite change and fatigue. Negative for fever.  HENT: Positive for ear pain. Negative for congestion, facial swelling, mouth sores, rhinorrhea, sore throat and trouble swallowing.   Eyes: Negative for redness and visual disturbance.  Respiratory: Negative for cough, shortness of breath and wheezing.   Cardiovascular: Negative for chest pain,  palpitations and leg swelling.  Gastrointestinal: Positive for abdominal pain, diarrhea and nausea. Negative for vomiting.  Endocrine: Negative for cold intolerance and heat intolerance.  Genitourinary: Positive for decreased urine volume. Negative for dysuria and hematuria.  Musculoskeletal: Positive for gait problem. Negative for myalgias and neck pain.  Skin: Negative for rash.  Neurological: Positive for headaches. Negative for tremors and syncope.  Hematological: Negative for adenopathy.    Current Outpatient Medications on File Prior to Visit  Medication Sig Dispense Refill  . amLODipine (NORVASC) 2.5 MG tablet TAKE 1 TABLET BY MOUTH EVERY DAY 90 tablet 1  . aspirin EC 81 MG tablet Take 1 tablet (81 mg total) by mouth daily. 30 tablet 0  . atorvastatin (LIPITOR) 20 MG tablet TAKE 1 TABLET (20 MG TOTAL) BY MOUTH DAILY AT 6 PM. 90 tablet 0  . clotrimazole-betamethasone (LOTRISONE) cream Apply 1 application topically 2 (two) times daily. 30 g 1  . folic acid (FOLVITE) 1 MG tablet Take 1 tablet (1 mg total) by mouth daily. 90 tablet 0  . hydrochlorothiazide (HYDRODIURIL) 25 MG tablet TAKE 1 TABLET BY MOUTH EVERY DAY 90 tablet 1  . nystatin (NYSTATIN) powder Apply topically 4 (four) times daily. 15 g 0  . polyethylene glycol (MIRALAX / GLYCOLAX) packet Take 17 g by mouth daily. 14 each 0  . potassium chloride SA (KLOR-CON M20) 20 MEQ tablet TAKE 1 TABLET TWICE DAILY FOR 7 DAYS THEN CONTINUE 1 TABLET DAILY. 90 tablet 1  . thiamine (VITAMIN B-1) 100 MG tablet Take 1 tablet (100 mg total) by mouth daily. 90 tablet 0   No current facility-administered medications on file prior to  visit.      Past Medical History:  Diagnosis Date  . Anxiety   . Cardiomyopathy (HCC) 08/2016   EF 35% with suggestion of anterior wall motion abnormality. Cannot exclude ischemic versus Takotsubo  . Chicken pox   . Chronic kidney disease    polycystic  . Dehydration    recently hosp  . Depression   .  History of kidney stones    cyst on kidney  . Hypertension   . Hypokalemia    Allergies  Allergen Reactions  . No Known Allergies     Social History   Socioeconomic History  . Marital status: Single    Spouse name: Not on file  . Number of children: Not on file  . Years of education: Not on file  . Highest education level: Not on file  Occupational History  . Not on file  Social Needs  . Financial resource strain: Not on file  . Food insecurity:    Worry: Not on file    Inability: Not on file  . Transportation needs:    Medical: Not on file    Non-medical: Not on file  Tobacco Use  . Smoking status: Never Smoker  . Smokeless tobacco: Never Used  Substance and Sexual Activity  . Alcohol use: No  . Drug use: No  . Sexual activity: Not Currently  Lifestyle  . Physical activity:    Days per week: Not on file    Minutes per session: Not on file  . Stress: Not on file  Relationships  . Social connections:    Talks on phone: Not on file    Gets together: Not on file    Attends religious service: Not on file    Active member of club or organization: Not on file    Attends meetings of clubs or organizations: Not on file    Relationship status: Not on file  Other Topics Concern  . Not on file  Social History Narrative  . Not on file    Vitals:   05/30/18 1442  BP: 124/78  Pulse: (!) 102  Resp: 16  Temp: 97.6 F (36.4 C)  SpO2: 97%   Body mass index is 19.91 kg/m.  Wt Readings from Last 3 Encounters:  05/30/18 105 lb 6 oz (47.8 kg)  05/16/18 112 lb 2 oz (50.9 kg)  11/29/17 121 lb 8 oz (55.1 kg)     Physical Exam  Nursing note and vitals reviewed. Constitutional: She is oriented to person, place, and time. She appears well-developed. No distress.  HENT:  Head: Normocephalic and atraumatic.  Right Ear: Tympanic membrane, external ear and ear canal normal.  Left Ear: Tympanic membrane, external ear and ear canal normal.  Nose: Right sinus exhibits no  frontal sinus tenderness. Left sinus exhibits no frontal sinus tenderness.  Mouth/Throat: Oropharynx is clear and moist. Mucous membranes are dry (Mild).  "Little pressure" upon palpation of maxillary sinuses.   Eyes: Pupils are equal, round, and reactive to light. Conjunctivae are normal.  Cardiovascular: Regular rhythm. Tachycardia present.  No murmur heard. Pulses:      Dorsalis pedis pulses are 2+ on the right side and 2+ on the left side.  Respiratory: Effort normal and breath sounds normal. No respiratory distress.  GI: Soft. She exhibits no mass. There is no abdominal tenderness.  Musculoskeletal:        General: No edema.  Lymphadenopathy:    She has no cervical adenopathy.  Neurological: She is alert and oriented  to person, place, and time. She has normal strength. No cranial nerve deficit.  Unstable gait,not assisted.  Skin: Skin is warm. No rash noted. No erythema.  Psychiatric: She has a normal mood and affect.  Fairly groomed, good eye contact.    ASSESSMENT AND PLAN:  Colleen Davis was seen today for nausea, poor appetite and weight loss.  Diagnoses and all orders for this visit:  Nausea without vomiting Possible causes discussed, ? abx side effects,dyspepsia,viral. Continue Zofran 4 mg tid prn. Monitor for new symptoms or worsening diarrhea. Avoid foods that could causue GI irritation like pizza. Instructed about warning signs. F/U in 10 days.  -     CBC -     Basic metabolic panel -     ondansetron (ZOFRAN ODT) 8 MG disintegrating tablet; Take 1 tablet (8 mg total) by mouth every 8 (eight) hours as needed for up to 5 days for nausea or vomiting.  Weight loss, abnormal Has lost about 7 Lb since 05/16/18. Possible causes dicussed but most likely related to decreased oral intake. Recommend trying to eat small portions of soups,chicken broth.  We will continue following.  -     CBC -     TSH  Abnormal TSH Abnormal TSH could have been related to acute illness  at the time lab was done. Will re-check and give recommendations accordingly.  -     TSH -     T4, free -     T3, free  Dyspepsia Could be related to abx. GERD precautions discussed. Bland ,solt diet as tolerated. Trial of Omeprazole recommended. F/U in 10 days.  -     omeprazole (PRILOSEC) 20 MG capsule; Take 1 capsule (20 mg total) by mouth daily.  Headache, unspecified headache type Neurologic examination today normal. I do not think head imaging is needed today. It could be related to sinus congestion,dehydration, or some of above problems. Increase fluid intake. Continue Tylenol 500 mg tid prn. Instructed about warning signs. F/U in 10 days.     Return in about 10 days (around 06/09/2018) for Nausea,wt loss.       Brilyn Tuller G. [place], MD  [facility]. [place] office.

## 2018-05-31 ENCOUNTER — Encounter: Payer: Self-pay | Admitting: Family Medicine

## 2018-06-11 ENCOUNTER — Ambulatory Visit (INDEPENDENT_AMBULATORY_CARE_PROVIDER_SITE_OTHER): Payer: Medicare Other | Admitting: Family Medicine

## 2018-06-11 ENCOUNTER — Encounter: Payer: Self-pay | Admitting: Family Medicine

## 2018-06-11 VITALS — BP 120/76 | HR 90 | Temp 97.8°F | Resp 12 | Ht 61.0 in | Wt 107.5 lb

## 2018-06-11 DIAGNOSIS — I10 Essential (primary) hypertension: Secondary | ICD-10-CM

## 2018-06-11 DIAGNOSIS — R64 Cachexia: Secondary | ICD-10-CM

## 2018-06-11 DIAGNOSIS — N183 Chronic kidney disease, stage 3 unspecified: Secondary | ICD-10-CM

## 2018-06-11 DIAGNOSIS — R1013 Epigastric pain: Secondary | ICD-10-CM | POA: Diagnosis not present

## 2018-06-11 DIAGNOSIS — E876 Hypokalemia: Secondary | ICD-10-CM

## 2018-06-11 LAB — MICROALBUMIN / CREATININE URINE RATIO
CREATININE, U: 96.7 mg/dL
MICROALB UR: 17 mg/dL — AB (ref 0.0–1.9)
Microalb Creat Ratio: 17.6 mg/g (ref 0.0–30.0)

## 2018-06-11 LAB — BASIC METABOLIC PANEL
BUN: 16 mg/dL (ref 6–23)
CALCIUM: 9.6 mg/dL (ref 8.4–10.5)
CO2: 30 mEq/L (ref 19–32)
Chloride: 101 mEq/L (ref 96–112)
Creatinine, Ser: 1.02 mg/dL (ref 0.40–1.20)
GFR: 53.32 mL/min — AB (ref >60.00)
Glucose, Bld: 96 mg/dL (ref 70–99)
POTASSIUM: 3.6 meq/L (ref 3.5–5.1)
SODIUM: 142 meq/L (ref 135–145)

## 2018-06-11 LAB — VITAMIN D 25 HYDROXY (VIT D DEFICIENCY, FRACTURES): VITD: 78.02 ng/mL (ref 30.00–100.00)

## 2018-06-11 MED ORDER — OMEPRAZOLE 20 MG PO CPDR: 20.0000 mg | DELAYED_RELEASE_CAPSULE | Freq: Every day | ORAL | 1 refills | Status: DC

## 2018-06-11 MED ORDER — HYDROCHLOROTHIAZIDE 25 MG PO TABS: 25.0000 mg | ORAL_TABLET | Freq: Every day | ORAL | 2 refills | Status: DC

## 2018-06-11 MED ORDER — ATORVASTATIN CALCIUM 20 MG PO TABS: 20.0000 mg | ORAL_TABLET | Freq: Every day | ORAL | 1 refills | Status: DC

## 2018-06-11 MED ORDER — AMLODIPINE BESYLATE 2.5 MG PO TABS: 2.5000 mg | ORAL_TABLET | Freq: Every day | ORAL | 1 refills | Status: DC

## 2018-06-11 MED ORDER — POTASSIUM CHLORIDE CRYS ER 20 MEQ PO TBCR: EXTENDED_RELEASE_TABLET | ORAL | 1 refills | Status: DC

## 2018-06-11 NOTE — Assessment & Plan Note (Signed)
Since her last visit on 05/30/2018 she has gained about 2-3 pounds. Continue a healthful diet, avoid skipping meals, and Ensure or boost x 1 daily. We will continue following. Follow-up in 4 months.

## 2018-06-11 NOTE — Progress Notes (Signed)
HPI:   [name] is a 73 y.o. female, who is here today to follow on recent OV. She was seen on 05/30/2018, when she was complaining about nausea, decreased appetite, and concern about weight loss. Her appetite has improved. She has gained some weight.  Nausea improved after starting Omeprazole 20 mg. No abdominal pain,vomiting,or changes in bowel habits.  She is sad today,crying sometimes during OV. She is living with her son and his girlfriend, today she was asked to leave their house.  She is planning on living with her sister. She has all her medications in her son's house, she is not allowed to go and pick them up.  HTN: Currently on amlodipine 2.5 mg daily and HCTZ 25 mg daily.  She has not noted foam in urine,gross hematuria,or decreased urine output. Her sister does not think she is drinking enough fluids. She states that her son and girlfriend do not take good care of her. She denies physical abuse.  Lab Results  Component Value Date   CREATININE 1.41 (H) 05/30/2018   BUN 29 (H) 05/30/2018   NA 139 05/30/2018   K 3.0 (L) 05/30/2018   CL 96 05/30/2018   CO2 31 05/30/2018   Denies severe/frequent headache, visual changes, chest pain, dyspnea, palpitation, claudication, focal weakness, or edema.  Hypokalemia: Currently she is on K-Lor 20 mEq daily.  Her sister is concerned about risk for falls. She think gait instability is gradually getting worse.  Review of Systems  Constitutional: Positive for appetite change and fatigue.  Respiratory: Negative for cough, shortness of breath and wheezing.   Cardiovascular: Negative for chest pain and palpitations.  Gastrointestinal: Positive for nausea. Negative for abdominal distention, abdominal pain and vomiting.  Musculoskeletal: Positive for gait problem. Negative for myalgias.  Neurological: Negative for syncope, weakness and headaches.  Psychiatric/Behavioral: Negative for confusion and suicidal ideas.  The patient is nervous/anxious.     Current Outpatient Medications on File Prior to Visit  Medication Sig Dispense Refill  . aspirin EC 81 MG tablet Take 1 tablet (81 mg total) by mouth daily. 30 tablet 0  . clotrimazole-betamethasone (LOTRISONE) cream Apply 1 application topically 2 (two) times daily. 30 g 1  . folic acid (FOLVITE) 1 MG tablet Take 1 tablet (1 mg total) by mouth daily. 90 tablet 0  . nystatin (NYSTATIN) powder Apply topically 4 (four) times daily. 15 g 0  . polyethylene glycol (MIRALAX / GLYCOLAX) packet Take 17 g by mouth daily. 14 each 0  . thiamine (VITAMIN B-1) 100 MG tablet Take 1 tablet (100 mg total) by mouth daily. 90 tablet 0   No current facility-administered medications on file prior to visit.      Past Medical History:  Diagnosis Date  . Anxiety   . Cardiomyopathy (HCC) 08/2016   EF 35% with suggestion of anterior wall motion abnormality. Cannot exclude ischemic versus Takotsubo  . Chicken pox   . Chronic kidney disease    polycystic  . Dehydration    recently hosp  . Depression   . History of kidney stones    cyst on kidney  . Hypertension   . Hypokalemia    Allergies  Allergen Reactions  . No Known Allergies     Social History   Socioeconomic History  . Marital status: Single    Spouse name: Not on file  . Number of children: Not on file  . Years of education: Not on file  . Highest education level:  Not on file  Occupational History  . Not on file  Social Needs  . Financial resource strain: Not on file  . Food insecurity:    Worry: Not on file    Inability: Not on file  . Transportation needs:    Medical: Not on file    Non-medical: Not on file  Tobacco Use  . Smoking status: Never Smoker  . Smokeless tobacco: Never Used  Substance and Sexual Activity  . Alcohol use: No  . Drug use: No  . Sexual activity: Not Currently  Lifestyle  . Physical activity:    Days per week: Not on file    Minutes per session: Not on file  .  Stress: Not on file  Relationships  . Social connections:    Talks on phone: Not on file    Gets together: Not on file    Attends religious service: Not on file    Active member of club or organization: Not on file    Attends meetings of clubs or organizations: Not on file    Relationship status: Not on file  Other Topics Concern  . Not on file  Social History Narrative  . Not on file    Vitals:   06/11/18 1359  BP: 120/76  Pulse: 90  Resp: 12  Temp: 97.8 F (36.6 C)  SpO2: 97%   Body mass index is 20.31 kg/m.  Wt Readings from Last 3 Encounters:  06/11/18 107 lb 8 oz (48.8 kg)  05/30/18 105 lb 6 oz (47.8 kg)  05/16/18 112 lb 2 oz (50.9 kg)    Physical Exam  Nursing note and vitals reviewed. Constitutional: She is oriented to person, place, and time. She appears well-developed. No distress.  HENT:  Head: Normocephalic and atraumatic.  Mouth/Throat: Oropharynx is clear and moist and mucous membranes are normal.  Eyes: Pupils are equal, round, and reactive to light. Conjunctivae are normal.  Cardiovascular: Normal rate and regular rhythm.  No murmur heard. Respiratory: Effort normal and breath sounds normal. No respiratory distress.  GI: Soft. She exhibits no mass. There is no abdominal tenderness.  Musculoskeletal:        General: No edema.  Lymphadenopathy:    She has no cervical adenopathy.  Neurological: She is alert and oriented to person, place, and time. She has normal strength. No cranial nerve deficit. Gait abnormal.  Unstable gait,not assisted.  Skin: Skin is warm. No rash noted. No erythema.  Psychiatric: Her affect is labile.  Poor groomed, good eye contact.    ASSESSMENT AND PLAN:  [name] was seen today for follow-up.  Orders Placed This Encounter  Procedures  . Basic metabolic panel  . Microalbumin / creatinine urine ratio  . VITAMIN D 25 Hydroxy (Vit-D Deficiency, Fractures)    Lab Results  Component Value Date   CREATININE 1.02  06/11/2018   BUN 16 06/11/2018   NA 142 06/11/2018   K 3.6 06/11/2018   CL 101 06/11/2018   CO2 30 06/11/2018   Lab Results  Component Value Date   MICROALBUR 17.0 (H) 06/11/2018   Dyspepsia Symptoms improved. No changes in omeprazole 20 mg daily. GERD precautions also recommended. Follow-up in 4 months.  -     omeprazole (PRILOSEC) 20 MG capsule; Take 1 capsule (20 mg total) by mouth daily.  HTN (hypertension) Adequately controlled. No changes in current management. Continue low-salt diet. Eye exam recommended annually. F/U in 4 months, before if needed.   Hypokalemia No changes in current management,  will follow labs done today and will give further recommendations accordingly.   Cachexia (HCC) Since her last visit on 05/30/2018 she has gained about 2-3 pounds. Continue a healthful diet, avoid skipping meals, and Ensure or boost x 1 daily. We will continue following. Follow-up in 4 months.   CKD (chronic kidney disease), stage III (HCC) Adequate hydration and BP control. Low-salt diet. Avoid NSAIDs. Further recommendation will be given according to lab results.   Return in about 4 months (around 10/10/2018) for f/u.    Betty G. [place], MD  [facility]. [place] office.

## 2018-06-11 NOTE — Assessment & Plan Note (Signed)
Adequately controlled. No changes in current management. Continue low-salt diet. Eye exam recommended annually. F/U in 4 months, before if needed.

## 2018-06-11 NOTE — Patient Instructions (Signed)
A few things to remember from today's visit:   Essential hypertension - Plan: Basic metabolic panel  Cachexia (HCC)  Continue low-salt diet. Adequate hydration. No changes in medications today.  Please be sure medication list is accurate. If a new problem present, please set up appointment sooner than planned today.

## 2018-06-11 NOTE — Assessment & Plan Note (Signed)
No changes in current management, will follow labs done today and will give further recommendations accordingly.  

## 2018-06-11 NOTE — Assessment & Plan Note (Signed)
Adequate hydration and BP control. Low-salt diet. Avoid NSAIDs. Further recommendation will be given according to lab results.

## 2018-06-12 ENCOUNTER — Encounter: Payer: Self-pay | Admitting: Family Medicine

## 2018-06-15 ENCOUNTER — Telehealth: Payer: Self-pay | Admitting: Family Medicine

## 2018-06-15 ENCOUNTER — Encounter: Payer: Self-pay | Admitting: Family Medicine

## 2018-06-15 NOTE — Telephone Encounter (Signed)
Form completed. Left voicemail informing that form is ready for pick-up at the pharmacy.

## 2018-06-15 NOTE — Telephone Encounter (Signed)
Form placed at front desk for pick up.

## 2018-06-15 NOTE — Telephone Encounter (Signed)
Handicap Placard form to be filled out; placed in dr's folder.  Call [phone] upon completion.

## 2018-06-22 ENCOUNTER — Telehealth: Payer: Self-pay

## 2018-06-22 NOTE — Telephone Encounter (Signed)
Author phoned pt. to reschedule awv for after maternity leave. No answer, unable to leave VM. Chartered loss adjuster sent [employer].

## 2018-08-06 ENCOUNTER — Other Ambulatory Visit: Payer: Self-pay

## 2018-08-06 ENCOUNTER — Encounter (HOSPITAL_BASED_OUTPATIENT_CLINIC_OR_DEPARTMENT_OTHER): Payer: Self-pay

## 2018-08-06 ENCOUNTER — Emergency Department (HOSPITAL_BASED_OUTPATIENT_CLINIC_OR_DEPARTMENT_OTHER): Payer: Medicare Other

## 2018-08-06 ENCOUNTER — Emergency Department (HOSPITAL_BASED_OUTPATIENT_CLINIC_OR_DEPARTMENT_OTHER)
Admission: EM | Admit: 2018-08-06 | Discharge: 2018-08-07 | Disposition: A | Discharge status: Home / Self Care | Payer: Medicare Other | Attending: Emergency Medicine | Admitting: Emergency Medicine

## 2018-08-06 DIAGNOSIS — F419 Anxiety disorder, unspecified: Secondary | ICD-10-CM | POA: Insufficient documentation

## 2018-08-06 DIAGNOSIS — I129 Hypertensive chronic kidney disease with stage 1 through stage 4 chronic kidney disease, or unspecified chronic kidney disease: Secondary | ICD-10-CM | POA: Insufficient documentation

## 2018-08-06 DIAGNOSIS — K8020 Calculus of gallbladder without cholecystitis without obstruction: Secondary | ICD-10-CM | POA: Diagnosis not present

## 2018-08-06 DIAGNOSIS — N289 Disorder of kidney and ureter, unspecified: Secondary | ICD-10-CM | POA: Insufficient documentation

## 2018-08-06 DIAGNOSIS — E876 Hypokalemia: Secondary | ICD-10-CM | POA: Insufficient documentation

## 2018-08-06 DIAGNOSIS — Z79899 Other long term (current) drug therapy: Secondary | ICD-10-CM | POA: Diagnosis not present

## 2018-08-06 DIAGNOSIS — R945 Abnormal results of liver function studies: Secondary | ICD-10-CM | POA: Insufficient documentation

## 2018-08-06 DIAGNOSIS — Z7982 Long term (current) use of aspirin: Secondary | ICD-10-CM | POA: Insufficient documentation

## 2018-08-06 DIAGNOSIS — N390 Urinary tract infection, site not specified: Secondary | ICD-10-CM | POA: Insufficient documentation

## 2018-08-06 DIAGNOSIS — F329 Major depressive disorder, single episode, unspecified: Secondary | ICD-10-CM | POA: Diagnosis not present

## 2018-08-06 DIAGNOSIS — N183 Chronic kidney disease, stage 3 (moderate): Secondary | ICD-10-CM | POA: Diagnosis not present

## 2018-08-06 DIAGNOSIS — N3000 Acute cystitis without hematuria: Secondary | ICD-10-CM | POA: Diagnosis not present

## 2018-08-06 DIAGNOSIS — R7989 Other specified abnormal findings of blood chemistry: Secondary | ICD-10-CM

## 2018-08-06 DIAGNOSIS — R1030 Lower abdominal pain, unspecified: Secondary | ICD-10-CM | POA: Diagnosis present

## 2018-08-06 DIAGNOSIS — R000 Tachycardia, unspecified: Secondary | ICD-10-CM | POA: Diagnosis not present

## 2018-08-06 LAB — CBC WITH DIFFERENTIAL/PLATELET
Abs Immature Granulocytes: 0.07 10*3/uL (ref 0.00–0.07)
Basophils Absolute: 0.1 10*3/uL (ref 0.0–0.1)
Basophils Relative: 1 %
Eosinophils Absolute: 0 10*3/uL (ref 0.0–0.5)
Eosinophils Relative: 0 %
HCT: 37.5 % (ref 36.0–46.0)
Hemoglobin: 12.1 g/dL (ref 12.0–15.0)
Immature Granulocytes: 1 %
Lymphocytes Relative: 4 %
Lymphs Abs: 0.4 10*3/uL — ABNORMAL LOW (ref 0.7–4.0)
MCH: 30.7 pg (ref 26.0–34.0)
MCHC: 32.3 g/dL (ref 30.0–36.0)
MCV: 95.2 fL (ref 80.0–100.0)
Monocytes Absolute: 0.6 10*3/uL (ref 0.1–1.0)
Monocytes Relative: 6 %
Neutro Abs: 9.1 10*3/uL — ABNORMAL HIGH (ref 1.7–7.7)
Neutrophils Relative %: 88 %
Platelets: 255 10*3/uL (ref 150–400)
RBC: 3.94 MIL/uL (ref 3.87–5.11)
RDW: 13.3 % (ref 11.5–15.5)
WBC: 10.2 10*3/uL (ref 4.0–10.5)
nRBC: 0 % (ref 0.0–0.2)

## 2018-08-06 MED ORDER — ONDANSETRON HCL 4 MG/2ML IJ SOLN
4.0000 mg | Freq: Once | INTRAMUSCULAR | Status: AC
  Administered 2018-08-06: 4 mg via INTRAVENOUS
  Filled 2018-08-06: qty 2

## 2018-08-06 MED ORDER — SODIUM CHLORIDE 0.9 % IV BOLUS
1000.0000 mL | Freq: Once | INTRAVENOUS | Status: AC
  Administered 2018-08-06: 1000 mL via INTRAVENOUS

## 2018-08-06 NOTE — ED Provider Notes (Signed)
[facility] EMERGENCY DEPARTMENT Provider Note   CSN: [id number] Arrival date & time: 08/06/18  2253    History   Chief Complaint No chief complaint on file.   HPI Colleen Davis is a 73 y.o. female.   The history is provided by the patient.  She has history of hypertension, hyperlipidemia, chronic kidney disease and comes in with approximately 5-day history of suprapubic pain.  Pain is constant and is slowly getting worse.  She rates pain at 8/10.  She states that she had been constipated but did have a bowel movement yesterday and pain improved slightly following that.  There has been nausea but no vomiting.  Pain does not radiate.  She has taken acetaminophen without relief.  She has had nausea but no vomiting.  She denies fever, chills, sweats.  She denies any recent travel and denies exposure to anyone with COVID-19.  Past Medical History:  Diagnosis Date  . Anxiety   . Cardiomyopathy (HCC) 08/2016   EF 35% with suggestion of anterior wall motion abnormality. Cannot exclude ischemic versus Takotsubo  . Chicken pox   . Chronic kidney disease    polycystic  . Dehydration    recently hosp  . Depression   . History of kidney stones    cyst on kidney  . Hypertension   . Hypokalemia     Patient Active Problem List   Diagnosis Date Noted  . CKD (chronic kidney disease), stage III (HCC) 06/11/2018  . Bilateral lower extremity edema 10/30/2017  . Intertrigo 08/29/2017  . Frailty syndrome in geriatric patient 01/12/2017  . Cachexia (HCC) 11/02/2016  . Unstable gait 11/02/2016  . Preoperative cardiovascular examination 09/07/2016  . Takotsubo cardiomyopathy - resolved 09/07/2016  . Abnormal electrocardiogram (ECG) (EKG) 09/07/2016  . Hypotension due to drugs 09/07/2016  . Hypokalemia 08/17/2016  . Symptomatic cholelithiasis 08/17/2016  . Abnormal urinalysis 08/17/2016  . Mild concentric left ventricular hypertrophy (LVH) 08/17/2016  . Severe protein-calorie  malnutrition (HCC) 08/17/2016  . Visual hallucinations 08/17/2016  . Syncope and collapse 11/17/2011  . HTN (hypertension) 11/17/2011  . Tinnitus of right ear 11/17/2011  . Hyperlipidemia with target LDL less than 70 11/17/2011    Past Surgical History:  Procedure Laterality Date  . DILATION AND CURETTAGE OF UTERUS    . NM MYOVIEW LTD  09/14/2016   Normal EF 55-65%. Normal wall motion. No evidence of ischemia or infarction.  . TRANSTHORACIC ECHOCARDIOGRAM  08/18/2016   ** in setting of sepsis/cholecystitis: EF 30-35% with severe hypokinesis of the distal septal, distal inferior, mid/distal anterior, mid/distal anterolateral walls and Akinesis of the apex. The cavity size was normal. Wall thickness was normal. Trivial Aortic regurgitation. Mitral Mild MR  . TUBAL LIGATION       OB History   No obstetric history on file.      Home Medications    Prior to Admission medications   Medication Sig Start Date End Date Taking? Authorizing Provider  amLODipine (NORVASC) 2.5 MG tablet Take 1 tablet (2.5 mg total) by mouth daily. 06/11/18   [place], Betty G, MD  aspirin EC 81 MG tablet Take 1 tablet (81 mg total) by mouth daily. 08/25/16   Rolly Salter, MD  atorvastatin (LIPITOR) 20 MG tablet Take 1 tablet (20 mg total) by mouth daily at 6 PM. 06/11/18   [place], Betty G, MD  clotrimazole-betamethasone (LOTRISONE) cream Apply 1 application topically 2 (two) times daily. 08/29/17   [place], Betty G, MD  folic acid (FOLVITE) 1  MG tablet Take 1 tablet (1 mg total) by mouth daily. 11/30/16   [place], Betty G, MD  hydrochlorothiazide (HYDRODIURIL) 25 MG tablet Take 1 tablet (25 mg total) by mouth daily. 06/11/18   [place], Betty G, MD  nystatin (NYSTATIN) powder Apply topically 4 (four) times daily. 08/29/17   [place], Betty G, MD  omeprazole (PRILOSEC) 20 MG capsule Take 1 capsule (20 mg total) by mouth daily. 06/11/18   [place], Betty G, MD  polyethylene glycol [facility]) packet Take 17 g by mouth  daily. 08/25/16   Rolly Salter, MD  potassium chloride SA (KLOR-CON M20) 20 MEQ tablet TAKE 1 TABLET TWICE DAILY FOR 7 DAYS THEN CONTINUE 1 TABLET DAILY. 06/11/18   [place], Betty G, MD  thiamine (VITAMIN B-1) 100 MG tablet Take 1 tablet (100 mg total) by mouth daily. 11/30/16   [place], Betty G, MD    Family History Family History  Problem Relation Age of Onset  . Mental illness Mother        "Dementia"  . Hyperlipidemia Mother   . Lung cancer Father   . Hypertension Father   . Hypertension Sister   . Other Sister        non-alcohol fatty disease  . Colon cancer Sister     Social History Social History   Tobacco Use  . Smoking status: Never Smoker  . Smokeless tobacco: Never Used  Substance Use Topics  . Alcohol use: No  . Drug use: No     Allergies   No known allergies   Review of Systems Review of Systems  All other systems reviewed and are negative.    Physical Exam Updated Vital Signs BP (!) 148/67 (BP Location: Right Arm)   Pulse 60   Temp (!) 102.6 F (39.2 C) (Oral)   Resp 20   Ht 5\' 1"  (1.549 m)   Wt 49.9 kg   SpO2 96%   BMI 20.78 kg/m   Physical Exam Vitals signs and nursing note reviewed.    73 year old female, resting comfortably and in no acute distress. Vital signs are significant for elevated blood pressure. Oxygen saturation is 96%, which is normal. Head is normocephalic and atraumatic. PERRLA, EOMI. Oropharynx is clear. Neck is nontender and supple without adenopathy or JVD. Back is nontender and there is no CVA tenderness. Lungs are clear without rales, wheezes, or rhonchi. Chest is nontender. Heart has an irregular rhythm without murmur. Abdomen is soft, flat, nontender without masses or hepatosplenomegaly and peristalsis is hypoactive. Extremities have no cyanosis or edema, full range of motion is present. Skin is warm and dry without rash. Neurologic: Mental status is normal, cranial nerves are intact, there are no motor or  sensory deficits.  ED Treatments / Results  Labs (all labs ordered are listed, but only abnormal results are displayed) Labs Reviewed  CBC WITH DIFFERENTIAL/PLATELET - Abnormal; Notable for the following components:      Result Value   Neutro Abs 9.1 (*)    Lymphs Abs 0.4 (*)    All other components within normal limits  URINALYSIS, ROUTINE W REFLEX MICROSCOPIC - Abnormal; Notable for the following components:   APPearance CLOUDY (*)    pH 8.5 (*)    Hgb urine dipstick MODERATE (*)    Protein, ur 30 (*)    Nitrite POSITIVE (*)    Leukocytes,Ua SMALL (*)    All other components within normal limits  COMPREHENSIVE METABOLIC PANEL - Abnormal; Notable for the following components:  Sodium 134 (*)    Potassium 2.8 (*)    Chloride 96 (*)    Glucose, Bld 220 (*)    Creatinine, Ser 1.29 (*)    Albumin 3.3 (*)    AST 71 (*)    ALT 50 (*)    Alkaline Phosphatase 172 (*)    GFR calc non Af Amer 42 (*)    GFR calc Af Amer 48 (*)    All other components within normal limits  URINALYSIS, MICROSCOPIC (REFLEX) - Abnormal; Notable for the following components:   Bacteria, UA MANY (*)    All other components within normal limits  URINE CULTURE  LIPASE, BLOOD    EKG EKG Interpretation  Date/Time:  Monday August 06 2018 23:27:17 EDT Ventricular Rate:  118 PR Interval:    QRS Duration: 79 QT Interval:  241 QTC Calculation: 338 R Axis:   86 Text Interpretation:  Sinus tachycardia Multiple ventricular premature complexes Borderline right axis deviation Low voltage, extremity leads Borderline repolarization abnormality Baseline wander in lead(s) I II aVR aVL When compared with ECG of 09/06/2016, Premature ventricular complexes are now present T wave inversion in multiple leads  is no longer present Confirmed by Dione Booze ([id number]) on 08/06/2018 11:40:10 PM   Radiology Ct Abdomen Pelvis W Contrast  Result Date: 08/07/2018 CLINICAL DATA:  Right suprapubic pain EXAM: CT ABDOMEN AND PELVIS  WITH CONTRAST TECHNIQUE: Multidetector CT imaging of the abdomen and pelvis was performed using the standard protocol following bolus administration of intravenous contrast. CONTRAST:  75mL OMNIPAQUE IOHEXOL 300 MG/ML  SOLN COMPARISON:  07/15/2016 FINDINGS: Lower chest: No acute abnormality. Small hiatal hernia. The esophagus is fluid-filled. Hepatobiliary: Numerous small low-density lesions within the liver, likely small cysts, stable. Gallbladder is distended with 16 mm layering gallstone. This is stable since prior study. No biliary ductal dilatation. Pancreas: No focal abnormality or ductal dilatation. Spleen: No focal abnormality.  Normal size. Adrenals/Urinary Tract: Innumerable bilateral renal cysts, stable. No hydronephrosis. Adrenal glands and urinary bladder unremarkable. Stomach/Bowel: Moderate stool throughout the colon. Normal appendix. Stomach, large and small bowel grossly unremarkable. Vascular/Lymphatic: No evidence of aneurysm or adenopathy. Reproductive: Uterus and adnexa unremarkable.  No mass. Other: No free fluid or free air. Musculoskeletal: No acute bony abnormality. IMPRESSION: Cholelithiasis. Mild gallbladder distention. Findings are similar to prior study. Numerous hepatic and bilateral renal cysts, stable. Small hiatal hernia. Esophagus is fluid-filled, likely related to reflux or dysmotility. Moderate stool burden in the colon. No acute findings in the abdomen or pelvis. Electronically Signed   By: Charlett Nose M.D.   On: 08/07/2018 02:16    Procedures Procedures   Medications Ordered in ED Medications  potassium chloride 10 mEq in 100 mL IVPB (10 mEq Intravenous New Bag/Given 08/07/18 0217)  cefTRIAXone (ROCEPHIN) 1 g in sodium chloride 0.9 % 100 mL IVPB (has no administration in time range)  ondansetron (ZOFRAN) injection 4 mg (4 mg Intravenous Given 08/06/18 2346)  sodium chloride 0.9 % bolus 1,000 mL (1,000 mLs Intravenous New Bag/Given 08/06/18 2331)  potassium chloride SA  (K-DUR) CR tablet 40 mEq (40 mEq Oral Given 08/07/18 0043)  iohexol (OMNIPAQUE) 300 MG/ML solution 100 mL (80 mLs Intravenous Contrast Given 08/07/18 0154)     Initial Impression / Assessment and Plan / ED Course  I have reviewed the triage vital signs and the nursing notes.  Pertinent labs & imaging results that were available during my care of the patient were reviewed by me and considered in my medical decision making (  see chart for details).  Lower abdominal pain of uncertain cause.  Consider UTI, diverticulitis, ureterolithiasis.  Old records are reviewed, and she has no relevant past visits.  Will check screening labs and sent for CT of abdomen and pelvis.  ECG showed occasional PVCs, which accounts for the irregular rhythm noted on exam.  CT scan showed no acute process.  Urinalysis showed urinary tract infection with positive nitrite, many bacteria, and greater than 50 WBCs.  Labs were significant for mild renal insufficiency and moderate hypokalemia.  She was given IV fluids and IV as well as oral potassium.  She is given a dose of ceftriaxone and discharged with prescriptions for cephalexin and K. Dur.  Follow-up with PCP in 10 days to repeat urinalysis to make sure that infection has cleared.  Final Clinical Impressions(s) / ED Diagnoses   Final diagnoses:  Urinary tract infection without hematuria, site unspecified  Hypokalemia  Renal insufficiency  Elevated liver function tests    ED Discharge Orders         Ordered    cephALEXin (KEFLEX) 500 MG capsule  3 times daily     08/07/18 0248    potassium chloride SA (KLOR-CON M20) 20 MEQ tablet     08/07/18 0248           Dione Booze, MD 08/07/18 [phone]

## 2018-08-06 NOTE — ED Triage Notes (Signed)
Pt c/o abd pain and nausea x 4-5 days. Pt denies any other associated symptoms.

## 2018-08-07 ENCOUNTER — Encounter (HOSPITAL_BASED_OUTPATIENT_CLINIC_OR_DEPARTMENT_OTHER): Payer: Self-pay | Admitting: Radiology

## 2018-08-07 DIAGNOSIS — N390 Urinary tract infection, site not specified: Secondary | ICD-10-CM | POA: Diagnosis not present

## 2018-08-07 DIAGNOSIS — K8020 Calculus of gallbladder without cholecystitis without obstruction: Secondary | ICD-10-CM | POA: Diagnosis not present

## 2018-08-07 LAB — URINALYSIS, ROUTINE W REFLEX MICROSCOPIC
Bilirubin Urine: NEGATIVE
Glucose, UA: NEGATIVE mg/dL
Ketones, ur: NEGATIVE mg/dL
Nitrite: POSITIVE — AB
Protein, ur: 30 mg/dL — AB
Specific Gravity, Urine: 1.02 (ref 1.005–1.030)
pH: 8.5 — ABNORMAL HIGH (ref 5.0–8.0)

## 2018-08-07 LAB — COMPREHENSIVE METABOLIC PANEL
ALT: 50 U/L — ABNORMAL HIGH (ref 0–44)
AST: 71 U/L — ABNORMAL HIGH (ref 15–41)
Albumin: 3.3 g/dL — ABNORMAL LOW (ref 3.5–5.0)
Alkaline Phosphatase: 172 U/L — ABNORMAL HIGH (ref 38–126)
Anion gap: 11 (ref 5–15)
BUN: 21 mg/dL (ref 8–23)
CO2: 27 mmol/L (ref 22–32)
Calcium: 8.9 mg/dL (ref 8.9–10.3)
Chloride: 96 mmol/L — ABNORMAL LOW (ref 98–111)
Creatinine, Ser: 1.29 mg/dL — ABNORMAL HIGH (ref 0.44–1.00)
GFR calc Af Amer: 48 mL/min — ABNORMAL LOW (ref >60)
GFR calc non Af Amer: 42 mL/min — ABNORMAL LOW (ref >60)
Glucose, Bld: 220 mg/dL — ABNORMAL HIGH (ref 70–99)
Potassium: 2.8 mmol/L — ABNORMAL LOW (ref 3.5–5.1)
Sodium: 134 mmol/L — ABNORMAL LOW (ref 135–145)
Total Bilirubin: 0.5 mg/dL (ref 0.3–1.2)
Total Protein: 6.5 g/dL (ref 6.5–8.1)

## 2018-08-07 LAB — LIPASE, BLOOD: Lipase: 29 U/L (ref 11–51)

## 2018-08-07 LAB — URINALYSIS, MICROSCOPIC (REFLEX): WBC, UA: >50 WBC/hpf (ref 0–5)

## 2018-08-07 MED ORDER — POTASSIUM CHLORIDE CRYS ER 20 MEQ PO TBCR: EXTENDED_RELEASE_TABLET | ORAL | 1 refills | Status: DC

## 2018-08-07 MED ORDER — CEPHALEXIN 500 MG PO CAPS: 500.0000 mg | ORAL_CAPSULE | Freq: Three times a day (TID) | ORAL | 0 refills | Status: DC

## 2018-08-07 MED ORDER — POTASSIUM CHLORIDE CRYS ER 20 MEQ PO TBCR
40.0000 meq | EXTENDED_RELEASE_TABLET | Freq: Once | ORAL | Status: AC
  Administered 2018-08-07: 40 meq via ORAL
  Filled 2018-08-07: qty 2

## 2018-08-07 MED ORDER — IOHEXOL 300 MG/ML  SOLN
100.0000 mL | Freq: Once | INTRAMUSCULAR | Status: AC | PRN
  Administered 2018-08-07: 80 mL via INTRAVENOUS

## 2018-08-07 MED ORDER — POTASSIUM CHLORIDE 10 MEQ/100ML IV SOLN
10.0000 meq | INTRAVENOUS | Status: AC
  Administered 2018-08-07 (×2): 10 meq via INTRAVENOUS
  Filled 2018-08-07 (×2): qty 100

## 2018-08-07 MED ORDER — SODIUM CHLORIDE 0.9 % IV SOLN
1.0000 g | Freq: Once | INTRAVENOUS | Status: AC
  Administered 2018-08-07: 1 g via INTRAVENOUS
  Filled 2018-08-07: qty 10

## 2018-08-07 NOTE — Discharge Instructions (Addendum)
Drink plenty of fluids.  Take acetaminophen as needed for pain or fever.  Return if your symptoms are getting worse.

## 2018-08-09 LAB — URINE CULTURE: Culture: >=100000 — AB

## 2018-08-10 ENCOUNTER — Telehealth: Payer: Self-pay

## 2018-08-10 NOTE — Telephone Encounter (Signed)
Post ED Visit - Positive Culture Follow-up  Culture report reviewed by antimicrobial stewardship pharmacist: [facility] []  Enzo Bi, Pharm.D. []  Celedonio Miyamoto, Pharm.D., BCPS AQ-ID []  Garvin Fila, Pharm.D., BCPS []  Georgina Pillion, [address].D., BCPS []  [place], [address].D., BCPS, AAHIVP []  Estella Husk, Pharm.D., BCPS, AAHIVP [x]  Lysle Pearl, PharmD, BCPS []  Phillips Climes, PharmD, BCPS []  Agapito Games, PharmD, BCPS []  Verlan Friends, PharmD []  Mervyn Gay, PharmD, BCPS []  Vinnie Level, PharmD  [facility] []  Len Childs, PharmD []  Greer Pickerel, PharmD []  Adalberto Cole, PharmD []  Perlie Gold, Rph []  Lonell Face) Jean Rosenthal, PharmD []  Earl Many, PharmD []  Junita Push, PharmD []  Dorna Leitz, PharmD []  Terrilee Files, PharmD []  Lynann Beaver, PharmD []  Keturah Barre, PharmD []  Loralee Pacas, PharmD []  Bernadene Person, PharmD   Positive urine culture Treated with Cephalexin, organism sensitive to the same and no further patient follow-up is required at this time.  Jerry Caras 08/10/2018, 7:59 AM

## 2018-08-10 NOTE — Telephone Encounter (Signed)
Post ED Visit - Positive Culture Follow-up  Culture report reviewed by antimicrobial stewardship pharmacist: [facility] []  Enzo Bi, Pharm.D. []  Celedonio Miyamoto, Pharm.D., BCPS AQ-ID []  Garvin Fila, Pharm.D., BCPS []  Georgina Pillion, [address].D., BCPS []  [place], [place].D., BCPS, AAHIVP []  Estella Husk, Pharm.D., BCPS, AAHIVP [x]  Lysle Pearl, PharmD, BCPS []  Phillips Climes, PharmD, BCPS []  Agapito Games, PharmD, BCPS []  Verlan Friends, PharmD []  Mervyn Gay, PharmD, BCPS []  Vinnie Level, PharmD  [facility] []  Len Childs, PharmD []  Greer Pickerel, PharmD []  Adalberto Cole, PharmD []  Perlie Gold, Rph []  Lonell Face) Jean Rosenthal, PharmD []  Earl Many, PharmD []  Junita Push, PharmD []  Dorna Leitz, PharmD []  Terrilee Files, PharmD []  Lynann Beaver, PharmD []  Keturah Barre, PharmD []  Loralee Pacas, PharmD []  Bernadene Person, PharmD   Positive urine culture Treated with Cephalexin, organism sensitive to the same and no further patient follow-up is required at this time.  Jerry Caras 08/10/2018, 8:10 AM

## 2018-08-11 ENCOUNTER — Encounter: Payer: Self-pay | Admitting: Family Medicine

## 2018-08-31 ENCOUNTER — Ambulatory Visit: Payer: Medicare Other

## 2018-10-22 ENCOUNTER — Ambulatory Visit (INDEPENDENT_AMBULATORY_CARE_PROVIDER_SITE_OTHER): Payer: Medicare Other | Admitting: Family Medicine

## 2018-10-22 ENCOUNTER — Encounter: Payer: Self-pay | Admitting: Family Medicine

## 2018-10-22 ENCOUNTER — Other Ambulatory Visit: Payer: Self-pay

## 2018-10-22 DIAGNOSIS — I10 Essential (primary) hypertension: Secondary | ICD-10-CM | POA: Diagnosis not present

## 2018-10-22 DIAGNOSIS — N183 Chronic kidney disease, stage 3 unspecified: Secondary | ICD-10-CM

## 2018-10-22 DIAGNOSIS — E876 Hypokalemia: Secondary | ICD-10-CM | POA: Diagnosis not present

## 2018-10-22 DIAGNOSIS — R7989 Other specified abnormal findings of blood chemistry: Secondary | ICD-10-CM

## 2018-10-22 DIAGNOSIS — L304 Erythema intertrigo: Secondary | ICD-10-CM

## 2018-10-22 DIAGNOSIS — R945 Abnormal results of liver function studies: Secondary | ICD-10-CM

## 2018-10-22 MED ORDER — NYSTATIN 100000 UNIT/GM EX POWD: Freq: Four times a day (QID) | CUTANEOUS | 2 refills | Status: DC

## 2018-10-22 MED ORDER — CLOTRIMAZOLE-BETAMETHASONE 1-0.05 % EX CREA: 1.0000 "application " | TOPICAL_CREAM | Freq: Two times a day (BID) | CUTANEOUS | 1 refills | Status: DC

## 2018-10-22 NOTE — Progress Notes (Signed)
Virtual Visit via Video Note   I connected with Colleen Davis on 10/22/18 at  3:00 PM EDT by a video enabled telemedicine application and verified that I am speaking with the correct person using two identifiers.  Location patient: home Location provider:work office Persons participating in the virtual visit: patient, provider  I discussed the limitations of evaluation and management by telemedicine and the availability of in person appointments. The patient expressed understanding and agreed to proceed.   HPI: Colleen Davis is a 73 yo with Hx of unstable gait,anxiety,and HTN c/o pruritic rash under breast,bilateral. Negative for new medication, detergent, soap, or body product. No known insect bite or outdoor exposures to plants. No sick contact. No Hx of eczema She has had similar rash in the past, intermittent for years.  OTC medication for this problem: Hydrocortisone 1% and clotrimazole. Rash has improved. Exacerbated by hot weather and sweat. No alleviating factors identified.   She denies oral lesions/edema,cough, wheezing, dyspnea, abdominal pain, nausea, or vomiting.  HTN,she is not checking BP at home. Denies severe/frequent headache, visual changes, chest pain, palpitation, claudication, focal weakness, or edema. She is on HCTZ 25 mg daily and Amlodipine 2.5 mg daily.  HypoK+, she is on KLOR 20 meq bid.  CKD III,she has not noted foam in urine,gross hematuria, or decreased urine output.  Lab Results  Component Value Date   MICROALBUR 17.0 (H) 06/11/2018  Last eGFR 42 (53 and 36)   Lab Results  Component Value Date   CREATININE 1.29 (H) 08/07/2018   BUN 21 08/07/2018   NA 134 (L) 08/07/2018   K 2.8 (L) 08/07/2018   CL 96 (L) 08/07/2018   CO2 27 08/07/2018   Abnormal LFT's during ER visit. Lab Results  Component Value Date   ALT 50 (H) 08/07/2018   AST 71 (H) 08/07/2018   ALKPHOS 172 (H) 08/07/2018   BILITOT 0.5 08/07/2018   Negative for jaundice or  urine/stool color changes.  She is eating better since she moved with her sister and has gained wt.  Since her last OV she was evaluated in the ER because suprapubic abdominal pain. Diagnosed with UTI,completed abx treatment. Symptoms resolved. BP was elavated at 148/67.   ROS: See pertinent positives and negatives per HPI.  Past Medical History:  Diagnosis Date  . Anxiety   . Cardiomyopathy ([place]) 08/2016   EF 35% with suggestion of anterior wall motion abnormality. Cannot exclude ischemic versus Takotsubo  . Chicken pox   . Chronic kidney disease    polycystic  . Dehydration    recently hosp  . Depression   . History of kidney stones    cyst on kidney  . Hypertension   . Hypokalemia     Past Surgical History:  Procedure Laterality Date  . DILATION AND CURETTAGE OF UTERUS    . NM MYOVIEW LTD  09/14/2016   Normal EF 55-65%. Normal wall motion. No evidence of ischemia or infarction.  . TRANSTHORACIC ECHOCARDIOGRAM  08/18/2016   ** in setting of sepsis/cholecystitis: EF 30-35% with severe hypokinesis of the distal septal, distal inferior, mid/distal anterior, mid/distal anterolateral walls and Akinesis of the apex. The cavity size was normal. Wall thickness was normal. Trivial Aortic regurgitation. Mitral Mild MR  . TUBAL LIGATION      Family History  Problem Relation Age of Onset  . Mental illness Mother        "Dementia"  . Hyperlipidemia Mother   . Lung cancer Father   . Hypertension Father   .  Hypertension Sister   . Other Sister        non-alcohol fatty disease  . Colon cancer Sister     Social History   Socioeconomic History  . Marital status: Single    Spouse name: Not on file  . Number of children: Not on file  . Years of education: Not on file  . Highest education level: Not on file  Occupational History  . Not on file  Social Needs  . Financial resource strain: Not on file  . Food insecurity    Worry: Not on file    Inability: Not on file  .  Transportation needs    Medical: Not on file    Non-medical: Not on file  Tobacco Use  . Smoking status: Never Smoker  . Smokeless tobacco: Never Used  Substance and Sexual Activity  . Alcohol use: No  . Drug use: No  . Sexual activity: Not Currently  Lifestyle  . Physical activity    Days per week: Not on file    Minutes per session: Not on file  . Stress: Not on file  Relationships  . Social Herbalist on phone: Not on file    Gets together: Not on file    Attends religious service: Not on file    Active member of club or organization: Not on file    Attends meetings of clubs or organizations: Not on file    Relationship status: Not on file  . Intimate partner violence    Fear of current or ex partner: Not on file    Emotionally abused: Not on file    Physically abused: Not on file    Forced sexual activity: Not on file  Other Topics Concern  . Not on file  Social History Narrative  . Not on file      Current Outpatient Medications:  .  amLODipine (NORVASC) 2.5 MG tablet, Take 1 tablet (2.5 mg total) by mouth daily., Disp: 90 tablet, Rfl: 1 .  aspirin EC 81 MG tablet, Take 1 tablet (81 mg total) by mouth daily., Disp: 30 tablet, Rfl: 0 .  atorvastatin (LIPITOR) 20 MG tablet, Take 1 tablet (20 mg total) by mouth daily at 6 PM., Disp: 90 tablet, Rfl: 1 .  clotrimazole-betamethasone (LOTRISONE) cream, Apply 1 application topically 2 (two) times daily., Disp: 30 g, Rfl: 1 .  folic acid (FOLVITE) 1 MG tablet, Take 1 tablet (1 mg total) by mouth daily., Disp: 90 tablet, Rfl: 0 .  hydrochlorothiazide (HYDRODIURIL) 25 MG tablet, Take 1 tablet (25 mg total) by mouth daily., Disp: 90 tablet, Rfl: 2 .  nystatin (MYCOSTATIN/NYSTOP) powder, Apply topically 4 (four) times daily., Disp: 15 g, Rfl: 2 .  nystatin (NYSTATIN) powder, Apply topically 4 (four) times daily., Disp: 15 g, Rfl: 0 .  omeprazole (PRILOSEC) 20 MG capsule, Take 1 capsule (20 mg total) by mouth daily.,  Disp: 90 capsule, Rfl: 1 .  polyethylene glycol (MIRALAX / GLYCOLAX) packet, Take 17 g by mouth daily., Disp: 14 each, Rfl: 0 .  potassium chloride SA (KLOR-CON M20) 20 MEQ tablet, TAKE 1 TABLET TWICE DAILYAILY., Disp: 60 tablet, Rfl: 1 .  thiamine (VITAMIN B-1) 100 MG tablet, Take 1 tablet (100 mg total) by mouth daily., Disp: 90 tablet, Rfl: 0  EXAM:  VITALS per patient if applicable:N/A  GENERAL: alert, oriented, appears well and in no acute distress  HEENT: atraumatic, conjunctiva clear, no obvious facial abnormalities on inspection.  NECK: normal movements  of the head and neck  LUNGS: on inspection no signs of respiratory distress, breathing rate appears normal, no obvious gross SOB, gasping or wheezing  CV: no obvious cyanosis  Colleen: moves all visible extremities without noticeable abnormality  SKIN: Minimal macular erythematous area under right breast.   PSYCH/NEURO: pleasant and cooperative, no obvious depression or anxiety, speech and thought processing grossly intact  ASSESSMENT AND PLAN:  Discussed the following assessment and plan: Orders Placed This Encounter  Procedures  . Microalbumin / creatinine urine ratio  . Comprehensive metabolic panel     HTN (hypertension) Mildly elevated during ER visit. Recommend monitoring BP at home. No changes in current management. Low salt diet recommended.   Intertrigo Educated about Dx,prognosis,and treatment options. Keep area dry. Lotrisone cream bid prn. Nystatin powder daily prn.   Hypokalemia No changes in KLOR 20 meq bid. Instructed about warning signs. HCTZ may need to be discontinued if persistent problem despite treatment. Future lab will be arranged.   CKD (chronic kidney disease), stage III (HCC) Low salt diet. Avoid NSAID's. Adequate hydration. She is not on a ARB or ACEI. Will arrange lab appt.   Abnormal LFTs Will arrange lab appt and further recommendations will be given according to lab  results.  I discussed the assessment and treatment plan with the patient. She was provided an opportunity to ask questions and all were answered. She agreed with the plan and demonstrated an understanding of the instructions.   The patient was advised to call back or seek an in-person evaluation if the symptoms worsen or if the condition fails to improve as anticipated.  Return in about 4 months (around 02/22/2019) for HTN,hypoK+,CKD III.    Betty Martinique, MD

## 2018-10-22 NOTE — Assessment & Plan Note (Signed)
Low salt diet. Avoid NSAID's. Adequate hydration. She is not on a ARB or ACEI. Will arrange lab appt.

## 2018-10-22 NOTE — Assessment & Plan Note (Addendum)
Mildly elevated during ER visit. Recommend monitoring BP at home. No changes in current management. Low salt diet recommended.

## 2018-10-22 NOTE — Assessment & Plan Note (Signed)
Educated about Dx,prognosis,and treatment options. Keep area dry. Lotrisone cream bid prn. Nystatin powder daily prn.

## 2018-10-22 NOTE — Assessment & Plan Note (Signed)
No changes in KLOR 20 meq bid. Instructed about warning signs. HCTZ may need to be discontinued if persistent problem despite treatment. Future lab will be arranged.

## 2018-10-23 ENCOUNTER — Telehealth: Payer: Self-pay | Admitting: *Deleted

## 2018-10-23 NOTE — Telephone Encounter (Signed)
LM for patient to call office to schedule lab appointment per Dr. Martinique.

## 2018-10-24 ENCOUNTER — Encounter: Payer: Self-pay | Admitting: *Deleted

## 2018-10-25 ENCOUNTER — Other Ambulatory Visit: Payer: Self-pay | Admitting: Family Medicine

## 2018-10-25 DIAGNOSIS — I10 Essential (primary) hypertension: Secondary | ICD-10-CM

## 2018-10-26 ENCOUNTER — Other Ambulatory Visit: Payer: Self-pay | Admitting: Family Medicine

## 2018-10-26 DIAGNOSIS — E876 Hypokalemia: Secondary | ICD-10-CM

## 2018-10-29 ENCOUNTER — Other Ambulatory Visit: Payer: Self-pay | Admitting: Family Medicine

## 2018-10-29 DIAGNOSIS — I10 Essential (primary) hypertension: Secondary | ICD-10-CM

## 2018-10-30 MED ORDER — HYDROCHLOROTHIAZIDE 25 MG PO TABS: 25.0000 mg | ORAL_TABLET | Freq: Every day | ORAL | 0 refills | Status: DC

## 2018-11-01 ENCOUNTER — Other Ambulatory Visit: Payer: Self-pay

## 2018-11-01 ENCOUNTER — Other Ambulatory Visit (INDEPENDENT_AMBULATORY_CARE_PROVIDER_SITE_OTHER): Payer: Medicare Other

## 2018-11-01 DIAGNOSIS — I10 Essential (primary) hypertension: Secondary | ICD-10-CM | POA: Diagnosis not present

## 2018-11-01 DIAGNOSIS — E876 Hypokalemia: Secondary | ICD-10-CM | POA: Diagnosis not present

## 2018-11-01 DIAGNOSIS — N183 Chronic kidney disease, stage 3 unspecified: Secondary | ICD-10-CM

## 2018-11-01 LAB — COMPREHENSIVE METABOLIC PANEL
ALT: 9 U/L (ref 0–35)
AST: 15 U/L (ref 0–37)
Albumin: 4.5 g/dL (ref 3.5–5.2)
Alkaline Phosphatase: 110 U/L (ref 39–117)
BUN: 19 mg/dL (ref 6–23)
CO2: 31 mEq/L (ref 19–32)
Calcium: 9.5 mg/dL (ref 8.4–10.5)
Chloride: 101 mEq/L (ref 96–112)
Creatinine, Ser: 0.98 mg/dL (ref 0.40–1.20)
GFR: 55.78 mL/min — ABNORMAL LOW (ref >60.00)
Glucose, Bld: 88 mg/dL (ref 70–99)
Potassium: 3.7 mEq/L (ref 3.5–5.1)
Sodium: 141 mEq/L (ref 135–145)
Total Bilirubin: 0.6 mg/dL (ref 0.2–1.2)
Total Protein: 6.8 g/dL (ref 6.0–8.3)

## 2018-11-05 ENCOUNTER — Other Ambulatory Visit: Payer: Self-pay | Admitting: Family Medicine

## 2018-11-06 ENCOUNTER — Other Ambulatory Visit: Payer: Self-pay | Admitting: Family Medicine

## 2018-11-06 DIAGNOSIS — R110 Nausea: Secondary | ICD-10-CM

## 2018-11-12 ENCOUNTER — Encounter: Payer: Self-pay | Admitting: Family Medicine

## 2018-12-18 ENCOUNTER — Other Ambulatory Visit: Payer: Self-pay | Admitting: Family Medicine

## 2018-12-18 DIAGNOSIS — I10 Essential (primary) hypertension: Secondary | ICD-10-CM

## 2019-01-12 IMAGING — CR DG ABDOMEN ACUTE W/ 1V CHEST
3 series · 3 of 3 positions shown · non-contrast
Comparison: None.

CLINICAL DATA: Abdominal pain

EXAM:
DG ABDOMEN ACUTE W/ 1V CHEST

[w chest pa]
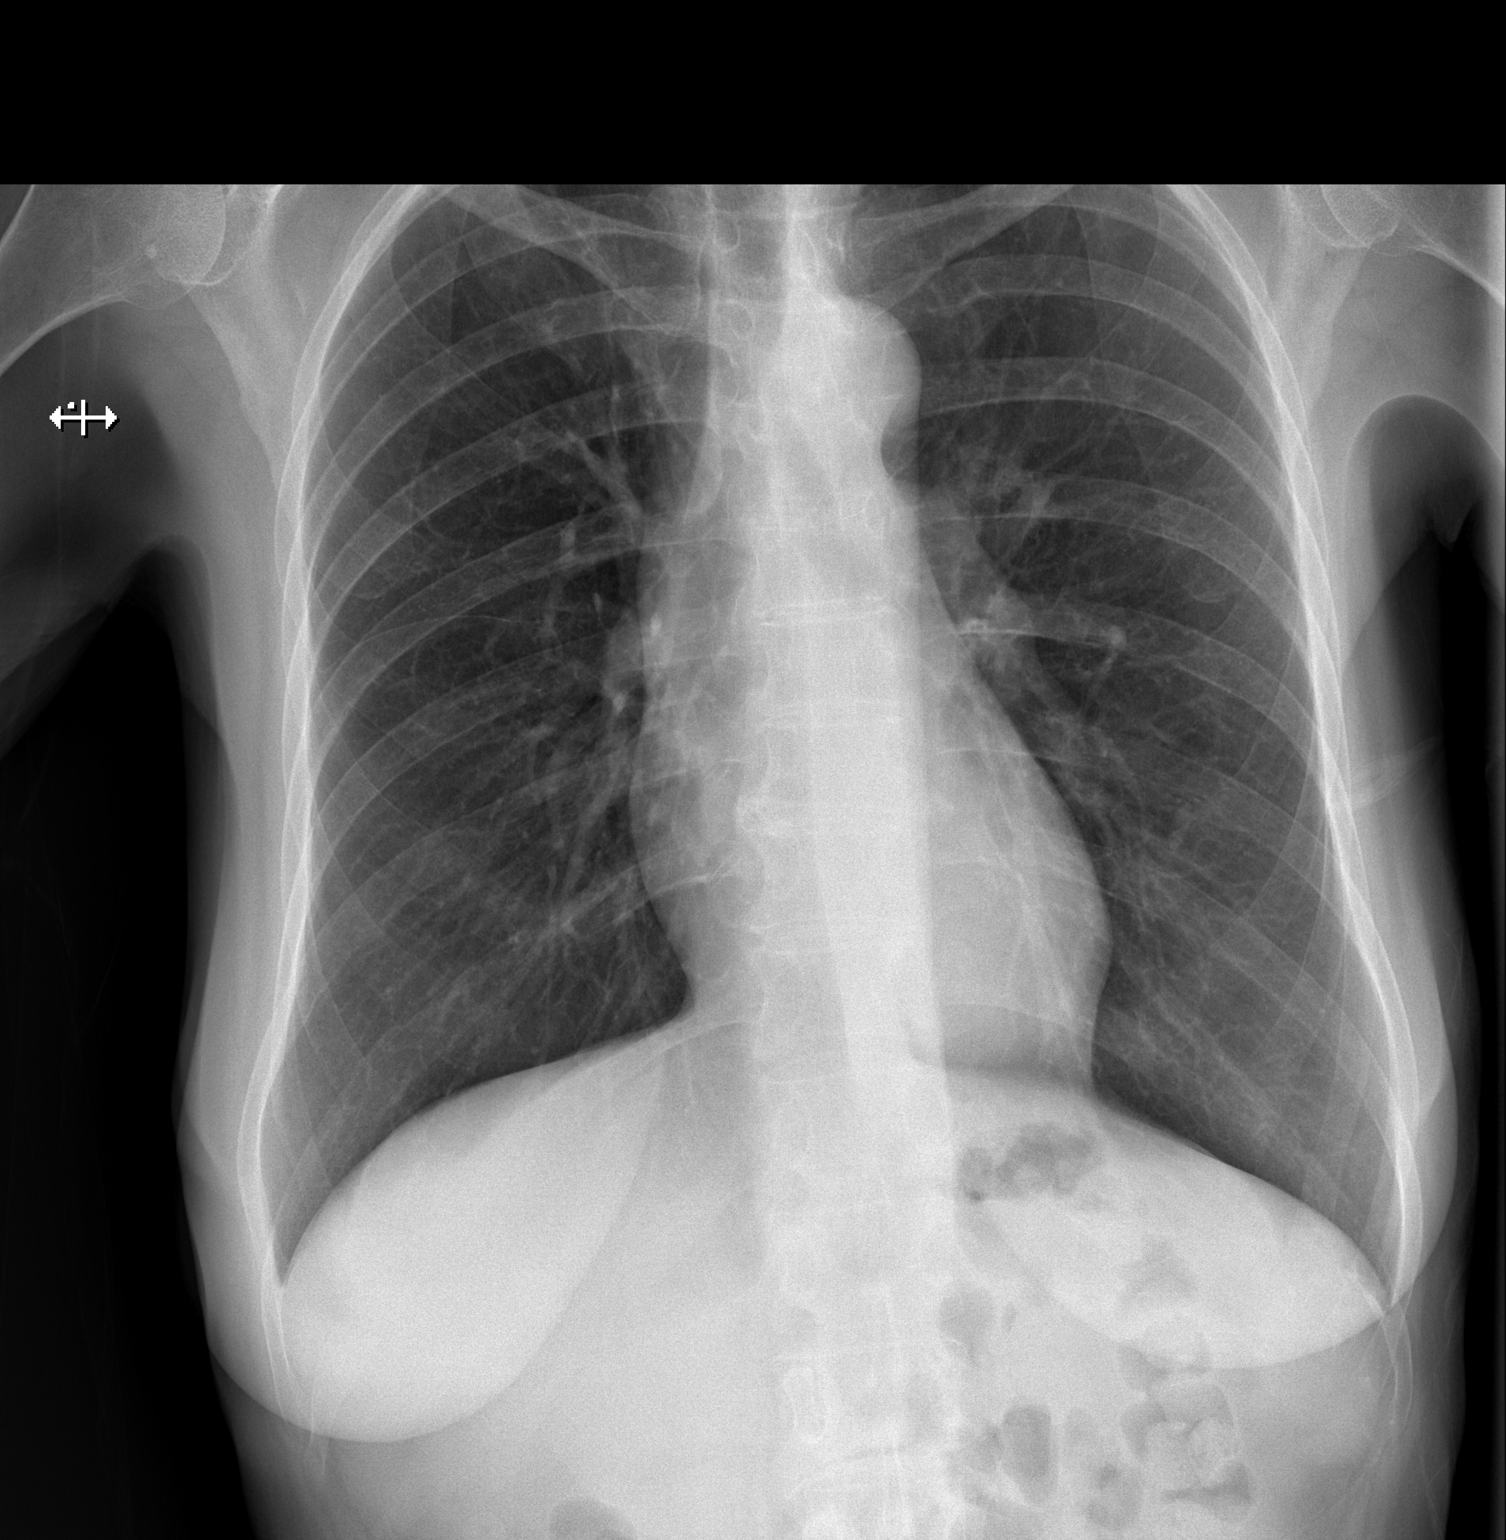

[w abdomen upright]
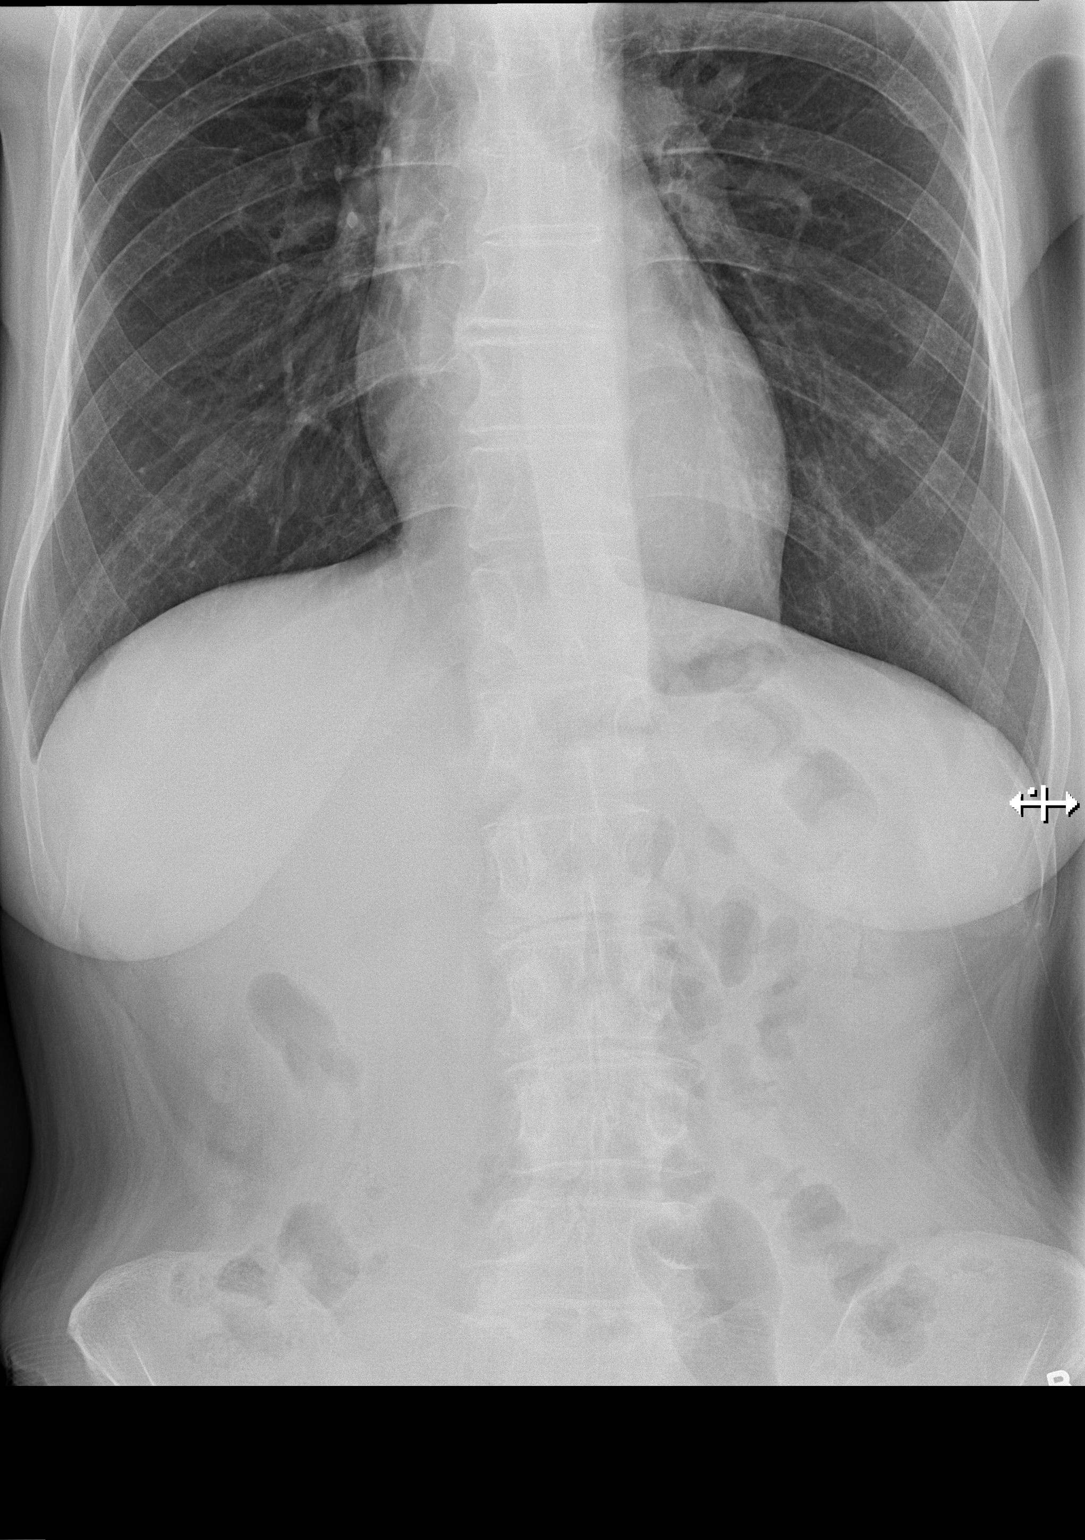

[t abdomen supine]
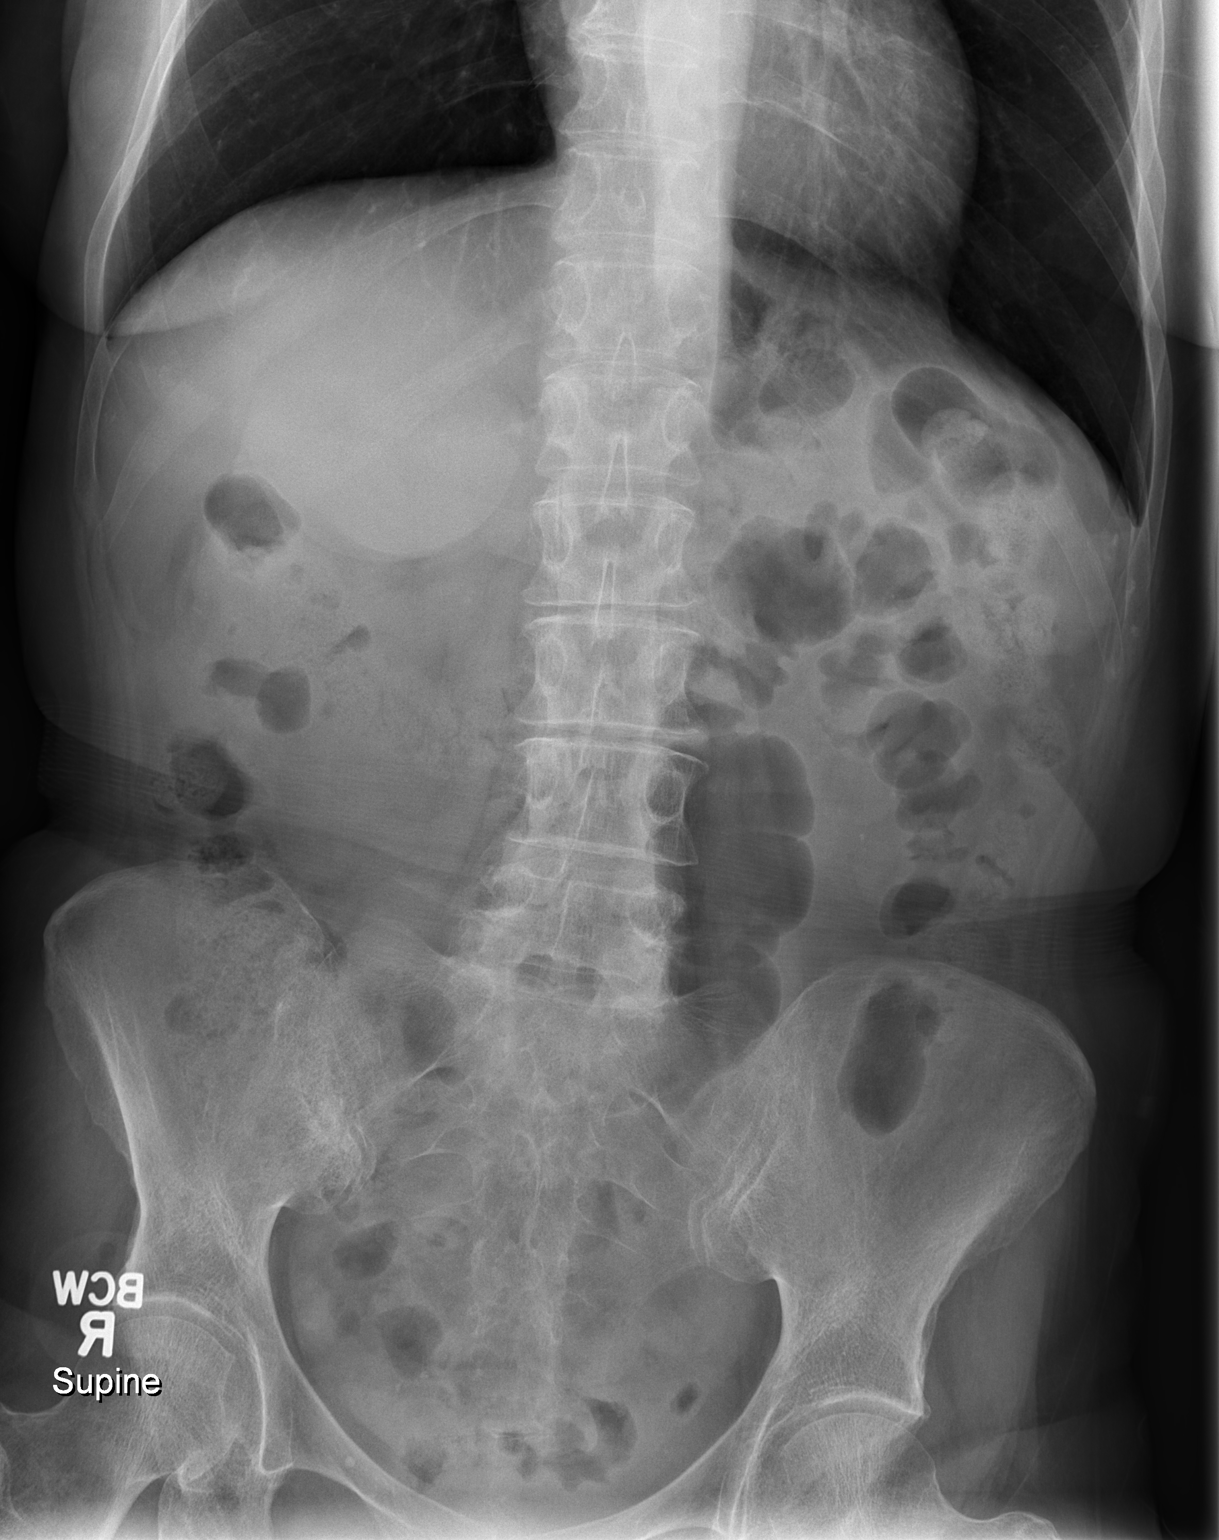

[3 of 3 positions shown; findings below may reference images not displayed]

FINDINGS: There is no evidence of dilated bowel loops or free intraperitoneal
air. No radiopaque calculi or other significant radiographic
abnormality is seen. Heart size and mediastinal contours are within
normal limits. Both lungs are clear.
IMPRESSION: Negative abdominal radiographs.  No acute cardiopulmonary disease.

## 2019-01-20 IMAGING — MR MR HEAD W/O CM
7 of 8 series · 39 of 48 positions shown · non-contrast
Comparison: Head CT without contrast 08/18/2016. Brain MRI
11/17/2011.

CLINICAL DATA: 70-year-old female With acute encephalopathy.
Confusion and lethargy. Ocular dysmotility.

EXAM:
MRI HEAD WITHOUT CONTRAST
TECHNIQUE: Multiplanar, multiecho pulse sequences of the brain and surrounding
structures were obtained without intravenous contrast.

[Series 3: DWI · axial · 3.0mm · 1.09mm/px · z∈[-79,+68]mm · 9 of 97 slices shown (1 of 4)]
[im 1/97]
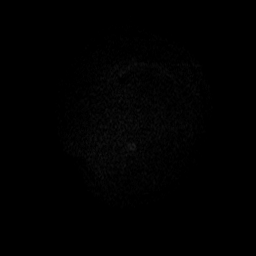
[im 17/97]
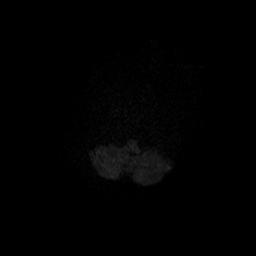
[im 33/97]
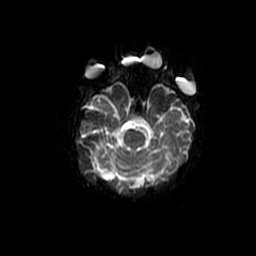
[im 41/97]
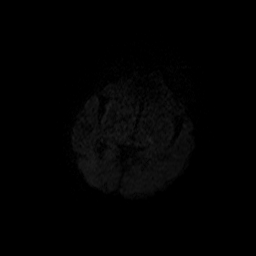
[im 49/97]
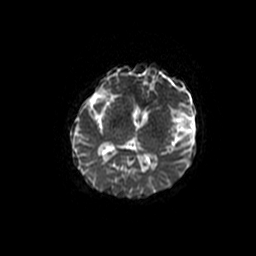
[im 57/97]
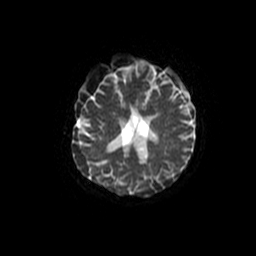
[im 65/97]
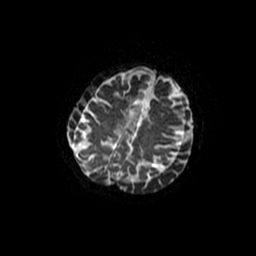
[im 81/97]
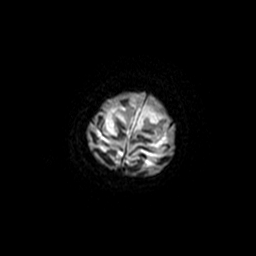
[im 97/97]
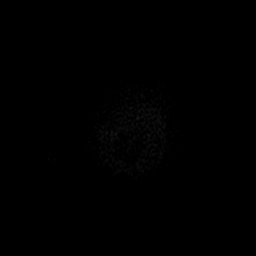

[Series 4: DWI · coronal · 5.0mm · 1.09mm/px · 9 of 68 slices shown (2 of 4)]
[im 1/68]
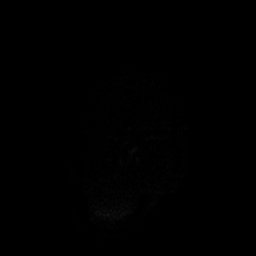
[im 9/68]
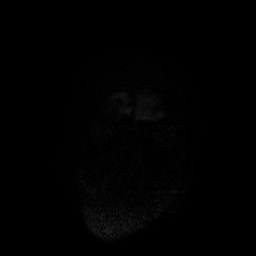
[im 17/68]
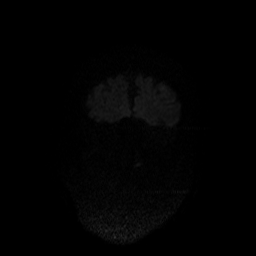
[im 26/68]
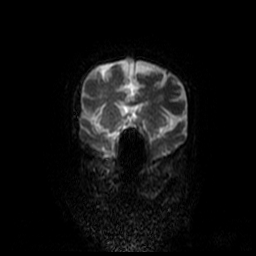
[im 34/68]
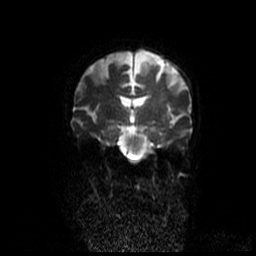
[im 42/68]
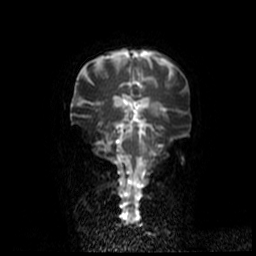
[im 51/68]
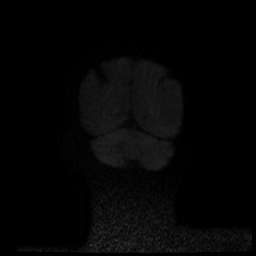
[im 59/68]
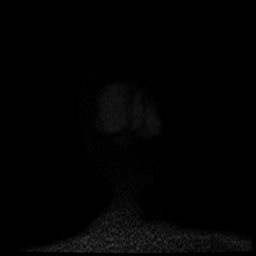
[im 68/68]
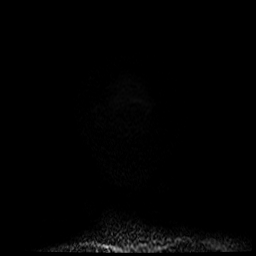

[Series 5: T2 · axial · 5.0mm · 0.43mm/px · z∈[-77,+73]mm · 4 of 26 slices shown]
[im 1/26]
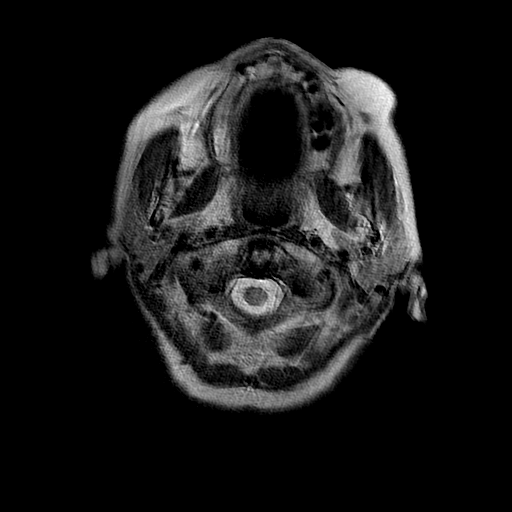
[im 9/26]
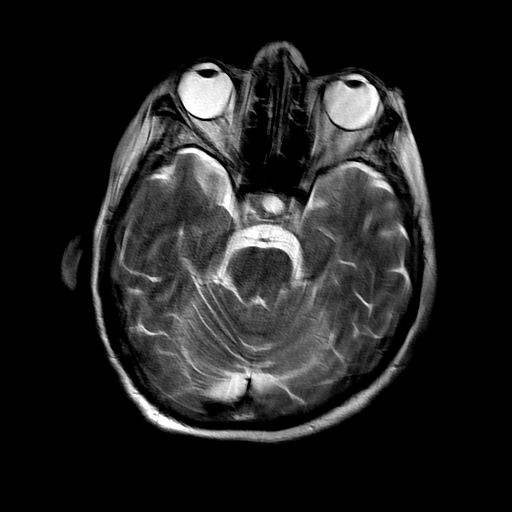
[im 17/26]
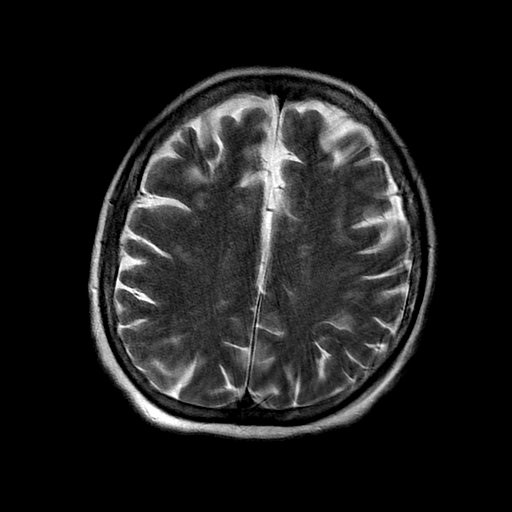
[im 26/26]
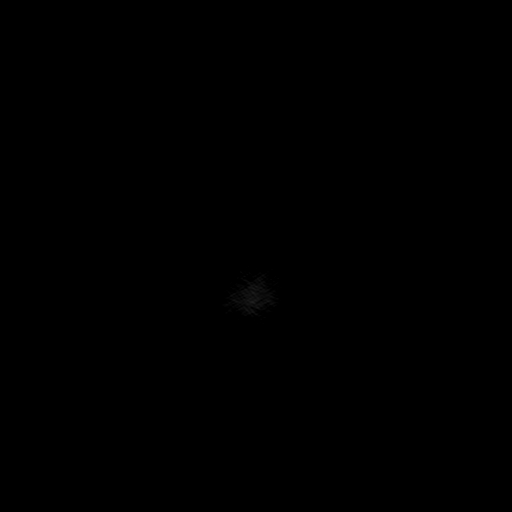

[Series 6: FLAIR · axial · 5.0mm · 0.43mm/px · z∈[-77,+73]mm · 4 of 26 slices shown]
[im 1/26]
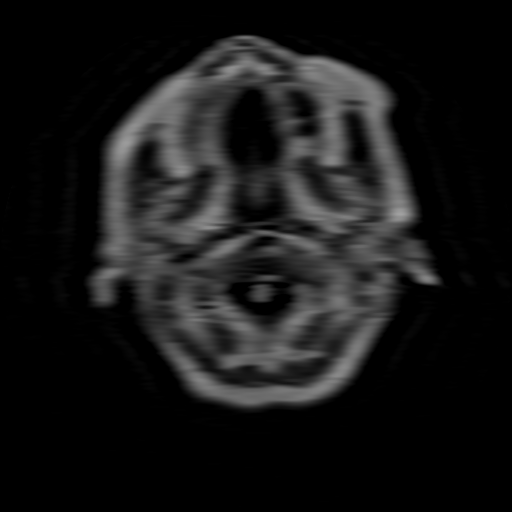
[im 9/26]
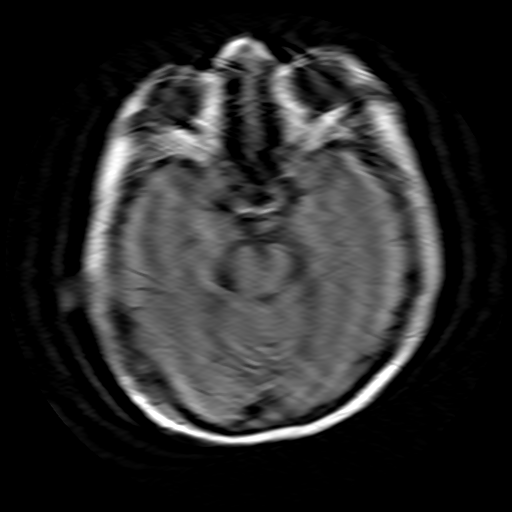
[im 17/26]
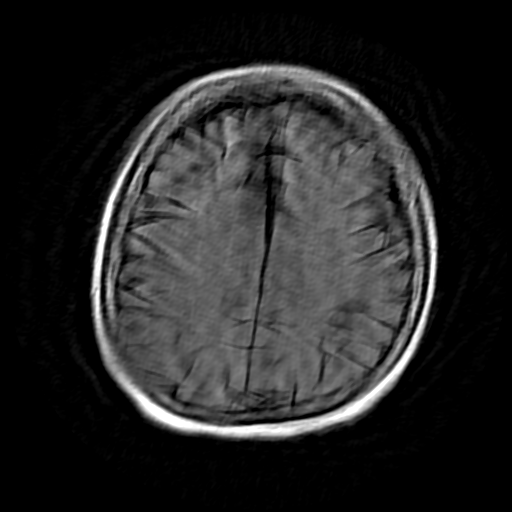
[im 26/26]
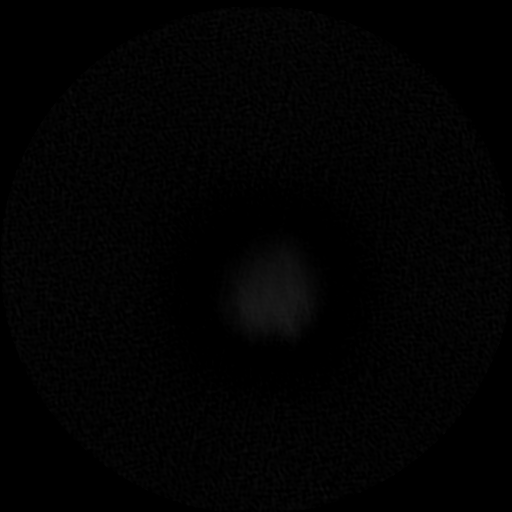

[Series 8: T1 · sagittal · 5.0mm · 0.47mm/px · 1 of 25 slices shown]
[im 1/25]
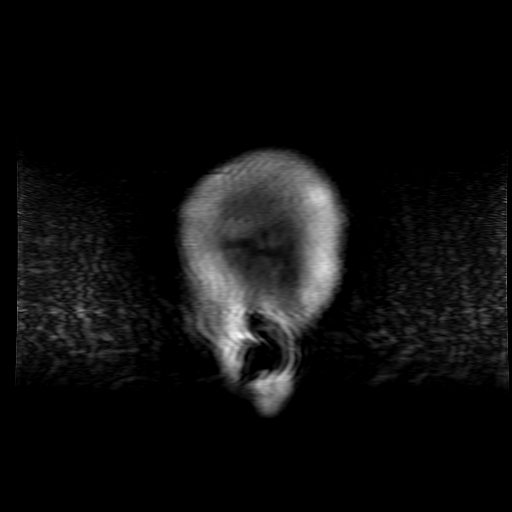

[Series 300: DWI · axial · 3.0mm · 1.09mm/px · z∈[-79,+68]mm · 7 of 50 slices shown (3 of 4)]
[im 1/50]
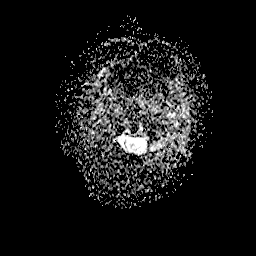
[im 9/50]
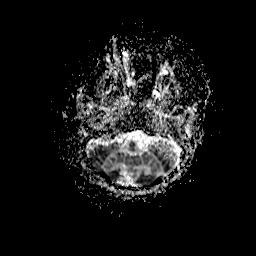
[im 17/50]
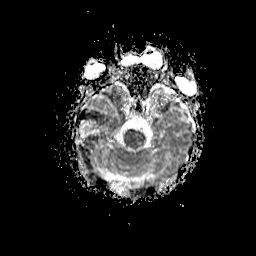
[im 25/50]
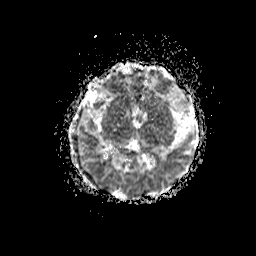
[im 33/50]
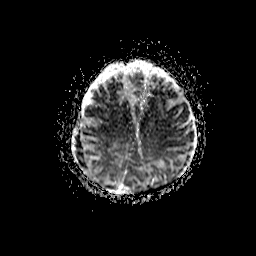
[im 41/50]
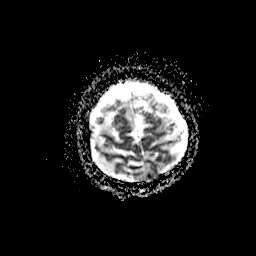
[im 50/50]
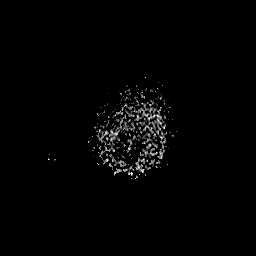

[Series 400: DWI · coronal · 5.0mm · 1.09mm/px · 5 of 34 slices shown (4 of 4)]
[im 1/34]
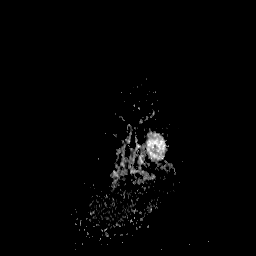
[im 9/34]
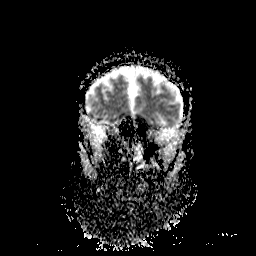
[im 17/34]
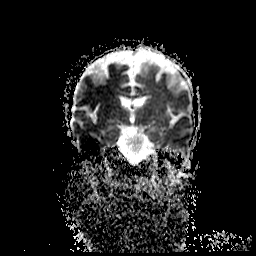
[im 25/34]
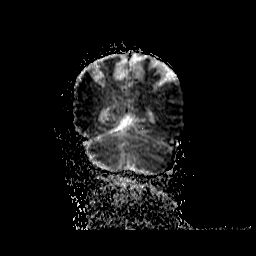
[im 34/34]
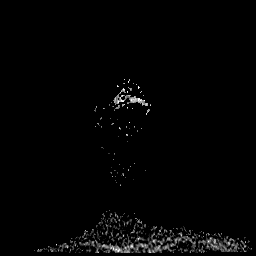

[39 of 48 positions shown; findings below may reference images not displayed]

FINDINGS: The examination had to be discontinued prior to completion due to
patient agitation. Intermittently motion degraded diffusion imaging,
sagittal T1 weighted imaging, and axial T2, FLAIR and T2* imaging
was obtained.

Brain: Cerebral volume is not significantly changed since 9513 and
is within normal limits for age.

No restricted diffusion identified.

No midline shift, mass effect, evidence of mass lesion,
ventriculomegaly, extra-axial collection or acute intracranial
hemorrhage.

No abnormal T2 signal identified in the deep gray matter nuclei,
midbrain, periaqueductal gray, brainstem or cerebellum. Scattered
subcortical cerebral white matter T2 and FLAIR hyperintensity was
better demonstrated in 9513 but the extent appears not significantly
changed. No cortical encephalomalacia or chronic cerebral blood
products are identified.

Vascular: Major intracranial vascular flow voids appear stable since
9513.

Skull and upper cervical spine: Grossly negative.

Sinuses/Orbits: Stable and negative.

Other: Mild left mastoid effusion.  Negative visible nasopharynx.
IMPRESSION: No acute intracranial abnormality and no change from the 9513 brain
MRI identified on this intermittently motion degraded study which
had to be discontinued prior to completion due to patient agitation
despite medical anxiolysis.

## 2019-01-23 ENCOUNTER — Other Ambulatory Visit: Payer: Self-pay | Admitting: Family Medicine

## 2019-01-23 DIAGNOSIS — E876 Hypokalemia: Secondary | ICD-10-CM

## 2019-01-30 ENCOUNTER — Other Ambulatory Visit: Payer: Self-pay | Admitting: Family Medicine

## 2019-02-13 ENCOUNTER — Other Ambulatory Visit: Payer: Self-pay | Admitting: Family Medicine

## 2019-03-17 ENCOUNTER — Other Ambulatory Visit: Payer: Self-pay | Admitting: Family Medicine

## 2019-03-17 DIAGNOSIS — I10 Essential (primary) hypertension: Secondary | ICD-10-CM

## 2019-04-09 ENCOUNTER — Other Ambulatory Visit: Payer: Self-pay | Admitting: Family Medicine

## 2019-04-09 DIAGNOSIS — E876 Hypokalemia: Secondary | ICD-10-CM

## 2019-05-13 ENCOUNTER — Other Ambulatory Visit: Payer: Self-pay | Admitting: Family Medicine

## 2019-05-13 DIAGNOSIS — R1013 Epigastric pain: Secondary | ICD-10-CM

## 2019-05-22 ENCOUNTER — Encounter: Payer: Self-pay | Admitting: Cardiology

## 2019-05-28 ENCOUNTER — Encounter: Payer: Self-pay | Admitting: Cardiology

## 2019-06-05 ENCOUNTER — Other Ambulatory Visit: Payer: Self-pay | Admitting: Family Medicine

## 2019-06-05 DIAGNOSIS — I10 Essential (primary) hypertension: Secondary | ICD-10-CM

## 2019-06-21 ENCOUNTER — Other Ambulatory Visit: Payer: Self-pay

## 2019-06-24 ENCOUNTER — Ambulatory Visit (INDEPENDENT_AMBULATORY_CARE_PROVIDER_SITE_OTHER): Payer: Medicare Other | Admitting: Family Medicine

## 2019-06-24 ENCOUNTER — Encounter: Payer: Self-pay | Admitting: Family Medicine

## 2019-06-24 ENCOUNTER — Other Ambulatory Visit: Payer: Self-pay

## 2019-06-24 VITALS — BP 128/78 | HR 83 | Resp 16 | Ht 61.0 in | Wt 110.0 lb

## 2019-06-24 DIAGNOSIS — R002 Palpitations: Secondary | ICD-10-CM | POA: Diagnosis not present

## 2019-06-24 DIAGNOSIS — I10 Essential (primary) hypertension: Secondary | ICD-10-CM | POA: Diagnosis not present

## 2019-06-24 DIAGNOSIS — I5020 Unspecified systolic (congestive) heart failure: Secondary | ICD-10-CM

## 2019-06-24 DIAGNOSIS — E876 Hypokalemia: Secondary | ICD-10-CM | POA: Diagnosis not present

## 2019-06-24 DIAGNOSIS — R110 Nausea: Secondary | ICD-10-CM

## 2019-06-24 DIAGNOSIS — L03319 Cellulitis of trunk, unspecified: Secondary | ICD-10-CM

## 2019-06-24 DIAGNOSIS — N1831 Chronic kidney disease, stage 3a: Secondary | ICD-10-CM

## 2019-06-24 DIAGNOSIS — L304 Erythema intertrigo: Secondary | ICD-10-CM

## 2019-06-24 DIAGNOSIS — E559 Vitamin D deficiency, unspecified: Secondary | ICD-10-CM

## 2019-06-24 LAB — COMPREHENSIVE METABOLIC PANEL
ALT: 8 U/L (ref 0–35)
AST: 18 U/L (ref 0–37)
Albumin: 4.2 g/dL (ref 3.5–5.2)
Alkaline Phosphatase: 116 U/L (ref 39–117)
BUN: 21 mg/dL (ref 6–23)
CO2: 33 mEq/L — ABNORMAL HIGH (ref 19–32)
Calcium: 9.9 mg/dL (ref 8.4–10.5)
Chloride: 101 mEq/L (ref 96–112)
Creatinine, Ser: 1.05 mg/dL (ref 0.40–1.20)
GFR: 51.42 mL/min — ABNORMAL LOW (ref >60.00)
Glucose, Bld: 100 mg/dL — ABNORMAL HIGH (ref 70–99)
Potassium: 3 mEq/L — ABNORMAL LOW (ref 3.5–5.1)
Sodium: 143 mEq/L (ref 135–145)
Total Bilirubin: 0.7 mg/dL (ref 0.2–1.2)
Total Protein: 6.8 g/dL (ref 6.0–8.3)

## 2019-06-24 LAB — CBC
HCT: 40.5 % (ref 36.0–46.0)
Hemoglobin: 13.7 g/dL (ref 12.0–15.0)
MCHC: 33.8 g/dL (ref 30.0–36.0)
MCV: 96.2 fl (ref 78.0–100.0)
Platelets: 288 10*3/uL (ref 150.0–400.0)
RBC: 4.22 Mil/uL (ref 3.87–5.11)
RDW: 14.4 % (ref 11.5–15.5)
WBC: 9.5 10*3/uL (ref 4.0–10.5)

## 2019-06-24 LAB — MICROALBUMIN / CREATININE URINE RATIO
Creatinine,U: 119.4 mg/dL
Microalb Creat Ratio: 7.4 mg/g (ref 0.0–30.0)
Microalb, Ur: 8.9 mg/dL — ABNORMAL HIGH (ref 0.0–1.9)

## 2019-06-24 LAB — TSH: TSH: 1.7 u[IU]/mL (ref 0.35–4.50)

## 2019-06-24 LAB — VITAMIN D 25 HYDROXY (VIT D DEFICIENCY, FRACTURES): VITD: 46.81 ng/mL (ref 30.00–100.00)

## 2019-06-24 MED ORDER — CLOTRIMAZOLE-BETAMETHASONE 1-0.05 % EX CREA: 1.0000 "application " | TOPICAL_CREAM | Freq: Two times a day (BID) | CUTANEOUS | 1 refills | Status: DC

## 2019-06-24 MED ORDER — CEPHALEXIN 500 MG PO CAPS: 500.0000 mg | ORAL_CAPSULE | Freq: Two times a day (BID) | ORAL | 0 refills | Status: AC

## 2019-06-24 MED ORDER — OMEPRAZOLE 40 MG PO CPDR: 40.0000 mg | DELAYED_RELEASE_CAPSULE | Freq: Every day | ORAL | 1 refills | Status: DC

## 2019-06-24 MED ORDER — NYSTATIN 100000 UNIT/GM EX POWD: Freq: Three times a day (TID) | CUTANEOUS | 3 refills | Status: AC

## 2019-06-24 NOTE — Assessment & Plan Note (Signed)
This problem seems to be chronic. We discussed possible etiologies, including cholelithiasis even though she is not reporting symptoms after food intake. ?  Dyspepsia/GERD. RUQ [place] will be arranged. She was clearly instructed about warning signs.

## 2019-06-24 NOTE — Assessment & Plan Note (Signed)
We discussed diagnosis, prognosis, and treatment options. Nystatin powder and Lotrisone cream helped in the past, so resume them. Recommend keeping area clean with soap and water and to use a bra.

## 2019-06-24 NOTE — Progress Notes (Signed)
Chief Complaint  Patient presents with  . Nausea    HPI:  [name] is a 74 y.o. female with Hx of HTN,HLD,CHF, vertigo,and hypo K+ who is here today with her sister c/o nausea for the past 2 weeks. She was last seen on 10/22/18. She has been living with her sister since then. Wt has been stable.  She has had intermittent episodes of nausea for about a year. She is not sure about exacerbating factors but it is alleviated by eating crackers or cookies. No associated abdominal pain,vomiting, heartburn,of melena.  Mentions that sometimes she feels a "lump" in her throat. She used to have "bad" heartburn. Hx of hiatal hernia,small,she is on Omeprazole 20 mgh. She is not taking PPI daily.   Cholelithiasis seen on abdominal/pelvic CT on 08/07/2018: Small hiatal hernia.  Esophagus is fluid-filled, likely related to reflux or dysmotility. Cholelithiasis. Mild gallbladder distention.  Lab Results  Component Value Date   ALT 9 11/01/2018   AST 15 11/01/2018   ALKPHOS 110 11/01/2018   BILITOT 0.6 11/01/2018   HTN: Currently she is on HCTZ 25 mg daily and amlodipine 2.5 mg daily. She is not checking BP regularly. Denies severe/frequent headache, visual changes, chest pain, dyspnea, focal weakness, or edema.  She mentions that she sometimes feels like she is "skipping beats." She had some while she was hospitalized in 08/2016 but seem to be more frequent. Denies associated CP or SOB.  She is not sure about exacerbating or alleviating factors.  Lab Results  Component Value Date   CREATININE 0.98 11/01/2018   BUN 19 11/01/2018   NA 141 11/01/2018   K 3.7 11/01/2018   CL 101 11/01/2018   CO2 31 11/01/2018   HypoK+: She is taking KLOR 20 meq daily, last refill 03/2019 #90.  Echo 08/18/16:Left ventricle: LVEF is approximately 30 to 35% with severe  hypokinesis of the distal septal, distal inferior, mid/distal anterior, mid/distal anterolateral walls and Akinesis of  the apex. The cavity size was normal.  Negative for orthopnea and PND.  Vit D def: She is on Vit D 5000 U daily.  Pruritic rash under breast. She has had problem before. Topical steroid helped before. This time she has had mild tenderness. She has not tried OTC medication.  Review of Systems  Constitutional: Positive for fatigue. Negative for activity change, appetite change and fever.  HENT: Negative for mouth sores, nosebleeds, sore throat and trouble swallowing.   Respiratory: Negative for cough and wheezing.   Gastrointestinal: Negative for abdominal pain.  Genitourinary: Negative for decreased urine volume, difficulty urinating, dysuria and hematuria.  Musculoskeletal: Positive for gait problem. Negative for myalgias.  Skin: Negative for wound.  Neurological: Positive for dizziness (No more than usual). Negative for syncope and facial asymmetry.  Psychiatric/Behavioral: Negative for confusion. The patient is not nervous/anxious.   Rest of ROS, see pertinent positives sand negatives in HPI  Current Outpatient Medications on File Prior to Visit  Medication Sig Dispense Refill  . amLODipine (NORVASC) 2.5 MG tablet TAKE 1 TABLET BY MOUTH EVERY DAY 90 tablet 1  . aspirin EC 81 MG tablet Take 1 tablet (81 mg total) by mouth daily. 30 tablet 0  . atorvastatin (LIPITOR) 20 MG tablet TAKE 1 TABLET (20 MG TOTAL) BY MOUTH DAILY AT 6 PM. 90 tablet 1  . folic acid (FOLVITE) 1 MG tablet Take 1 tablet (1 mg total) by mouth daily. 90 tablet 0  . hydrochlorothiazide (HYDRODIURIL) 25 MG tablet TAKE  1 TABLET BY MOUTH EVERY DAY 90 tablet 1  . nystatin (NYSTATIN) powder Apply topically 4 (four) times daily. 15 g 0  . ondansetron (ZOFRAN-ODT) 8 MG disintegrating tablet TAKE 1 TABLET BY MOUTH EVERY 8 HOURS AS NEEDED FOR NAUSEA OR VOMITING FOR UP TO 5 DAYS 15 tablet 0  . polyethylene glycol (MIRALAX / GLYCOLAX) packet Take 17 g by mouth daily. 14 each 0  . potassium chloride SA (KLOR-CON M20) 20 MEQ  tablet TAKE 1 TABLET TWICE DAILY FOR 7 DAYS THEN CONTINUE 1 TABLET DAILY. 90 tablet 0  . thiamine (VITAMIN B-1) 100 MG tablet Take 1 tablet (100 mg total) by mouth daily. 90 tablet 0   No current facility-administered medications on file prior to visit.   Past Medical History:  Diagnosis Date  . Anxiety   . Cardiomyopathy ([place]) 08/2016   EF 35% with suggestion of anterior wall motion abnormality. Cannot exclude ischemic versus Takotsubo  . Chicken pox   . Chronic kidney disease    polycystic  . Dehydration    recently hosp  . Depression   . History of kidney stones    cyst on kidney  . Hypertension   . Hypokalemia    Allergies  Allergen Reactions  . No Known Allergies    Social History   Socioeconomic History  . Marital status: Single    Spouse name: Not on file  . Number of children: Not on file  . Years of education: Not on file  . Highest education level: Not on file  Occupational History  . Not on file  Tobacco Use  . Smoking status: Never Smoker  . Smokeless tobacco: Never Used  Substance and Sexual Activity  . Alcohol use: No  . Drug use: No  . Sexual activity: Not Currently  Other Topics Concern  . Not on file  Social History Narrative  . Not on file   Social Determinants of Health   Financial Resource Strain:   . Difficulty of Paying Living Expenses: Not on file  Food Insecurity:   . Worried About Charity fundraiser in the Last Year: Not on file  . Ran Out of Food in the Last Year: Not on file  Transportation Needs:   . Lack of Transportation (Medical): Not on file  . Lack of Transportation (Non-Medical): Not on file  Physical Activity:   . Days of Exercise per Week: Not on file  . Minutes of Exercise per Session: Not on file  Stress:   . Feeling of Stress : Not on file  Social Connections:   . Frequency of Communication with Friends and Family: Not on file  . Frequency of Social Gatherings with Friends and Family: Not on file  . Attends  Religious Services: Not on file  . Active Member of Clubs or Organizations: Not on file  . Attends Archivist Meetings: Not on file  . Marital Status: Not on file    Vitals:   06/24/19 1200  BP: 128/78  Pulse: 83  Resp: 16  SpO2: 97%   Wt Readings from Last 3 Encounters:  06/24/19 110 lb (49.9 kg)  08/06/18 110 lb (49.9 kg)  06/11/18 107 lb 8 oz (48.8 kg)   Body mass index is 20.78 kg/m.  Physical Exam  Nursing note and vitals reviewed. Constitutional: She is oriented to person, place, and time. She appears well-developed and well-nourished. No distress.  HENT:  Head: Normocephalic and atraumatic.  Mouth/Throat: Oropharynx is clear and moist. Mucous  membranes are dry. Abnormal dentition.  Eyes: Pupils are equal, round, and reactive to light. Conjunctivae are normal.  Cardiovascular: Normal rate and regular rhythm.  No murmur heard. Pulses:      Dorsalis pedis pulses are 2+ on the right side and 2+ on the left side.  Respiratory: Effort normal and breath sounds normal. No respiratory distress.  GI: Soft. She exhibits no mass. There is no abdominal tenderness.  Musculoskeletal:        General: No edema.  Lymphadenopathy:    She has no cervical adenopathy.  Neurological: She is alert and oriented to person, place, and time. She has normal strength.  Unstable gait,not assisted.  Skin: Skin is warm. No rash noted. No erythema.  Psychiatric: She has a normal mood and affect.  Well groomed, good eye contact.    ASSESSMENT AND PLAN:  [name] was seen today for nausea and chronic disease management.  Orders Placed This Encounter  Procedures  . US Abdomen Limited RUQ  . CBC  . Comprehensive metabolic panel  . Microalbumin / creatinine urine ratio  . VITAMIN D 25 Hydroxy (Vit-D Deficiency, Fractures)  . TSH  . Potassium  . Ambulatory referral to Cardiology  . EKG 12-Lead   Lab Results  Component Value Date   ALT 8 06/24/2019   AST 18  06/24/2019   ALKPHOS 116 06/24/2019   BILITOT 0.7 06/24/2019   Lab Results  Component Value Date   CREATININE 1.05 06/24/2019   BUN 21 06/24/2019   NA 143 06/24/2019   K 3.0 (L) 06/24/2019   CL 101 06/24/2019   CO2 33 (H) 06/24/2019   Lab Results  Component Value Date   TSH 1.70 06/24/2019   Lab Results  Component Value Date   WBC 9.5 06/24/2019   HGB 13.7 06/24/2019   HCT 40.5 06/24/2019   MCV 96.2 06/24/2019   PLT 288.0 06/24/2019    Heart palpitations This has been a chronic problem but getting worse. EKG today SR,PVC's,, LAD. When compared with EKG 07/2018 PVC's are more frequent, ventricular bigeminy. Clearly instructed about warning signs.  Cellulitis of trunk, unspecified site of trunk Overlap infection on areas under breast with intertrigo,mild. Recommend avoiding scratching. Abx side effects discussed.  - cephALEXin (KEFLEX) 500 MG capsule; Take 1 capsule (500 mg total) by mouth 2 (two) times daily for 7 days.  Dispense: 14 capsule; Refill: 0  HTN (hypertension) BP adequately controlled. Continue amlodipine 2.5 mg daily and HCTZ 25 mg daily. Continue low salt diet. Try to monitor BP regularly at home.   Hypokalemia HCTZ 25 mg helps with lower extremity edema, we had discussed side effects. Continue K-Lor 20 mEq daily. Further recommendation will be given according to K+ result.  CKD (chronic kidney disease), stage III (HCC) Problem has been stable. Continue low-salt diet. Adequate hydration and BP control. Avoid NSAIDs. Further recommendation will be given according to lab results.  Intertrigo We discussed diagnosis, prognosis, and treatment options. Nystatin powder and Lotrisone cream helped in the past, so resume them. Recommend keeping area clean with soap and water and to use a bra.  Vitamin D deficiency, unspecified Continue vitamin D supplementation 5000 units daily. Dose will be adjusted according to 25 [place] vitamin D results.  Nausea  without vomiting This problem seems to be chronic. We discussed possible etiologies, including cholelithiasis even though she is not reporting symptoms after food intake. ?  Dyspepsia/GERD. RUQ [place] will be arranged. She was clearly instructed about warning signs.  Systolic heart failure, unspecified HF chronicity (HCC) Asymptomatic. Continue low-salt diet. Cardiology referral placed.   Return in about 3 months (around 09/24/2019) for Nausea.   Temesha Queener G. [place], MD  [facility] [place] office.

## 2019-06-24 NOTE — Assessment & Plan Note (Signed)
BP adequately controlled. Continue amlodipine 2.5 mg daily and HCTZ 25 mg daily. Continue low salt diet. Try to monitor BP regularly at home.

## 2019-06-24 NOTE — Assessment & Plan Note (Signed)
Problem has been stable. Continue low-salt diet. Adequate hydration and BP control. Avoid NSAIDs. Further recommendation will be given according to lab results.

## 2019-06-24 NOTE — Assessment & Plan Note (Signed)
Continue vitamin D supplementation 5000 units daily. Dose will be adjusted according to 25 [place] vitamin D results.

## 2019-06-24 NOTE — Patient Instructions (Addendum)
A few things to remember from today's visit:   Systolic heart failure, unspecified HF chronicity (HCC)  Essential hypertension - Plan: Comprehensive metabolic panel  Stage 3a chronic kidney disease - Plan: Microalbumin / creatinine urine ratio  Heart palpitations - Plan: EKG 12-Lead, Comprehensive metabolic panel, TSH, Ambulatory referral to Cardiology  Nausea without vomiting - Plan: US Abdomen Limited RUQ, CBC, Comprehensive metabolic panel  Hypokalemia - Plan: Comprehensive metabolic panel  Vitamin D deficiency, unspecified - Plan: VITAMIN D 25 Hydroxy (Vit-D Deficiency, Fractures)  Intertrigo - Plan: clotrimazole-betamethasone (LOTRISONE) cream, nystatin (MYCOSTATIN/NYSTOP) powder  Cellulitis of trunk, unspecified site of trunk - Plan: cephALEXin (KEFLEX) 500 MG capsule  Omeprazole 40 mg to take 30-minute before breakfast. Try to keep the areas under your breast dry. Antibiotic was recommended. Keep area clean with soap and water.  No changes in the rest of your medications.  Please be sure medication list is accurate. If a new problem present, please set up appointment sooner than planned today.

## 2019-06-24 NOTE — Assessment & Plan Note (Signed)
Asymptomatic. Continue low-salt diet. Cardiology referral placed.

## 2019-06-24 NOTE — Assessment & Plan Note (Signed)
HCTZ 25 mg helps with lower extremity edema, we had discussed side effects. Continue K-Lor 20 mEq daily. Further recommendation will be given according to K+ result.

## 2019-07-11 ENCOUNTER — Other Ambulatory Visit: Payer: Self-pay

## 2019-07-11 ENCOUNTER — Encounter: Payer: Self-pay | Admitting: Cardiology

## 2019-07-11 ENCOUNTER — Telehealth: Payer: Self-pay | Admitting: Radiology

## 2019-07-11 ENCOUNTER — Ambulatory Visit (INDEPENDENT_AMBULATORY_CARE_PROVIDER_SITE_OTHER): Payer: Medicare Other | Admitting: Cardiology

## 2019-07-11 VITALS — BP 122/80 | HR 96 | Ht 61.0 in | Wt 113.0 lb

## 2019-07-11 DIAGNOSIS — I10 Essential (primary) hypertension: Secondary | ICD-10-CM

## 2019-07-11 DIAGNOSIS — I5181 Takotsubo syndrome: Secondary | ICD-10-CM | POA: Diagnosis not present

## 2019-07-11 DIAGNOSIS — E785 Hyperlipidemia, unspecified: Secondary | ICD-10-CM

## 2019-07-11 DIAGNOSIS — I493 Ventricular premature depolarization: Secondary | ICD-10-CM | POA: Diagnosis not present

## 2019-07-11 DIAGNOSIS — I5020 Unspecified systolic (congestive) heart failure: Secondary | ICD-10-CM | POA: Diagnosis not present

## 2019-07-11 DIAGNOSIS — R600 Localized edema: Secondary | ICD-10-CM

## 2019-07-11 DIAGNOSIS — E876 Hypokalemia: Secondary | ICD-10-CM

## 2019-07-11 NOTE — Progress Notes (Signed)
Primary Care Provider: Martinique, Betty G, MD Cardiologist: No primary care provider on file. Electrophysiologist: None  Clinic Note: Chief Complaint  Patient presents with  . Follow-up    Referred back by PCP because of PVCs    HPI:    Colleen Davis is a 74 y.o. female who is being seen today for the evaluation of PVCs on EKG with complaint of PALPITATIONS at the request of [place]ique, Betty G, MD.  Colleen Davis was last seen on October 07, 2016 for preop evaluation.  She had a Myoview stress test that was negative for ischemia in May 2018.  --> Plan was for laparoscopic cholecystectomy that was delayed until she was able to gain the strength.  During her hospitalization for acute cholecystitis she was found to have severely reduced EF of 30 to 35% that was likely related to Takotsubo.  --> EF was back to normal by Myoview several weeks later.  Recent Hospitalizations: none  Recent visit with Dr. Martinique (PCP) -> she noticed some "skipping beats ".  No chest pain or shortness of breath.  EKG showed PVCs. _>  Referred back for evaluation.  Reviewed  CV studies:    The following studies were reviewed today: (if available, images/films reviewed: From Epic Chart or Care Everywhere) . No new studies:   Interval History:   Colleen Davis presents here today for follow-up evaluation likely to reevaluate for palpitations.  What she describes is palpitations and increased heart rate with most notably when she is trying to lay down at night or relaxes into the day.  Interestingly, at the time of her PCP visit, she was also noted to be hypokalemic.  She was given potassium supplement, and HCTZ was discontinued.  She does note that the palpitations seem to have improved since that.  She says that these palpitations are not associated with any sense of rapid heart rate or prolonged episodes.  Relatively short-lived and not overly symptomatic from a standpoint of dyspnea or dizziness.  She just  has an unusual sensation in her chest.  Not described as chest pain.  No associated dyspnea, or syncope/near syncope.  She sleeps with her head up mostly because of vertigo.  Whenever she lies down flat, she has vertigo symptoms.  Has not really described PND orthopnea.  She is not very active, limited mostly because of back pain that limits her walking.  CV Review of Symptoms (Summary) Cardiovascular ROS: no chest pain or dyspnea on exertion positive for - irregular heartbeat, palpitations and Positional vertigo negative for - edema, orthopnea, paroxysmal nocturnal dyspnea, rapid heart rate, shortness of breath or Syncope/near syncope, TIA/amaurosis fugax, claudication  The patient does not have symptoms concerning for COVID-19 infection (fever, chills, cough, or new shortness of breath).  The patient is practicing social distancing & Masking.    REVIEWED OF SYSTEMS   Review of Systems  Constitutional: Negative for malaise/fatigue and weight loss.  HENT: Negative for congestion and nosebleeds.   Respiratory: Negative for shortness of breath and wheezing.   Cardiovascular: Negative for leg swelling (Trivial.  Not worrisome).  Musculoskeletal: Positive for back pain and joint pain (Hips, knees ).  Neurological: Positive for dizziness (Significant vertigo.  Has to sleep with head of bed up.). Negative for focal weakness, weakness and headaches.  Psychiatric/Behavioral: Negative for memory loss. The patient is not nervous/anxious and does not have insomnia.    I have reviewed and (if needed) personally updated the patient's problem list, medications, allergies,  past medical and surgical history, social and family history.   PAST MEDICAL HISTORY   Past Medical History:  Diagnosis Date  . Anxiety   . Cardiomyopathy (HCC) 08/2016   EF 35% with suggestion of anterior wall motion abnormality. Cannot exclude ischemic versus Takotsubo  . Chicken pox   . Chronic kidney disease    polycystic    . Dehydration    recently hosp  . Depression   . History of kidney stones    cyst on kidney  . Hypertension   . Hypokalemia     PAST SURGICAL HISTORY   Past Surgical History:  Procedure Laterality Date  . DILATION AND CURETTAGE OF UTERUS    . NM MYOVIEW LTD  09/14/2016   Normal EF 55-65%. Normal wall motion. No evidence of ischemia or infarction.  . TRANSTHORACIC ECHOCARDIOGRAM  08/18/2016   ** in setting of sepsis/cholecystitis: EF 30-35% with severe hypokinesis of the distal septal, distal inferior, mid/distal anterior, mid/distal anterolateral walls and Akinesis of the apex. The cavity size was normal. Wall thickness was normal. Trivial Aortic regurgitation. Mitral Mild MR  . TUBAL LIGATION      MEDICATIONS/ALLERGIES   Current Meds  Medication Sig  . amLODipine (NORVASC) 2.5 MG tablet TAKE 1 TABLET BY MOUTH EVERY DAY  . aspirin EC 81 MG tablet Take 1 tablet (81 mg total) by mouth daily.  Marland Kitchen atorvastatin (LIPITOR) 20 MG tablet TAKE 1 TABLET (20 MG TOTAL) BY MOUTH DAILY AT 6 PM.  . Cholecalciferol (VITAMIN D3) 125 MCG (5000 UT) CAPS Take by mouth daily.  . clotrimazole-betamethasone (LOTRISONE) cream Apply 1 application topically 2 (two) times daily.  Marland Kitchen nystatin (MYCOSTATIN/NYSTOP) powder Apply topically 3 (three) times daily.  Marland Kitchen omeprazole (PRILOSEC) 40 MG capsule Take 1 capsule (40 mg total) by mouth daily.  . potassium chloride SA (KLOR-CON M20) 20 MEQ tablet TAKE 1 TABLET TWICE DAILY FOR 7 DAYS THEN CONTINUE 1 TABLET DAILY.  Marland Kitchen thiamine (VITAMIN B-1) 100 MG tablet Take 1 tablet (100 mg total) by mouth daily.    Allergies  Allergen Reactions  . No Known Allergies     SOCIAL HISTORY/FAMILY HISTORY   Reviewed in Epic:  Pertinent findings: N/A  OBJCTIVE -PE, EKG, labs   Wt Readings from Last 3 Encounters:  07/11/19 113 lb (51.3 kg)  06/24/19 110 lb (49.9 kg)  08/06/18 110 lb (49.9 kg)    Physical Exam: BP 122/80   Pulse 96   Ht 5\' 1"  (1.549 m)   Wt 113 lb  (51.3 kg)   SpO2 95%   BMI 21.35 kg/m  Physical Exam  Constitutional: She is oriented to person, place, and time. She appears well-developed and well-nourished. No distress.  Healthy-appearing.  Well-groomed  HENT:  Head: Normocephalic and atraumatic.  Neck: Normal carotid pulses and no JVD present. Carotid bruit is not present.  Cardiovascular: Normal rate, regular rhythm, normal heart sounds and intact distal pulses.  Occasional extrasystoles are present. PMI is not displaced. Exam reveals no gallop and no friction rub.  No murmur heard. Pulmonary/Chest: Effort normal and breath sounds normal. No respiratory distress. She has no wheezes. She has no rales.  Musculoskeletal:        General: Edema (Trivial bilateral LE) present. Normal range of motion.     Cervical back: Normal range of motion and neck supple.  Neurological: She is alert and oriented to person, place, and time.  Psychiatric: She has a normal mood and affect. Her behavior is normal. Judgment and thought  content normal.  Vitals reviewed.    Adult ECG Report  Rate: 96;  Rhythm: normal sinus rhythm and Rightward axis with nonspecific ST and T wave changes.  Otherwise normal;   Narrative Interpretation: Stable EKG.  EKG from PCP June 25, 2019: (personally reviewed.  Indicated sinus rhythm, rate 80 bpm.  PVCs in bigeminy pattern.  But otherwise normal axis, intervals and durations.  Recent Labs:    Lab Results  Component Value Date   CHOL 156 01/17/2017   HDL 53.30 01/17/2017   LDLCALC 88 01/17/2017   TRIG 74.0 01/17/2017   CHOLHDL 3 01/17/2017   Lab Results  Component Value Date   CREATININE 1.05 06/24/2019   BUN 21 06/24/2019   NA 143 06/24/2019   K 3.0 (L) 06/24/2019   CL 101 06/24/2019   CO2 33 (H) 06/24/2019   Lab Results  Component Value Date   TSH 1.70 06/24/2019    ASSESSMENT/PLAN    Problem List Items Addressed This Visit    Hyperlipidemia with target LDL less than 100 (Chronic)    LDL target  may not be <70% with normal Myoview, but would still probably prefer LDL less than 100.  Had not been checked since 2018. Plan: Recommend that we add lipid panel to plan labs checked by PCP.      Takotsubo cardiomyopathy - resolved (Chronic)    EF improved by evaluation on Myoview, but has not had a follow-up echocardiogram.  Now in light of PVCs noted on EKG, we will recheck echocardiogram to confirm.      Relevant Orders   EKG 12-Lead   ECHOCARDIOGRAM COMPLETE   Essential hypertension (Chronic)    Blood pressure adequately controlled.  Is on low-dose amlodipine.  Pending evaluation with echo and monitor, may want to consider addition of beta-blocker for PVC control.  As it stands, she is not overly symptomatic, and I would be reluctant to treat unless significant PVC burden noted.      Hypokalemia    Presumably related to HCTZ. Potassium now back to normal.  On potassium supplementation.  Wonder if this could have been potential etiology for her PVCs.      Bilateral lower extremity edema    No longer on HCTZ because of hypokalemia.  Continue potassium supplementation and avoid diuretic for now.  Would err on the side of support stockings and foot elevation.      Systolic heart failure, unspecified HF chronicity (HCC)    EF improved on Myoview.  Will confirm with echo now, but I would not say that she has systolic heart failure.  Clearly may have some diastolic component was of diastolic dysfunction, but likely has resolved cardiomyopathy.  If echocardiogram shows improved EF, we will delete this from problem list.      Relevant Orders   EKG 12-Lead   ECHOCARDIOGRAM COMPLETE   PVC's (premature ventricular contractions) - Primary    She did have ventricular bigeminy on EKG at PCPs office.  No longer present here today.  But she is noticing palpitations.  I suspect what she is feeling is these PVCs.  Need to determine PVC burden.  Also need to ensure that her EF is  stabilized.  Plan: 14-day Zio patch and 2D echocardiogram.  Hold off on beta-blocker for now.      Relevant Orders   EKG 12-Lead   ECHOCARDIOGRAM COMPLETE   LONG TERM MONITOR (3-14 DAYS)     COVID-19 Education: The signs and symptoms of COVID-19 were discussed with  the patient and how to seek care for testing (follow up with PCP or arrange E-visit).   The importance of social distancing was discussed today.  I spent a total of with the patient. >  50% of the time was spent in direct patient consultation.  Additional time spent with chart review  / charting (studies, outside notes, etc): 8 Total Time: 29 min   Current medicines are reviewed at length with the patient today.  (+/- concerns) N/A  Notice: This dictation was prepared with Dragon dictation along with smaller phrase technology. Any transcriptional errors that result from this process are unintentional and may not be corrected upon review.  Patient Instructions / Medication Changes & Studies & Tests Ordered   Patient Instructions  Medication Instructions:  No changes  *If you need a refill on your cardiac medications before your next appointment, please call your pharmacy*   Lab Work: Not needed   Testing/Procedures:  Will be schedule at [facility] [address] Your physician has requested that you have an echocardiogram. Echocardiography is a painless test that uses sound waves to create images of your heart. It provides your doctor with information about the size and shape of your heart and how well your heart's chambers and valves are working. This procedure takes approximately one hour. There are no restrictions for this procedure.  AND  Your physician has recommended that you wear a 14  DAY ZIO-PATCH monitor. The Zio patch cardiac monitor continuously records heart rhythm data for up to 14 days, this is for patients being evaluated for multiple types heart rhythms. For the first 24  hours post application, please avoid getting the Zio monitor wet in the shower or by excessive sweating during exercise. After that, feel free to carry on with regular activities. Keep soaps and lotions away from the ZIO XT Patch.  This will be mailed to you, please expect 7-10 days to receive.  [place] location - [address], [address].       Follow-Up: At [facility], you and your health needs are our priority.  As part of our continuing mission to provide you with exceptional heart care, we have created designated Provider Care Teams.  These Care Teams include your primary Cardiologist (physician) and Advanced Practice Providers (APPs -  Physician Assistants and Nurse Practitioners) who all work together to provide you with the care you need, when you need it.  Your next appointment:   2 month(s)  The format for your next appointment:   In Person  Provider:   Bryan Lemma, MD  .Christena Deem- Long Term Monitor Instructions   Your physician has requested you wear your ZIO patch monitor__14_____days.   Studies Ordered:   Orders Placed This Encounter  Procedures  . LONG TERM MONITOR (3-14 DAYS)  . EKG 12-Lead  . ECHOCARDIOGRAM COMPLETE     Bryan Lemma, M.D., M.S. Interventional Cardiologist   Pager # [phone] Phone # [phone] [address] [place], [place] [address]   Thank you for choosing Heartcare at [facility]!!

## 2019-07-11 NOTE — Patient Instructions (Addendum)
Medication Instructions:  No changes  *If you need a refill on your cardiac medications before your next appointment, please call your pharmacy*   Lab Work: Not needed   Testing/Procedures:  Will be schedule at [place] has requested that you have an echocardiogram. Echocardiography is a painless test that uses sound waves to create images of your heart. It provides your doctor with information about the size and shape of your heart and how well your heart's chambers and valves are working. This procedure takes approximately one hour. There are no restrictions for this procedure.  AND  Your physician has recommended that you wear a 14  DAY ZIO-PATCH monitor. The Zio patch cardiac monitor continuously records heart rhythm data for up to 14 days, this is for patients being evaluated for multiple types heart rhythms. For the first 24 hours post application, please avoid getting the Zio monitor wet in the shower or by excessive sweating during exercise. After that, feel free to carry on with regular activities. Keep soaps and lotions away from the ZIO XT Patch.  This will be mailed to you, please expect 7-10 days to receive.  [facility] location - [place], [address].        Follow-Up: At [facility], you and your health needs are our priority.  As part of our continuing mission to provide you with exceptional heart care, we have created designated Provider Care Teams.  These Care Teams include your primary Cardiologist (physician) and Advanced Practice Providers (APPs -  Physician Assistants and Nurse Practitioners) who all work together to provide you with the care you need, when you need it.    Your next appointment:   2 month(s)  The format for your next appointment:   In Person  Provider:   Glenetta Hew, MD     .[place] Monitor Instructions   Your physician has requested you wear your ZIO patch  monitor__14_____days.   This is a single patch monitor.  [employer] supplies one patch monitor per enrollment.  Additional stickers are not available.   Please do not apply patch if you will be having a Nuclear Stress Test, Echocardiogram, Cardiac CT, MRI, or Chest Xray during the time frame you would be wearing the monitor. The patch cannot be worn during these tests.  You cannot remove and re-apply the ZIO XT patch monitor.   Your ZIO patch monitor will be sent USPS Priority mail from [facility] directly to your home address. The monitor may also be mailed to a PO BOX if home delivery is not available.   It may take 3-5 days to receive your monitor after you have been enrolled.   Once you have received you monitor, please review enclosed instructions.  Your monitor has already been registered assigning a specific monitor serial # to you.   Applying the monitor   Shave hair from upper left chest.   Hold abrader disc by orange tab.  Rub abrader in 40 strokes over left upper chest as indicated in your monitor instructions.   Clean area with 4 enclosed alcohol pads .  Use all pads to assure are is cleaned thoroughly.  Let dry.   Apply patch as indicated in monitor instructions.  Patch will be place under collarbone on left side of chest with arrow pointing upward.   Rub patch adhesive wings for 2 minutes.Remove white label marked "1".  Remove white label marked "2".  Rub  patch adhesive wings for 2 additional minutes.   While looking in a mirror, press and release button in center of patch.  A small green light will flash 3-4 times .  This will be your only indicator the monitor has been turned on.     Do not shower for the first 24 hours.  You may shower after the first 24 hours.   Press button if you feel a symptom. You will hear a small click.  Record Date, Time and Symptom in the Patient Log Book.   When you are ready to remove patch, follow instructions on last 2 pages of Patient  Log Book.  Stick patch monitor onto last page of Patient Log Book.   Place Patient Log Book in [place] box.  Use locking tab on box and tape box closed securely.  The [employer] has [employer] on it.  Please place in mailbox as soon as possible.  Your physician should have your test results approximately 7 days after the monitor has been mailed back to [facility]   Call [facility] Customer Care at [phone] if you have questions regarding your ZIO XT patch monitor.  Call them immediately if you see an orange light blinking on your monitor.   If your monitor falls off in less than 4 days contact our Monitor department at [phone].  If your monitor becomes loose or falls off after 4 days call [employer] at [phone] for suggestions on securing your monitor.

## 2019-07-11 NOTE — Telephone Encounter (Signed)
Enrolled patient for a 14 day Zio monitor to be mailed to patients home.  

## 2019-07-14 ENCOUNTER — Encounter: Payer: Self-pay | Admitting: Cardiology

## 2019-07-14 NOTE — Assessment & Plan Note (Signed)
No longer on HCTZ because of hypokalemia.  Continue potassium supplementation and avoid diuretic for now.  Would err on the side of support stockings and foot elevation.

## 2019-07-14 NOTE — Assessment & Plan Note (Signed)
EF improved on Myoview.  Will confirm with echo now, but I would not say that she has systolic heart failure.  Clearly may have some diastolic component was of diastolic dysfunction, but likely has resolved cardiomyopathy.  If echocardiogram shows improved EF, we will delete this from problem list.

## 2019-07-14 NOTE — Assessment & Plan Note (Signed)
She did have ventricular bigeminy on EKG at PCPs office.  No longer present here today.  But she is noticing palpitations.  I suspect what she is feeling is these PVCs.  Need to determine PVC burden.  Also need to ensure that her EF is stabilized.  Plan: 14-day Zio patch and 2D echocardiogram.  Hold off on beta-blocker for now.

## 2019-07-14 NOTE — Assessment & Plan Note (Signed)
LDL target may not be <70% with normal Myoview, but would still probably prefer LDL less than 100.  Had not been checked since 2018. Plan: Recommend that we add lipid panel to plan labs checked by PCP.

## 2019-07-14 NOTE — Assessment & Plan Note (Signed)
Presumably related to HCTZ. Potassium now back to normal.  On potassium supplementation.  Wonder if this could have been potential etiology for her PVCs.

## 2019-07-14 NOTE — Assessment & Plan Note (Signed)
EF improved by evaluation on Myoview, but has not had a follow-up echocardiogram.  Now in light of PVCs noted on EKG, we will recheck echocardiogram to confirm.

## 2019-07-14 NOTE — Assessment & Plan Note (Signed)
Blood pressure adequately controlled.  Is on low-dose amlodipine.  Pending evaluation with echo and monitor, may want to consider addition of beta-blocker for PVC control.  As it stands, she is not overly symptomatic, and I would be reluctant to treat unless significant PVC burden noted.

## 2019-07-16 ENCOUNTER — Other Ambulatory Visit (INDEPENDENT_AMBULATORY_CARE_PROVIDER_SITE_OTHER): Payer: Medicare Other

## 2019-07-16 ENCOUNTER — Other Ambulatory Visit: Payer: Self-pay

## 2019-07-16 DIAGNOSIS — E876 Hypokalemia: Secondary | ICD-10-CM | POA: Diagnosis not present

## 2019-07-17 ENCOUNTER — Other Ambulatory Visit: Payer: Self-pay | Admitting: Family Medicine

## 2019-07-17 LAB — POTASSIUM: Potassium: 3.9 mEq/L (ref 3.5–5.1)

## 2019-07-18 ENCOUNTER — Other Ambulatory Visit: Payer: Self-pay | Admitting: Family Medicine

## 2019-07-18 DIAGNOSIS — E876 Hypokalemia: Secondary | ICD-10-CM

## 2019-07-24 ENCOUNTER — Other Ambulatory Visit (HOSPITAL_COMMUNITY): Payer: Medicare Other

## 2019-08-08 ENCOUNTER — Ambulatory Visit (HOSPITAL_COMMUNITY): Payer: Medicare Other | Attending: Cardiovascular Disease

## 2019-08-08 ENCOUNTER — Other Ambulatory Visit: Payer: Self-pay

## 2019-08-08 DIAGNOSIS — I5181 Takotsubo syndrome: Secondary | ICD-10-CM | POA: Insufficient documentation

## 2019-08-08 DIAGNOSIS — I493 Ventricular premature depolarization: Secondary | ICD-10-CM | POA: Insufficient documentation

## 2019-08-08 DIAGNOSIS — I5020 Unspecified systolic (congestive) heart failure: Secondary | ICD-10-CM | POA: Diagnosis not present

## 2019-08-15 ENCOUNTER — Other Ambulatory Visit: Payer: Self-pay | Admitting: Family Medicine

## 2019-08-16 ENCOUNTER — Other Ambulatory Visit: Payer: Self-pay | Admitting: Family Medicine

## 2019-08-16 DIAGNOSIS — L304 Erythema intertrigo: Secondary | ICD-10-CM

## 2019-09-10 ENCOUNTER — Ambulatory Visit: Payer: Medicare Other | Admitting: Cardiology

## 2019-09-13 ENCOUNTER — Other Ambulatory Visit: Payer: Self-pay | Admitting: Family Medicine

## 2019-10-01 ENCOUNTER — Other Ambulatory Visit: Payer: Self-pay | Admitting: *Deleted

## 2019-10-01 MED ORDER — OMEPRAZOLE 40 MG PO CPDR: DELAYED_RELEASE_CAPSULE | ORAL | 1 refills | Status: DC

## 2019-10-02 ENCOUNTER — Other Ambulatory Visit: Payer: Self-pay | Admitting: *Deleted

## 2019-10-02 DIAGNOSIS — E876 Hypokalemia: Secondary | ICD-10-CM

## 2019-10-02 MED ORDER — POTASSIUM CHLORIDE CRYS ER 20 MEQ PO TBCR: EXTENDED_RELEASE_TABLET | ORAL | 0 refills | Status: DC

## 2019-10-30 ENCOUNTER — Telehealth: Payer: Self-pay | Admitting: Family Medicine

## 2019-10-30 NOTE — Telephone Encounter (Signed)
Left message for patient to schedule Annual Wellness Visit.  Please schedule with Nurse Health Advisor Shannon Crews, RN at [facility]  

## 2019-11-02 ENCOUNTER — Other Ambulatory Visit: Payer: Self-pay | Admitting: Family Medicine

## 2019-11-02 DIAGNOSIS — I10 Essential (primary) hypertension: Secondary | ICD-10-CM

## 2019-11-02 DIAGNOSIS — L304 Erythema intertrigo: Secondary | ICD-10-CM

## 2019-12-24 ENCOUNTER — Ambulatory Visit: Payer: Medicare Other | Admitting: Family Medicine

## 2019-12-25 ENCOUNTER — Telehealth: Payer: Self-pay | Admitting: Family Medicine

## 2019-12-25 NOTE — Progress Notes (Signed)
  Chronic Care Management   Outreach Note  12/25/2019 Name: Colleen Davis MRN: [id number] DOB: [birth year]  Referred by: [place], Betty G, MD Reason for referral : No chief complaint on file.   An unsuccessful telephone outreach was attempted today. The patient was referred to the pharmacist for assistance with care management and care coordination.   Follow Up Plan:   Carley Perdue [employer] Scheduler

## 2019-12-27 ENCOUNTER — Other Ambulatory Visit: Payer: Self-pay | Admitting: Family Medicine

## 2019-12-27 DIAGNOSIS — E876 Hypokalemia: Secondary | ICD-10-CM

## 2019-12-31 ENCOUNTER — Encounter: Payer: Self-pay | Admitting: Family Medicine

## 2019-12-31 ENCOUNTER — Ambulatory Visit (INDEPENDENT_AMBULATORY_CARE_PROVIDER_SITE_OTHER): Payer: Medicare Other | Admitting: Family Medicine

## 2019-12-31 ENCOUNTER — Other Ambulatory Visit: Payer: Self-pay

## 2019-12-31 VITALS — BP 120/76 | HR 92 | Temp 97.9°F | Resp 16 | Ht 61.0 in | Wt 118.0 lb

## 2019-12-31 DIAGNOSIS — R110 Nausea: Secondary | ICD-10-CM | POA: Diagnosis not present

## 2019-12-31 DIAGNOSIS — N1831 Chronic kidney disease, stage 3a: Secondary | ICD-10-CM

## 2019-12-31 DIAGNOSIS — E876 Hypokalemia: Secondary | ICD-10-CM

## 2019-12-31 DIAGNOSIS — I10 Essential (primary) hypertension: Secondary | ICD-10-CM

## 2019-12-31 MED ORDER — ONDANSETRON 4 MG PO TBDP: 4.0000 mg | ORAL_TABLET | Freq: Every day | ORAL | 0 refills | Status: DC | PRN

## 2019-12-31 NOTE — Progress Notes (Signed)
Chief Complaint  Patient presents with  . Nausea    in the mornings, about a week.   HPI: [name] is a 74 y.o. female, who is here today with her sister with above complain.  This is a chronic problem, she has had symptoms intermittently for a few years. Problem is worse when she first gets up, around 2 pm, she goes late to bed. Food intake seems to help, sometimes. K+ tab aggravates problem.  Negative for fever,chills,dysphagia,odynophagia,cough,or abnormal wt loss.  Takes OTC antiemetic medication.  Abdominal CT on 08/06/18:  Cholelithiasis. Mild gallbladder distention. Findings are similar to prior study. Numerous hepatic and bilateral renal cysts, stable. Small hiatal hernia. Esophagus is fluid-filled, likely related to reflux or dysmotility. Moderate stool burden in the colon. No acute findings in the abdomen or pelvis.  GERD: She is on Omeprazole 40 mg daily. Seldom heartburn. She can have up to 3-5 soft stools per day, not daily.  Usually there is no abdominal pain , had one episode a few months ago, resolved after defecation. No blood or mucus. Appetite is good.  HypoK+: She is on KLOR 20 meq daily. K+ 3.9 on 07/16/19.  HTN: She is on Amlodipine 2.5 mg daily. CKD III, Cr 1.2-1.3, e GFR has been in the high 50's.  Component     Latest Ref Rng & Units 06/24/2019          Sodium     135 - 146 mmol/L 143  Potassium     3.5 - 5.3 mmol/L 3.0 (L)  Chloride     98 - 110 mmol/L 101  CO2     20 - 32 mmol/L 33 (H)  Glucose     65 - 99 mg/dL 081 (H)  BUN     7 - 25 mg/dL 21  Creatinine     4.48 - 0.93 mg/dL 1.85  Total Bilirubin     0.2 - 1.2 mg/dL 0.7  Alkaline Phosphatase     39 - 117 U/L 116  AST     10 - 35 U/L 18  ALT     6 - 29 U/L 8  Total Protein     6.1 - 8.1 g/dL 6.8  Albumin     3.5 - 5.2 g/dL 4.2  Calcium     8.6 - 10.4 mg/dL 9.9  GFR     >63.14 mL/min 51.42 (L)   She has not noted gross hematuria, foam in urine,or  decreased urine output.  Negative for severe/frequent headache, visual changes, chest pain, dyspnea, palpitation, focal weakness, or edema.  Review of Systems  Constitutional: Positive for fatigue. Negative for activity change.  HENT: Negative for mouth sores and nosebleeds.   Endocrine: Negative for cold intolerance, heat intolerance, polydipsia, polyphagia and polyuria.  Genitourinary: Negative for difficulty urinating and dysuria.  Musculoskeletal: Positive for arthralgias and gait problem. Negative for myalgias.  Skin: Negative for color change and rash.  Neurological: Negative for syncope, facial asymmetry and weakness.  Psychiatric/Behavioral: Negative for confusion and hallucinations.  Rest see pertinent positives and negatives per HPI.  Current Outpatient Medications on File Prior to Visit  Medication Sig Dispense Refill  . amLODipine (NORVASC) 2.5 MG tablet TAKE 1 TABLET BY MOUTH EVERY DAY 90 tablet 1  . aspirin EC 81 MG tablet Take 1 tablet (81 mg total) by mouth daily. 30 tablet 0  . atorvastatin (LIPITOR) 20 MG tablet TAKE 1 TABLET (20 MG TOTAL) BY MOUTH DAILY AT 6 PM.  90 tablet 1  . Cholecalciferol (VITAMIN D3) 125 MCG (5000 UT) CAPS Take by mouth daily.    . clotrimazole-betamethasone (LOTRISONE) cream APPLY TOPICALLY 2 (TWO) TIMES DAILY AS NEEDED. 30 g 1  . nystatin (MYCOSTATIN/NYSTOP) powder Apply topically 3 (three) times daily. 60 g 3  . omeprazole (PRILOSEC) 40 MG capsule TAKE 1 CAPSULE BY MOUTH EVERY DAY 90 capsule 1  . potassium chloride SA (KLOR-CON M20) 20 MEQ tablet TAKE 1 TABLET TWICE DAILY FOR 7 DAYS THEN CONTINUE 1 TABLET DAILY. 90 tablet 0  . thiamine (VITAMIN B-1) 100 MG tablet Take 1 tablet (100 mg total) by mouth daily. 90 tablet 0   No current facility-administered medications on file prior to visit.   Past Medical History:  Diagnosis Date  . Anxiety   . Cardiomyopathy (HCC) 08/2016   EF 35% with suggestion of anterior wall motion abnormality. Cannot  exclude ischemic versus Takotsubo  . Chicken pox   . Chronic kidney disease    polycystic  . Dehydration    recently hosp  . Depression   . History of kidney stones    cyst on kidney  . Hypertension   . Hypokalemia    Allergies  Allergen Reactions  . No Known Allergies     Social History   Socioeconomic History  . Marital status: Single    Spouse name: Not on file  . Number of children: Not on file  . Years of education: Not on file  . Highest education level: Not on file  Occupational History  . Not on file  Tobacco Use  . Smoking status: Never Smoker  . Smokeless tobacco: Never Used  Vaping Use  . Vaping Use: Never used  Substance and Sexual Activity  . Alcohol use: No  . Drug use: No  . Sexual activity: Not Currently  Other Topics Concern  . Not on file  Social History Narrative  . Not on file   Social Determinants of Health   Financial Resource Strain:   . Difficulty of Paying Living Expenses: Not on file  Food Insecurity:   . Worried About Programme researcher, broadcasting/film/video in the Last Year: Not on file  . Ran Out of Food in the Last Year: Not on file  Transportation Needs:   . Lack of Transportation (Medical): Not on file  . Lack of Transportation (Non-Medical): Not on file  Physical Activity:   . Days of Exercise per Week: Not on file  . Minutes of Exercise per Session: Not on file  Stress:   . Feeling of Stress : Not on file  Social Connections:   . Frequency of Communication with Friends and Family: Not on file  . Frequency of Social Gatherings with Friends and Family: Not on file  . Attends Religious Services: Not on file  . Active Member of Clubs or Organizations: Not on file  . Attends Banker Meetings: Not on file  . Marital Status: Not on file    Vitals:   12/31/19 1436  BP: 120/76  Pulse: 92  Resp: 16  Temp: 97.9 F (36.6 C)  SpO2: 96%   Wt Readings from Last 3 Encounters:  12/31/19 118 lb (53.5 kg)  07/11/19 113 lb (51.3 kg)    06/24/19 110 lb (49.9 kg)   Body mass index is 22.3 kg/m.  Physical Exam Vitals and nursing note reviewed.  Constitutional:      General: She is not in acute distress.    Appearance: She is well-developed  and normal weight.  HENT:     Head: Normocephalic and atraumatic.     Mouth/Throat:     Mouth: Mucous membranes are moist.     Pharynx: Oropharynx is clear.  Eyes:     Conjunctiva/sclera: Conjunctivae normal.     Pupils: Pupils are equal, round, and reactive to light.  Cardiovascular:     Rate and Rhythm: Normal rate and regular rhythm.     Heart sounds: No murmur heard.      Comments: DP pulses present. Pulmonary:     Effort: Pulmonary effort is normal. No respiratory distress.     Breath sounds: Normal breath sounds.  Abdominal:     Palpations: Abdomen is soft. There is no hepatomegaly or mass.     Tenderness: There is no abdominal tenderness.  Lymphadenopathy:     Cervical: No cervical adenopathy.  Skin:    General: Skin is warm.     Findings: No erythema or rash.  Neurological:     Mental Status: She is alert and oriented to person, place, and time.     Cranial Nerves: No cranial nerve deficit.     Comments: Unstable gait, not assisted.  Psychiatric:        Mood and Affect: Mood and affect normal.     Comments: Good eye contact.   ASSESSMENT AND PLAN:  Colleen Davis was seen today for nausea.   Diagnoses and all orders for this visit: Orders Placed This Encounter  Procedures  . US Abdomen Limited RUQ  . COMPLETE METABOLIC PANEL WITH GFR   Lab Results  Component Value Date   ALT 7 12/31/2019   AST 14 12/31/2019   ALKPHOS 116 06/24/2019   BILITOT 0.8 12/31/2019   Lab Results  Component Value Date   CREATININE 1.34 (H) 12/31/2019   BUN 17 12/31/2019   NA 143 12/31/2019   K 4.4 12/31/2019   CL 104 12/31/2019   CO2 28 12/31/2019   Nausea without vomiting We discussed possible etiologies. ? Dyspepsia,gall bladder disease,GERD. Bland diet. Zofran  may help. Instructed about warning signs.  -     ondansetron (ZOFRAN ODT) 4 MG disintegrating tablet; Take 1 tablet (4 mg total) by mouth daily as needed for nausea or vomiting.  Stage 3a chronic kidney disease Adequate hydration and low salt diet to continue. Avoid NSAID's. Adequate BP control.  Essential hypertension BP adequately controlled. No changes in current management.  Hypokalemia No changes in current management, will follow BMP and will give further recommendations accordingly.   Return in about 4 months (around 05/01/2020) for Needs AWV, please eschedule with Shanonn..   Chinedum Vanhouten G. [place], MD  [facility]. [place] office.  A few things to remember from today's visit:   Stage 3a chronic kidney disease - Plan: COMPLETE METABOLIC PANEL WITH GFR  Essential hypertension - Plan: COMPLETE METABOLIC PANEL WITH GFR  Hypokalemia - Plan: COMPLETE METABOLIC PANEL WITH GFR  Nausea without vomiting - Plan: US Abdomen Limited RUQ  Zofran 4 mg daily as needed. Placed order for ultrasound.  No changes in Omeprazole.  If you need refills please call your pharmacy. Do not use My Chart to request refills or for acute issues that need immediate attention.    Please be sure medication list is accurate. If a new problem present, please set up appointment sooner than planned today.

## 2019-12-31 NOTE — Patient Instructions (Signed)
A few things to remember from today's visit:   Stage 3a chronic kidney disease - Plan: COMPLETE METABOLIC PANEL WITH GFR  Essential hypertension - Plan: COMPLETE METABOLIC PANEL WITH GFR  Hypokalemia - Plan: COMPLETE METABOLIC PANEL WITH GFR  Nausea without vomiting - Plan: US Abdomen Limited RUQ  Zofran 4 mg daily as needed. Placed order for ultrasound.  No changes in Omeprazole.  If you need refills please call your pharmacy. Do not use My Chart to request refills or for acute issues that need immediate attention.    Please be sure medication list is accurate. If a new problem present, please set up appointment sooner than planned today.

## 2020-01-01 ENCOUNTER — Telehealth: Payer: Self-pay | Admitting: Family Medicine

## 2020-01-01 LAB — COMPLETE METABOLIC PANEL WITH GFR
AG Ratio: 2 (calc) (ref 1.0–2.5)
ALT: 7 U/L (ref 6–29)
AST: 14 U/L (ref 10–35)
Albumin: 4.7 g/dL (ref 3.6–5.1)
Alkaline phosphatase (APISO): 111 U/L (ref 37–153)
BUN/Creatinine Ratio: 13 (calc) (ref 6–22)
BUN: 17 mg/dL (ref 7–25)
CO2: 28 mmol/L (ref 20–32)
Calcium: 10.3 mg/dL (ref 8.6–10.4)
Chloride: 104 mmol/L (ref 98–110)
Creat: 1.34 mg/dL — ABNORMAL HIGH (ref 0.60–0.93)
GFR, Est African American: 45 mL/min/{1.73_m2} — ABNORMAL LOW (ref >=60)
GFR, Est Non African American: 39 mL/min/{1.73_m2} — ABNORMAL LOW (ref >=60)
Globulin: 2.4 g/dL (calc) (ref 1.9–3.7)
Glucose, Bld: 93 mg/dL (ref 65–99)
Potassium: 4.4 mmol/L (ref 3.5–5.3)
Sodium: 143 mmol/L (ref 135–146)
Total Bilirubin: 0.8 mg/dL (ref 0.2–1.2)
Total Protein: 7.1 g/dL (ref 6.1–8.1)

## 2020-01-01 NOTE — Progress Notes (Signed)
°  Chronic Care Management   Outreach Note  01/01/2020 Name: Colleen Davis MRN: [id number] DOB: 05-27-1945  Referred by: [place], Betty G, MD Reason for referral : No chief complaint on file.   A second unsuccessful telephone outreach was attempted today. The patient was referred to pharmacist for assistance with care management and care coordination.  Follow Up Plan:   Carley Perdue [employer] Scheduler

## 2020-01-02 ENCOUNTER — Encounter: Payer: Self-pay | Admitting: Family Medicine

## 2020-01-07 ENCOUNTER — Telehealth: Payer: Self-pay | Admitting: *Deleted

## 2020-01-07 ENCOUNTER — Ambulatory Visit
Admission: RE | Admit: 2020-01-07 | Discharge: 2020-01-07 | Disposition: A | Discharge status: Home / Self Care | Payer: Medicare Other | Source: Ambulatory Visit | Attending: Family Medicine | Admitting: Family Medicine

## 2020-01-07 DIAGNOSIS — K8020 Calculus of gallbladder without cholecystitis without obstruction: Secondary | ICD-10-CM

## 2020-01-07 DIAGNOSIS — R112 Nausea with vomiting, unspecified: Secondary | ICD-10-CM | POA: Diagnosis not present

## 2020-01-07 DIAGNOSIS — R110 Nausea: Secondary | ICD-10-CM

## 2020-01-07 NOTE — Telephone Encounter (Signed)
Diane from [facility] called. Wanted to make sure we were able to see the [place] results from this am. Wants Dr.Jordan to review them ASAP

## 2020-01-07 NOTE — Telephone Encounter (Signed)
She is having persistent nausea, which could be caused by US findings,so I am recommending evaluation by general surgeon. Thanks, BJ

## 2020-01-07 NOTE — Telephone Encounter (Signed)
I spoke with patient's sister. We went over the results of the ultrasound. Referral to general surgery has been placed and they are aware that they will contact them to schedule the appointment.

## 2020-01-10 ENCOUNTER — Telehealth: Payer: Self-pay | Admitting: Family Medicine

## 2020-01-10 NOTE — Progress Notes (Signed)
°  Chronic Care Management   Outreach Note  01/10/2020 Name: Colleen Davis MRN: [id number] DOB: 1946/04/11  Referred by: [place], Betty G, MD Reason for referral : No chief complaint on file.   Third unsuccessful telephone outreach was attempted today. The patient was referred to the pharmacist for assistance with care management and care coordination.   Follow Up Plan:   Carley Perdue [employer] Scheduler

## 2020-03-02 ENCOUNTER — Ambulatory Visit: Payer: Self-pay | Admitting: Surgery

## 2020-03-02 ENCOUNTER — Telehealth: Payer: Self-pay

## 2020-03-02 DIAGNOSIS — R112 Nausea with vomiting, unspecified: Secondary | ICD-10-CM | POA: Diagnosis not present

## 2020-03-02 DIAGNOSIS — K8010 Calculus of gallbladder with chronic cholecystitis without obstruction: Secondary | ICD-10-CM | POA: Diagnosis not present

## 2020-03-02 NOTE — Telephone Encounter (Signed)
   Primary Cardiologist: Bryan Lemma, MD  Chart reviewed as part of pre-operative protocol coverage. Because of Colleen Davis's past medical history and time since last visit, she will require a follow-up visit in order to better assess preoperative cardiovascular risk.  Pre-op covering staff: - Please schedule appointment and call patient to inform them. If patient already had an upcoming appointment within acceptable timeframe, please add "pre-op clearance" to the appointment notes so provider is aware. - Please contact requesting surgeon's office via preferred method (i.e, phone, fax) to inform them of need for appointment prior to surgery.  If applicable, this message will also be routed to pharmacy pool and/or primary cardiologist for input on holding anticoagulant/antiplatelet agent as requested below so that this information is available to the clearing provider at time of patient's appointment.   [place], [place]  03/02/2020, 4:28 PM

## 2020-03-02 NOTE — H&P (Signed)
History of Present Illness Colleen Davis. Colleen Reller MD; 03/02/2020 11:04 AM) The patient is a 74 year old female who presents for evaluation of gall stones. Referred by Dr. Betty Davis for symptomatic gallstones GI - Dr. Evette Davis   This is a 74 year old female who presented in 2018 with a history of persistent nausea and vomiting as well as weight loss. She has had significant constipation. She denies any diarrhea. Her symptoms seem to be exacerbated by eating. She is able to keep down liquids. Zofran does not seem to be effective in controlling her nausea. She was evaluated in the emergency department on 3/30 and had a CT scan of the abdomen and pelvis showing gallstones but no sign of acute cholecystitis. She also had some mild submucosal fatty deposition within the cecum and ascending colon. The etiology of this was unclear. The patient has never had a colonoscopy despite a family history of colon cancer in her sister. The patient continues to be fairly nauseated and continues to lose weight.  She was scheduled for surgery but upon presentation for surgery, she was noted to be obtunded with significant pulmonary issues. Her surgery was canceled. However the patient never followed up to reschedule her surgery. Apparently she has been having more symptoms with persistent nausea. Recent ultrasound showed a 2 cm gallstone with some smaller surrounding gallstones and sludge. Liver function tests in September were normal.  CLINICAL DATA: Nausea and vomiting  EXAM: ULTRASOUND ABDOMEN LIMITED RIGHT UPPER QUADRANT  COMPARISON: CT abdomen and pelvis August 07, 2018  FINDINGS: Gallbladder:  Within the gallbladder, there are echogenic foci which move and shadow consistent with cholelithiasis. Largest gallstone measures 2.0 cm in length. There is mild sludge in the gallbladder. The gallbladder appears mildly distended. There is no appreciable gallbladder wall thickening or pericholecystic fluid.  No sonographic Murphy sign noted by sonographer.  Common bile duct:  Diameter: 4 mm. No intrahepatic or extrahepatic biliary duct dilatation.  Liver:  No focal lesion identified. Liver echogenicity overall is increased and mildly coarsened. Portal vein is patent on color Doppler imaging with normal direction of blood flow towards the liver.  Other: There are multiple renal cysts, better delineated on prior CT.  IMPRESSION: 1. Cholelithiasis and sludge in gallbladder. Gallbladder is mildly distended. Similar appearance compared to prior study. This finding potentially may warrant correlation with nuclear medicine hepatobiliary imaging study to assess for cystic duct patency.  2. Liver echogenicity is increased and somewhat coarsened, an appearance indicative of hepatic steatosis with questionable underlying parenchymal liver disease. No focal liver lesions evident. Note that the sensitivity of ultrasound for detection of focal liver lesions is somewhat diminished in this circumstance.  3. Renal cysts on the right, better seen on prior CT.  These results will be called to the ordering clinician or representative by the Radiologist Assistant, and communication documented in the PACS or [employer].   Electronically Signed By: Bretta Bang III M.D. On: 01/07/2020 13:33   Problem List/Past Medical Colleen Hazard K. Jasdeep Kepner, MD; 03/02/2020 11:04 AM) NAUSEA AND/OR VOMITING (R11.2) HYPOKALEMIA (E87.6) CHRONIC CHOLECYSTITIS WITH CALCULUS (K80.10)  Past Surgical History (Colleen Zellman K. Kloe Oates, MD; 03/02/2020 11:04 AM) No pertinent past surgical history  Diagnostic Studies History (Colleen Sato K. Manolo Bosket, MD; 03/02/2020 11:04 AM) Colonoscopy never Mammogram >3 years ago Pap Smear >5 years ago  Allergies Colleen Hazard K. Dalila Arca, MD; 03/02/2020 11:04 AM) No Known Drug Allergies [08/01/2016]:  Medication History (Colleen Davis; 03/02/2020 11:17 AM) Promethazine HCl  (12.5MG  Tablet, 1 (one) Tablet Oral four times  daily, as needed, Taken starting 08/01/2016) Active. Potassium Chloride ER ( Tablet ER, 2 (two) Tablet Oral daily, Taken starting 08/15/2016) Active. ALPRAZolam (0.25MG  Tablet, Oral) Active. Atorvastatin Calcium (20MG  Tablet, Oral) Active. Lisinopril (20MG  Tablet, Oral) Active. Zofran (4MG  Tablet, Oral) Active. Senna (8.6MG  Tablet, Oral) Active. Lipitor (20MG  Tablet, Oral) Active. Norvasc (5MG  Tablet, Oral) Active. Omeprazole (20MG  Tablet DR, Oral) Active. Aspirin (81MG  Tablet, Oral) Active. Carvedilol (12.5MG  Tablet, Oral) Active. Nystatin (100000 UNIT/GM Cream, External) Active. Medications Reconciled  Social History . Colleen Poythress, MD; 03/02/2020 11:04 AM) Caffeine use Carbonated beverages. No alcohol use No drug use Tobacco use Never smoker.  Family History . Colleen Murad, MD; 03/02/2020 11:04 AM) Cancer Father. Colon Cancer Sister. Hypertension Mother. Thyroid problems Mother.  Pregnancy / Birth History . Colleen Hopping, MD; 03/02/2020 11:04 AM) Age at menarche 16 years. Age of menopause 50-50 Gravida 23 Maternal age 8-25 Para 3  Other Problems Colleen Davis. Colleen Hafner, MD; 03/02/2020 11:04 AM) Anxiety Disorder Back Pain Cholelithiasis Gastroesophageal Reflux Disease High blood pressure Hypercholesterolemia    Vitals (Colleen Davis Davis; 03/02/2020 11:14 AM) 03/02/2020 11:14 AM Weight: 115.38 lb Height: 62in Body Surface Area: 1.51 m Body Mass Index: 21.1 kg/m  Temp.: 97.76F  Pulse: 102 (Regular)  P.OX: 97% (Room air) BP: 140/80(Sitting, Left Arm, Standard)        Physical Exam 50-56 K. Nicholl Onstott MD; 03/02/2020 11:24 AM)  The physical exam findings are as follows: Note:Constitutional: WDWN in NAD, conversant, no obvious deformities; resting comfortably Eyes: Pupils equal, round; sclera anicteric; moist conjunctiva; no lid lag HENT: Oral mucosa moist; good  dentition Neck: No masses palpated, trachea midline; no thyromegaly Lungs: CTA bilaterally; normal respiratory effort CV: Regular rate and rhythm; no murmurs; extremities well-perfused with no edema Abd: +bowel sounds, soft, non-tender, no palpable organomegaly; no palpable hernias Musc: Weak, slow gait; no apparent clubbing or cyanosis in extremities Lymphatic: No palpable cervical or axillary lymphadenopathy Skin: Warm, dry; no sign of jaundice Psychiatric - alert and oriented x 4; calm mood and affect    Assessment & Plan 23-34 K. Arden Tinoco MD; 03/02/2020 11:05 AM)  CHRONIC CHOLECYSTITIS WITH CALCULUS (K80.10)   NAUSEA AND/OR VOMITING (R11.2)  Current Plans Schedule for Surgery - Laparoscopic cholecystectomy with intraoperative cholangiogram. The surgical procedure has been discussed with the patient. Potential risks, benefits, alternative treatments, and expected outcomes have been explained. All of the patient's questions at this time have been answered. The likelihood of reaching the patient's treatment goal is good. The patient understand the proposed surgical procedure and wishes to proceed.  03/04/2020. 03/04/2020, MD, [facility]  General/ Trauma Surgery   03/02/2020 11:24 AM

## 2020-03-02 NOTE — Telephone Encounter (Signed)
Will send message to the [facility] office scheduler to assist in finding an appt for pre op.

## 2020-03-02 NOTE — Telephone Encounter (Signed)
   [facility] Pre-operative Risk Assessment    HEARTCARE STAFF: - Please ensure there is not already an duplicate clearance open for this procedure. - Under Visit Info/Reason for Call, type in Other and utilize the format Clearance MM/DD/YY or Clearance TBD. Do not use dashes or single digits. - If request is for dental extraction, please clarify the # of teeth to be extracted.  Request for surgical clearance:  1. What type of surgery is being performed? LAP CHOLE W/INTRAOPERA   2. When is this surgery scheduled? TBD   3. What type of clearance is required (medical clearance vs. Pharmacy clearance to hold med vs. Both)? BOTH  4. Are there any medications that need to be held prior to surgery and how long? ASA  5. Practice name and name of physician performing surgery? [facility] [place]   6. What is the office phone number? [phone]   7.   What is the office fax number? [phone]  8.   Anesthesia type (None, local, MAC, general) ? GENERAL   Waylan Rocher 03/02/2020, 3:57 PM  _________________________________________________________________   (provider comments below)

## 2020-03-02 NOTE — Telephone Encounter (Signed)
Pt has appt with Tereso Newcomer, [facility] 03/24/20 for pre op assessment. Pt aware she will be going to the [facility] office location. Will send notes to [facility] for upcoming appt. Will send FYI to requesting office pt has appt 12/7. Will remove from the pre op call back pool.

## 2020-03-13 ENCOUNTER — Other Ambulatory Visit: Payer: Self-pay | Admitting: Family Medicine

## 2020-03-13 DIAGNOSIS — E876 Hypokalemia: Secondary | ICD-10-CM

## 2020-03-15 ENCOUNTER — Other Ambulatory Visit: Payer: Self-pay | Admitting: Family Medicine

## 2020-03-15 DIAGNOSIS — R110 Nausea: Secondary | ICD-10-CM

## 2020-03-16 MED ORDER — POTASSIUM CHLORIDE CRYS ER 20 MEQ PO TBCR: EXTENDED_RELEASE_TABLET | ORAL | 0 refills | Status: DC

## 2020-03-16 MED ORDER — ONDANSETRON 4 MG PO TBDP: 4.0000 mg | ORAL_TABLET | Freq: Every day | ORAL | 0 refills | Status: DC | PRN

## 2020-03-24 ENCOUNTER — Ambulatory Visit (INDEPENDENT_AMBULATORY_CARE_PROVIDER_SITE_OTHER): Payer: Medicare Other | Admitting: Physician Assistant

## 2020-03-24 ENCOUNTER — Other Ambulatory Visit: Payer: Self-pay

## 2020-03-24 ENCOUNTER — Encounter: Payer: Self-pay | Admitting: Physician Assistant

## 2020-03-24 VITALS — BP 122/74 | HR 74 | Ht 61.0 in | Wt 110.4 lb

## 2020-03-24 DIAGNOSIS — I5181 Takotsubo syndrome: Secondary | ICD-10-CM

## 2020-03-24 DIAGNOSIS — I10 Essential (primary) hypertension: Secondary | ICD-10-CM

## 2020-03-24 DIAGNOSIS — Z01810 Encounter for preprocedural cardiovascular examination: Secondary | ICD-10-CM | POA: Diagnosis not present

## 2020-03-24 DIAGNOSIS — I4891 Unspecified atrial fibrillation: Secondary | ICD-10-CM | POA: Diagnosis not present

## 2020-03-24 DIAGNOSIS — I493 Ventricular premature depolarization: Secondary | ICD-10-CM | POA: Diagnosis not present

## 2020-03-24 MED ORDER — APIXABAN 5 MG PO TABS: 5.0000 mg | ORAL_TABLET | Freq: Two times a day (BID) | ORAL | 6 refills | Status: AC

## 2020-03-24 NOTE — Progress Notes (Signed)
Cardiology Office Note:    Date:  03/24/2020   ID:  Ellsworth Lennox, DOB March 28, 1946, MRN [id number]  PCP:  [place], Betty G, MD  [facility] Cardiologist:  Bryan Lemma, MD   [facility] Electrophysiologist:  None   Referring MD: [place], Betty G, MD   Chief Complaint:  Follow-up (surgical clearance)    Patient Profile:    Colleen Davis is a 74 y.o. female with:   Tako-Tsubo CM  Echo 5/18: EF 30-35  Myoview 5/18: EF 55, no ischemia  Echo 4/21: EF 55-60  Palpitations  PVCs  Myoview 5/18: neg for ischemia   Hypertension   Hyperlipidemia   Polycystic kidney disease   Aortic atherosclerosis (CT 3/18)  Prior CV studies: Echocardiogram 08/08/2019 EF 55-60, GLS -19.3, no RWMA, normal diastolic function, normal RVSF, mild MR, mild AI, mild aortic valve sclerosis (no AS), mild dilation of the aortic root (38 mm)  Myoview 09/14/2016 EF 55, no ischemia or infarction  Echocardiogram 08/18/2016 EF 30-35 with severe HK of the distal septal, distal inferior, mid/distal anterior, mid/distal anterolateral walls and apical AK, trivial AI, mild MR  Carotid US 11/17/2011 No significant ICA stenosis bilaterally  History of Present Illness:    Colleen Davis was last seen by Dr. Herbie Baltimore in March 2021 for the evaluation of PVCs.  Follow-up echocardiogram confirmed that her EF is normal.  14-day ZIO was ordered to quantify PVCs but was never performed.  She returns for surgical clearance.  She needs a laparoscopic cholecystectomy with Dr. Corliss Skains.    She is here today with her sister.  She has been weak for a long time.  She is fairly sedentary.  She has not had much of an appetite lately.  She has a lot of nausea with her GB disease.  She has not had chest pain, shortness of breath, syncope, orthopnea, leg edema.        Past Medical History:  Diagnosis Date  . Anxiety   . Cardiomyopathy (HCC) 08/2016   EF 35% with suggestion of anterior wall motion abnormality. Cannot exclude  ischemic versus Takotsubo  . Chicken pox   . Chronic kidney disease    polycystic  . Dehydration    recently hosp  . Depression   . History of kidney stones    cyst on kidney  . Hypertension   . Hypokalemia     Current Medications: Current Meds  Medication Sig  . amLODipine (NORVASC) 2.5 MG tablet TAKE 1 TABLET BY MOUTH EVERY DAY  . aspirin EC 81 MG tablet Take 1 tablet (81 mg total) by mouth daily.  Marland Kitchen atorvastatin (LIPITOR) 20 MG tablet TAKE 1 TABLET (20 MG TOTAL) BY MOUTH DAILY AT 6 PM.  . Cholecalciferol (VITAMIN D3) 125 MCG (5000 UT) CAPS Take by mouth daily.  . clotrimazole-betamethasone (LOTRISONE) cream APPLY TOPICALLY 2 (TWO) TIMES DAILY AS NEEDED.  . Esomeprazole Magnesium (NEXIUM PO) Take by mouth as needed.  . nystatin (MYCOSTATIN/NYSTOP) powder Apply topically 3 (three) times daily.  . ondansetron (ZOFRAN ODT) 4 MG disintegrating tablet Take 1 tablet (4 mg total) by mouth daily as needed for nausea or vomiting.  . potassium chloride SA (KLOR-CON) 20 MEQ tablet Take 20 mEq by mouth daily.     Allergies:   No known allergies   Social History   Tobacco Use  . Smoking status: Never Smoker  . Smokeless tobacco: Never Used  Vaping Use  . Vaping Use: Never used  Substance Use Topics  . Alcohol use:  No  . Drug use: No     Family Hx: The patient's family history includes Colon cancer in her sister; Hyperlipidemia in her mother; Hypertension in her father and sister; Lung cancer in her father; Mental illness in her mother; Other in her sister.  Review of Systems  Gastrointestinal: Negative for hematochezia and melena.  Genitourinary: Negative for hematuria.     EKGs/Labs/Other Test Reviewed:    EKG:  EKG is   ordered today.  The ekg ordered today demonstrates atrial fibrillation, HR 74, rightward axis, PVC, non-specific ST-TW changes, QTc 424 ms   Recent Labs: 06/24/2019: Hemoglobin 13.7; Platelets 288.0; TSH 1.70 12/31/2019: ALT 7; BUN 17; Creat 1.34; Potassium  4.4; Sodium 143   Recent Lipid Panel Lab Results  Component Value Date/Time   CHOL 156 01/17/2017 11:47 AM   TRIG 74.0 01/17/2017 11:47 AM   HDL 53.30 01/17/2017 11:47 AM   CHOLHDL 3 01/17/2017 11:47 AM   LDLCALC 88 01/17/2017 11:47 AM      Risk Assessment/Calculations:     CHA2DS2-VASc Score = 4  This indicates a 4.8% annual risk of stroke. The patient's score is based upon: CHF History: 0 HTN History: 1 Diabetes History: 0 Stroke History: 0 Vascular Disease History: 1 Age Score: 1 Gender Score: 1     Physical Exam:    VS:  BP 122/74   Pulse 74   Ht 5\' 1"  (1.549 m)   Wt 110 lb 6.4 oz (50.1 kg)   SpO2 96%   BMI 20.86 kg/m     Wt Readings from Last 3 Encounters:  03/24/20 110 lb 6.4 oz (50.1 kg)  12/31/19 118 lb (53.5 kg)  07/11/19 113 lb (51.3 kg)     Constitutional:      Appearance: Healthy appearance. Not in distress.  Neck:     Vascular: JVD normal.  Pulmonary:     Effort: Pulmonary effort is normal.     Breath sounds: No wheezing. No rales.  Cardiovascular:     Normal rate. Regularly irregular rhythm. Normal S1. Normal S2.     Murmurs: There is no murmur.  Edema:    Peripheral edema absent.  Abdominal:     Palpations: Abdomen is soft.     Tenderness: There is no abdominal tenderness.  Skin:    General: Skin is warm and dry.  Neurological:     Mental Status: Alert and oriented to person, place and time.     Cranial Nerves: Cranial nerves are intact.      ASSESSMENT & PLAN:    1. Preoperative cardiovascular examination   Colleen Davis's perioperative risk of a major cardiac event is 0.9% according to the Revised Cardiac Risk Index (RCRI).  Therefore, she is at low risk for perioperative complications.   Her functional capacity is fair at 4.64 METs according to the Duke Activity Status Index (DASI).  Her HR is well controlled in atrial fibrillation and should not present any issue in the perioperative period.   Recommendations: According to  ACC/AHA guidelines, no further cardiovascular testing needed.  The patient may proceed to surgery at acceptable risk.   Antiplatelet and/or Anticoagulation Recommendations: Aspirin can be held for 7 days prior to her surgery and not resumed.    She will require anticoagulation with Apixaban in the future.  She can start on Apixaban 5 mg twice daily after her surgery as soon as it is felt to be safe from a bleeding perspective.    2. New onset atrial fibrillation (HCC)  HR is controlled.  She has never had this recognized. She does not seem to be symptomatic.  CHA2DS2-VASc Score = 4 [CHF History: 0, HTN History: 1, Diabetes History: 0, Stroke History: 0, Vascular Disease History: 1, Age Score: 1, Gender Score: 1].  Therefore, the patient's annual risk of stroke is 4.8 %.  She will require long term anticoagulation.  We contacted her surgeon's office.  She will have her cholecystectomy in the next 2-3 weeks.  Therefore, I will not have her start anticoagulation until after her surgery.  Her age is < 80 and SCr < 1.5.  Therefore, she will take Apixaban 5 mg twice daily.  She can DC ASA.  I will have her f/u with Dr. Herbie Baltimore after her surgery.  At f/u, she can be considered for +/- DCCV.  In the perioperative period, if her HR is uncontrolled, she can be started on metoprolol tartrate.  If her HR is uncontrolled and her BP is too low to start beta-blocker Rx, she can be placed on IV Amiodarone.  If she is in the hospital more than 24 hours, our service can follow along for her AFib.    3. Takotsubo cardiomyopathy - resolved EF was 30-35 in the past.  Echocardiogram in 4/21 with normal EF.    4. PVC's (premature ventricular contractions) No PVCs evident on EKG today.  EF normal on echocardiogram in 4/21.    5. Essential hypertension The patient's blood pressure is controlled on her current regimen.  Continue current therapy.      Dispo:  Return in about 8 weeks (around 05/19/2020) for Routine Follow Up  with Dr. Herbie Baltimore.   Medication Adjustments/Labs and Tests Ordered: Current medicines are reviewed at length with the patient today.  Concerns regarding medicines are outlined above.  Tests Ordered: Orders Placed This Encounter  Procedures  . EKG 12-Lead   Medication Changes: Meds ordered this encounter  Medications  . apixaban (ELIQUIS) 5 MG TABS tablet    Sig: Take 1 tablet (5 mg total) by mouth 2 (two) times daily.    Dispense:  60 tablet    Refill:  6    Signed, Tereso Newcomer, PA-C  03/24/2020 4:29 PM    [facility] [address] [place], [place], [place]  [address] Phone: [phone]; Fax: [phone]

## 2020-03-24 NOTE — Patient Instructions (Signed)
Medication Instructions:  Your physician has recommended you make the following change in your medication:   1) Stop Aspirin 81 mg 7 days before surgery 2) Start Eliquis 5 mg, 1 tablet by mouth twice a day; start this medication after your surgery when your surgeon says it is ok   *If you need a refill on your cardiac medications before your next appointment, please call your pharmacy*  Lab Work: None ordered today  Testing/Procedures: None ordered today  Follow-Up: At [facility], you and your health needs are our priority.  As part of our continuing mission to provide you with exceptional heart care, we have created designated Provider Care Teams.  These Care Teams include your primary Cardiologist (physician) and Advanced Practice Providers (APPs -  Physician Assistants and Nurse Practitioners) who all work together to provide you with the care you need, when you need it.  Your next appointment:   6-8 week(s)  The format for your next appointment:   In Person  Provider:   Bryan Lemma, MD

## 2020-03-25 ENCOUNTER — Other Ambulatory Visit: Payer: Self-pay | Admitting: Family Medicine

## 2020-04-07 ENCOUNTER — Other Ambulatory Visit: Payer: Self-pay | Admitting: Family Medicine

## 2020-04-07 DIAGNOSIS — R110 Nausea: Secondary | ICD-10-CM

## 2020-04-14 MED ORDER — ONDANSETRON 4 MG PO TBDP: 4.0000 mg | ORAL_TABLET | Freq: Every day | ORAL | 0 refills | Status: AC | PRN

## 2020-04-17 ENCOUNTER — Other Ambulatory Visit: Payer: Self-pay | Admitting: Family Medicine

## 2020-04-17 DIAGNOSIS — I10 Essential (primary) hypertension: Secondary | ICD-10-CM

## 2020-04-21 ENCOUNTER — Encounter: Payer: Self-pay | Admitting: Family Medicine

## 2020-04-24 DIAGNOSIS — N1831 Chronic kidney disease, stage 3a: Secondary | ICD-10-CM | POA: Diagnosis not present

## 2020-04-24 DIAGNOSIS — Z7689 Persons encountering health services in other specified circumstances: Secondary | ICD-10-CM | POA: Diagnosis not present

## 2020-04-24 DIAGNOSIS — G309 Alzheimer's disease, unspecified: Secondary | ICD-10-CM | POA: Diagnosis not present

## 2020-04-24 DIAGNOSIS — E876 Hypokalemia: Secondary | ICD-10-CM | POA: Diagnosis not present

## 2020-04-24 DIAGNOSIS — I10 Essential (primary) hypertension: Secondary | ICD-10-CM | POA: Diagnosis not present

## 2020-04-24 DIAGNOSIS — Z0389 Encounter for observation for other suspected diseases and conditions ruled out: Secondary | ICD-10-CM | POA: Diagnosis not present

## 2020-04-24 NOTE — Progress Notes (Signed)
[id number] [place], [place] - [address] [place] [address] Phone: [phone] Fax: [phone]      Your procedure is scheduled on 04/29/2020.  Report to [facility] Main Entrance "A" at 1:00 P.M., and check in at the Admitting office.  Call this number if you have problems the morning of surgery:  [phone]  Call [phone] if you have any questions prior to your surgery date Monday-Friday 8am-4pm    Remember:  Do not eat after midnight the night before your surgery  You may drink clear liquids until 12:00pm the morning of your surgery.   Clear liquids allowed are: Water, Non-Citrus Juices (without pulp), Carbonated Beverages, Clear Tea, Black Coffee Only, and Gatorade    Take these medicines the morning of surgery with A SIP OF WATER  amLODipine (NORVASC) Esomeprazole Magnesium (NEXIUM PO)- if needed ondansetron (ZOFRAN ODT)- if needed  As of today, STOP taking any Aspirin and apixaban ELIQUIS (unless otherwise instructed by your surgeon) Aleve, Naproxen, Ibuprofen, Motrin, Advil, Goody's, BC's, all herbal medications, fish oil, and all vitamins.                      Do not wear jewelry, make up, or nail polish            Do not wear lotions, powders, perfumes/colognes, or deodorant.            Do not shave 48 hours prior to surgery.  Men may shave face and neck.            Do not bring valuables to the hospital.            [facility] is not responsible for any belongings or valuables.  Do NOT Smoke (Tobacco/Vaping) or drink Alcohol 24 hours prior to your procedure If you use a CPAP at night, you may bring all equipment for your overnight stay.   Contacts, glasses, dentures or bridgework may not be worn into surgery.      For patients admitted to the hospital, discharge time will be determined by your treatment team.   Patients discharged the day of surgery will not be allowed to drive home, and someone needs to stay with  them for 24 hours.    Special instructions:   [place]- Preparing For Surgery  Before surgery, you can play an important role. Because skin is not sterile, your skin needs to be as free of germs as possible. You can reduce the number of germs on your skin by washing with CHG (chlorahexidine gluconate) Soap before surgery.  CHG is an antiseptic cleaner which kills germs and bonds with the skin to continue killing germs even after washing.    Oral Hygiene is also important to reduce your risk of infection.  Remember - BRUSH YOUR TEETH THE MORNING OF SURGERY WITH YOUR REGULAR TOOTHPASTE  Please do not use if you have an allergy to CHG or antibacterial soaps. If your skin becomes reddened/irritated stop using the CHG.  Do not shave (including legs and underarms) for at least 48 hours prior to first CHG shower. It is OK to shave your face.  Please follow these instructions carefully.   1. Shower the NIGHT BEFORE SURGERY and the MORNING OF SURGERY with CHG Soap.   2. If you chose to wash your hair, wash your hair first as usual with your normal shampoo.  3. After you shampoo, rinse your hair and body thoroughly to remove the shampoo.  4. Use CHG as you would any other liquid soap. You can apply CHG directly to the skin and wash gently with a scrungie or a clean washcloth.   5. Apply the CHG Soap to your body ONLY FROM THE NECK DOWN.  Do not use on open wounds or open sores. Avoid contact with your eyes, ears, mouth and genitals (private parts). Wash Face and genitals (private parts)  with your normal soap.   6. Wash thoroughly, paying special attention to the area where your surgery will be performed.  7. Thoroughly rinse your body with warm water from the neck down.  8. DO NOT shower/wash with your normal soap after using and rinsing off the CHG Soap.  9. Pat yourself dry with a CLEAN TOWEL.  10. Wear CLEAN PAJAMAS to bed the night before surgery  11. Place CLEAN SHEETS on your bed  the night of your first shower and DO NOT SLEEP WITH PETS.   Day of Surgery: Wear Clean/Comfortable clothing the morning of surgery Do not apply any deodorants/lotions.   Remember to brush your teeth WITH YOUR REGULAR TOOTHPASTE.   Please read over the following fact sheets that you were given.

## 2020-04-27 ENCOUNTER — Encounter (HOSPITAL_COMMUNITY): Payer: Self-pay

## 2020-04-27 ENCOUNTER — Encounter (HOSPITAL_COMMUNITY)
Admission: RE | Admit: 2020-04-27 | Discharge: 2020-04-27 | Disposition: A | Discharge status: Home / Self Care | Payer: Medicare Other | Source: Ambulatory Visit | Attending: Surgery | Admitting: Surgery

## 2020-04-27 ENCOUNTER — Other Ambulatory Visit: Payer: Self-pay

## 2020-04-27 ENCOUNTER — Other Ambulatory Visit (HOSPITAL_COMMUNITY)
Admission: RE | Admit: 2020-04-27 | Discharge: 2020-04-27 | Disposition: A | Discharge status: Home / Self Care | Payer: Medicare Other | Source: Ambulatory Visit | Attending: Surgery | Admitting: Surgery

## 2020-04-27 DIAGNOSIS — Z01812 Encounter for preprocedural laboratory examination: Secondary | ICD-10-CM | POA: Insufficient documentation

## 2020-04-27 DIAGNOSIS — Z20822 Contact with and (suspected) exposure to covid-19: Secondary | ICD-10-CM | POA: Insufficient documentation

## 2020-04-27 HISTORY — DX: Unspecified dementia, unspecified severity, without behavioral disturbance, psychotic disturbance, mood disturbance, and anxiety: F03.90

## 2020-04-27 HISTORY — DX: Cardiac arrhythmia, unspecified: I49.9

## 2020-04-27 LAB — CBC
HCT: 40.7 % (ref 36.0–46.0)
Hemoglobin: 12.4 g/dL (ref 12.0–15.0)
MCH: 30.4 pg (ref 26.0–34.0)
MCHC: 30.5 g/dL (ref 30.0–36.0)
MCV: 99.8 fL (ref 80.0–100.0)
Platelets: 521 10*3/uL — ABNORMAL HIGH (ref 150–400)
RBC: 4.08 MIL/uL (ref 3.87–5.11)
RDW: 13.3 % (ref 11.5–15.5)
WBC: 10.3 10*3/uL (ref 4.0–10.5)
nRBC: 0 % (ref 0.0–0.2)

## 2020-04-27 LAB — BASIC METABOLIC PANEL
Anion gap: 14 (ref 5–15)
BUN: 16 mg/dL (ref 8–23)
CO2: 24 mmol/L (ref 22–32)
Calcium: 9.6 mg/dL (ref 8.9–10.3)
Chloride: 103 mmol/L (ref 98–111)
Creatinine, Ser: 1.28 mg/dL — ABNORMAL HIGH (ref 0.44–1.00)
GFR, Estimated: 44 mL/min — ABNORMAL LOW (ref >60)
Glucose, Bld: 177 mg/dL — ABNORMAL HIGH (ref 70–99)
Potassium: 3.6 mmol/L (ref 3.5–5.1)
Sodium: 141 mmol/L (ref 135–145)

## 2020-04-27 LAB — SARS CORONAVIRUS 2 (TAT 6-24 HRS): SARS Coronavirus 2: NEGATIVE

## 2020-04-27 NOTE — Progress Notes (Addendum)
PCP - Betty Swaziland Cardiologist - Onalee Hua harding  PPM/ICD - denies   Chest x-ray - n/a EKG - 03/24/2020 Stress Test - 09/14/2016 ECHO - 08/08/2019 Cardiac Cath - denies  Sleep Study - denies    Patient instructed to hold all Aspirin, NSAID's, herbal medications, fish oil and vitamins 7 days prior to surgery. Pt has not started eliquis yet. Will start after she received clearance from Dr. Corliss Skains.   ERAS Protcol -yes  COVID TEST- 04/27/20 completed   Anesthesia review: yes, cardiac history  Patient denies shortness of breath, fever, cough and chest pain at PAT appointment   All instructions explained to the patient, with a verbal understanding of the material. Patient agrees to go over the instructions while at home for a better understanding. Patient also instructed to self quarantine after being tested for COVID-19. The opportunity to ask questions was provided.

## 2020-04-28 NOTE — Anesthesia Preprocedure Evaluation (Signed)
Anesthesia Evaluation  Patient identified by MRN, date of birth, ID band Patient awake    Reviewed: Allergy & Precautions, NPO status , Patient's Chart, lab work & pertinent test results  Airway Mallampati: II  TM Distance: >3 FB     Dental   Pulmonary neg pulmonary ROS,    breath sounds clear to auscultation       Cardiovascular hypertension, + dysrhythmias  Rhythm:Regular Rate:Normal     Neuro/Psych negative neurological ROS     GI/Hepatic negative GI ROS, Neg liver ROS,   Endo/Other  negative endocrine ROS  Renal/GU Renal disease     Musculoskeletal   Abdominal   Peds  Hematology   Anesthesia Other Findings   Reproductive/Obstetrics                            Anesthesia Physical Anesthesia Plan  ASA: III  Anesthesia Plan: General   Post-op Pain Management:    Induction: Intravenous  PONV Risk Score and Plan: 3 and Ondansetron, Dexamethasone and Midazolam  Airway Management Planned: Oral ETT  Additional Equipment:   Intra-op Plan:   Post-operative Plan: Extubation in OR  Informed Consent:     Dental advisory given  Plan Discussed with:   Anesthesia Plan Comments: (PAT note by Antionette Poles, PA-C: Follows with cardiology for history of Takotsubo cardiomyopathy (with recovery of EF) and new onset atrial fibrillation as of office visit 03/24/2020 with Tereso Newcomer, PA-C at which time she was also cleared for surgery.  Per note, "Preoperative cardiovascular examination: Ms. Mcdonagh's perioperative risk of a major cardiac event is 0.9% according to the Revised Cardiac Risk Index (RCRI).  Therefore, she is at low risk for perioperative complications.   Her functional capacity is fair at 4.64 METs according to the Duke Activity Status Index (DASI).  Her HR is well controlled in atrial fibrillation and should not present any issue in the perioperative period.    Recommendations: According to ACC/AHA guidelines, no further cardiovascular testing needed.  The patient may proceed to surgery at acceptable risk.   Antiplatelet and/or Anticoagulation Recommendations: Aspirin can be held for 7 days prior to her surgery and not resumed.    She will require anticoagulation with Apixaban in the future.  She can start on Apixaban 5 mg twice daily after her surgery as soon as it is felt to be safe from a bleeding perspective."  CKD 3 followed by PCP Dr. Betty [place]il[place].  Preop labs reviewed, creatinine mildly elevated at 1.28, platelets mildly elevated at 521, otherwise unremarkable.  EKG 03/24/2020: Atrial fibrillation.  Rate 74.  Rightward axis.  Nonspecific ST and T wave changes.  Echocardiogram 08/08/2019 EF 55-60, GLS -19.3, no RWMA, normal diastolic function, normal RVSF, mild MR, mild AI, mild aortic valve sclerosis (no AS), mild dilation of the aortic root (38 mm)  Myoview 09/14/2016 EF 55, no ischemia or infarction  Echocardiogram 08/18/2016 EF 30-35 with severe HK of the distal septal, distal inferior, mid/distal anterior, mid/distal anterolateral walls and apical AK, trivial AI, mild MR  Carotid US 11/17/2011 No significant ICA stenosis bilaterally  )       Anesthesia Quick Evaluation

## 2020-04-28 NOTE — Progress Notes (Signed)
Anesthesia Chart Review:  Follows with cardiology for history of Takotsubo cardiomyopathy (with recovery of EF) and new onset atrial fibrillation as of office visit 03/24/2020 with Tereso Newcomer, PA-C at which time she was also cleared for surgery.  Per note, "Preoperative cardiovascular examination: Ms. Norcia's perioperative risk of a major cardiac event is 0.9% according to the Revised Cardiac Risk Index (RCRI).  Therefore, she is at low risk for perioperative complications.   Her functional capacity is fair at 4.64 METs according to the Duke Activity Status Index (DASI).  Her HR is well controlled in atrial fibrillation and should not present any issue in the perioperative period.   Recommendations: According to ACC/AHA guidelines, no further cardiovascular testing needed.  The patient may proceed to surgery at acceptable risk.   Antiplatelet and/or Anticoagulation Recommendations: Aspirin can be held for 7 days prior to her surgery and not resumed.    She will require anticoagulation with Apixaban in the future.  She can start on Apixaban 5 mg twice daily after her surgery as soon as it is felt to be safe from a bleeding perspective."  CKD 3 followed by PCP Dr. Betty [place]il[place].  Preop labs reviewed, creatinine mildly elevated at 1.28, platelets mildly elevated at 521, otherwise unremarkable.  EKG 03/24/2020: Atrial fibrillation.  Rate 74.  Rightward axis.  Nonspecific ST and T wave changes.  Echocardiogram 08/08/2019 EF 55-60, GLS -19.3, no RWMA, normal diastolic function, normal RVSF, mild MR, mild AI, mild aortic valve sclerosis (no AS), mild dilation of the aortic root (38 mm)  Myoview 09/14/2016 EF 55, no ischemia or infarction  Echocardiogram 08/18/2016 EF 30-35 with severe HK of the distal septal, distal inferior, mid/distal anterior, mid/distal anterolateral walls and apical AK, trivial AI, mild MR  Carotid US 11/17/2011 No significant ICA stenosis bilaterally   Zannie [facility]ove [facility]  Short Stay Center/Anesthesiology Phone [phone] 04/28/2020 8:54 AM

## 2020-04-29 ENCOUNTER — Ambulatory Visit (HOSPITAL_COMMUNITY): Payer: Medicare Other

## 2020-04-29 ENCOUNTER — Other Ambulatory Visit: Payer: Self-pay

## 2020-04-29 ENCOUNTER — Ambulatory Visit (HOSPITAL_COMMUNITY)
Admission: RE | Admit: 2020-04-29 | Discharge: 2020-04-29 | Disposition: A | Discharge status: Home / Self Care | Payer: Medicare Other | Attending: Surgery | Admitting: Surgery

## 2020-04-29 ENCOUNTER — Ambulatory Visit (HOSPITAL_COMMUNITY): Payer: Medicare Other | Admitting: Vascular Surgery

## 2020-04-29 ENCOUNTER — Encounter (HOSPITAL_COMMUNITY): Payer: Self-pay | Admitting: Surgery

## 2020-04-29 ENCOUNTER — Ambulatory Visit (HOSPITAL_COMMUNITY): Payer: Medicare Other | Admitting: Anesthesiology

## 2020-04-29 ENCOUNTER — Encounter (HOSPITAL_COMMUNITY)
Admission: RE | Disposition: A | Discharge status: Home / Self Care | Payer: Self-pay | Source: Home / Self Care | Attending: Surgery

## 2020-04-29 DIAGNOSIS — K8018 Calculus of gallbladder with other cholecystitis without obstruction: Secondary | ICD-10-CM | POA: Diagnosis present

## 2020-04-29 DIAGNOSIS — I130 Hypertensive heart and chronic kidney disease with heart failure and stage 1 through stage 4 chronic kidney disease, or unspecified chronic kidney disease: Secondary | ICD-10-CM | POA: Diagnosis not present

## 2020-04-29 DIAGNOSIS — Z79899 Other long term (current) drug therapy: Secondary | ICD-10-CM | POA: Insufficient documentation

## 2020-04-29 DIAGNOSIS — K8010 Calculus of gallbladder with chronic cholecystitis without obstruction: Secondary | ICD-10-CM | POA: Insufficient documentation

## 2020-04-29 DIAGNOSIS — Z9889 Other specified postprocedural states: Secondary | ICD-10-CM | POA: Diagnosis not present

## 2020-04-29 DIAGNOSIS — Z419 Encounter for procedure for purposes other than remedying health state, unspecified: Secondary | ICD-10-CM

## 2020-04-29 DIAGNOSIS — I5022 Chronic systolic (congestive) heart failure: Secondary | ICD-10-CM | POA: Diagnosis not present

## 2020-04-29 DIAGNOSIS — N183 Chronic kidney disease, stage 3 unspecified: Secondary | ICD-10-CM | POA: Diagnosis not present

## 2020-04-29 HISTORY — PX: CHOLECYSTECTOMY: SHX55

## 2020-04-29 SURGERY — LAPAROSCOPIC CHOLECYSTECTOMY WITH INTRAOPERATIVE CHOLANGIOGRAM
Anesthesia: General | Site: Abdomen

## 2020-04-29 MED ORDER — FENTANYL CITRATE (PF) 100 MCG/2ML IJ SOLN
25.0000 ug | INTRAMUSCULAR | Status: DC | PRN
  Administered 2020-04-29: 50 ug via INTRAVENOUS

## 2020-04-29 MED ORDER — CEFAZOLIN SODIUM-DEXTROSE 2-4 GM/100ML-% IV SOLN
2.0000 g | INTRAVENOUS | Status: AC
  Administered 2020-04-29: 2 g via INTRAVENOUS
  Filled 2020-04-29: qty 100

## 2020-04-29 MED ORDER — LACTATED RINGERS IV SOLN: INTRAVENOUS | Status: DC

## 2020-04-29 MED ORDER — PHENYLEPHRINE 40 MCG/ML (10ML) SYRINGE FOR IV PUSH (FOR BLOOD PRESSURE SUPPORT)
PREFILLED_SYRINGE | INTRAVENOUS | Status: AC
  Filled 2020-04-29: qty 10

## 2020-04-29 MED ORDER — BUPIVACAINE-EPINEPHRINE (PF) 0.25% -1:200000 IJ SOLN
INTRAMUSCULAR | Status: AC
  Filled 2020-04-29: qty 30

## 2020-04-29 MED ORDER — DEXAMETHASONE SODIUM PHOSPHATE 10 MG/ML IJ SOLN
INTRAMUSCULAR | Status: AC
  Filled 2020-04-29: qty 2

## 2020-04-29 MED ORDER — ORAL CARE MOUTH RINSE: 15.0000 mL | Freq: Once | OROMUCOSAL | Status: AC

## 2020-04-29 MED ORDER — HEMOSTATIC AGENTS (NO CHARGE) OPTIME
TOPICAL | Status: DC | PRN
  Administered 2020-04-29: 1 via TOPICAL

## 2020-04-29 MED ORDER — CHLORHEXIDINE GLUCONATE CLOTH 2 % EX PADS: 6.0000 | MEDICATED_PAD | Freq: Once | CUTANEOUS | Status: DC

## 2020-04-29 MED ORDER — ROCURONIUM BROMIDE 10 MG/ML (PF) SYRINGE
PREFILLED_SYRINGE | INTRAVENOUS | Status: DC | PRN
  Administered 2020-04-29: 30 mg via INTRAVENOUS
  Administered 2020-04-29: 10 mg via INTRAVENOUS

## 2020-04-29 MED ORDER — SUCCINYLCHOLINE CHLORIDE 200 MG/10ML IV SOSY
PREFILLED_SYRINGE | INTRAVENOUS | Status: AC
  Filled 2020-04-29: qty 10

## 2020-04-29 MED ORDER — SODIUM CHLORIDE 0.9 % IV SOLN
INTRAVENOUS | Status: DC | PRN
  Administered 2020-04-29: 100 mL

## 2020-04-29 MED ORDER — ROCURONIUM BROMIDE 10 MG/ML (PF) SYRINGE
PREFILLED_SYRINGE | INTRAVENOUS | Status: AC
  Filled 2020-04-29: qty 10

## 2020-04-29 MED ORDER — LIDOCAINE 2% (20 MG/ML) 5 ML SYRINGE
INTRAMUSCULAR | Status: DC | PRN
  Administered 2020-04-29: 60 mg via INTRAVENOUS

## 2020-04-29 MED ORDER — LIDOCAINE 2% (20 MG/ML) 5 ML SYRINGE
INTRAMUSCULAR | Status: AC
  Filled 2020-04-29: qty 15

## 2020-04-29 MED ORDER — ONDANSETRON HCL 4 MG/2ML IJ SOLN
INTRAMUSCULAR | Status: AC
  Filled 2020-04-29: qty 4

## 2020-04-29 MED ORDER — FENTANYL CITRATE (PF) 250 MCG/5ML IJ SOLN
INTRAMUSCULAR | Status: AC
  Filled 2020-04-29: qty 5

## 2020-04-29 MED ORDER — MIDAZOLAM HCL 2 MG/2ML IJ SOLN
INTRAMUSCULAR | Status: AC
  Filled 2020-04-29: qty 2

## 2020-04-29 MED ORDER — SUCCINYLCHOLINE CHLORIDE 200 MG/10ML IV SOSY
PREFILLED_SYRINGE | INTRAVENOUS | Status: DC | PRN
  Administered 2020-04-29: 120 mg via INTRAVENOUS

## 2020-04-29 MED ORDER — FENTANYL CITRATE (PF) 100 MCG/2ML IJ SOLN
INTRAMUSCULAR | Status: AC
  Administered 2020-04-29: 50 ug via INTRAVENOUS
  Filled 2020-04-29: qty 2

## 2020-04-29 MED ORDER — PHENYLEPHRINE HCL-NACL 10-0.9 MG/250ML-% IV SOLN
INTRAVENOUS | Status: DC | PRN
  Administered 2020-04-29: 40 ug/min via INTRAVENOUS

## 2020-04-29 MED ORDER — CHLORHEXIDINE GLUCONATE 0.12 % MT SOLN
15.0000 mL | Freq: Once | OROMUCOSAL | Status: AC
  Administered 2020-04-29: 15 mL via OROMUCOSAL
  Filled 2020-04-29: qty 15

## 2020-04-29 MED ORDER — PROPOFOL 10 MG/ML IV BOLUS
INTRAVENOUS | Status: DC | PRN
  Administered 2020-04-29: 130 mg via INTRAVENOUS

## 2020-04-29 MED ORDER — SUGAMMADEX SODIUM 200 MG/2ML IV SOLN
INTRAVENOUS | Status: DC | PRN
  Administered 2020-04-29: 100 mg via INTRAVENOUS

## 2020-04-29 MED ORDER — BUPIVACAINE-EPINEPHRINE 0.25% -1:200000 IJ SOLN
INTRAMUSCULAR | Status: DC | PRN
  Administered 2020-04-29: 7 mL

## 2020-04-29 MED ORDER — GABAPENTIN 300 MG PO CAPS
300.0000 mg | ORAL_CAPSULE | ORAL | Status: AC
  Administered 2020-04-29: 300 mg via ORAL
  Filled 2020-04-29: qty 1

## 2020-04-29 MED ORDER — ACETAMINOPHEN 500 MG PO TABS
1000.0000 mg | ORAL_TABLET | ORAL | Status: AC
  Administered 2020-04-29: 1000 mg via ORAL
  Filled 2020-04-29: qty 2

## 2020-04-29 MED ORDER — OXYCODONE HCL 5 MG PO TABS: 5.0000 mg | ORAL_TABLET | Freq: Four times a day (QID) | ORAL | 0 refills | Status: AC | PRN

## 2020-04-29 MED ORDER — ONDANSETRON HCL 4 MG/2ML IJ SOLN
INTRAMUSCULAR | Status: DC | PRN
  Administered 2020-04-29: 4 mg via INTRAVENOUS

## 2020-04-29 MED ORDER — DEXAMETHASONE SODIUM PHOSPHATE 10 MG/ML IJ SOLN
INTRAMUSCULAR | Status: DC | PRN
  Administered 2020-04-29: 10 mg via INTRAVENOUS

## 2020-04-29 MED ORDER — FENTANYL CITRATE (PF) 250 MCG/5ML IJ SOLN
INTRAMUSCULAR | Status: DC | PRN
  Administered 2020-04-29: 50 ug via INTRAVENOUS
  Administered 2020-04-29: 25 ug via INTRAVENOUS

## 2020-04-29 SURGICAL SUPPLY — 43 items
APPLIER CLIP ROT 10 11.4 M/L (STAPLE) ×2
BENZOIN TINCTURE PRP APPL 2/3 (GAUZE/BANDAGES/DRESSINGS) ×2 IMPLANT
BLADE CLIPPER SURG (BLADE) IMPLANT
CANISTER SUCT 3000ML PPV (MISCELLANEOUS) ×2 IMPLANT
CHLORAPREP W/TINT 26 (MISCELLANEOUS) ×2 IMPLANT
CLIP APPLIE ROT 10 11.4 M/L (STAPLE) ×1 IMPLANT
COVER MAYO STAND STRL (DRAPES) ×2 IMPLANT
COVER SURGICAL LIGHT HANDLE (MISCELLANEOUS) ×2 IMPLANT
COVER WAND RF STERILE (DRAPES) ×2 IMPLANT
DRAPE C-ARM 42X120 X-RAY (DRAPES) ×2 IMPLANT
DRSG TEGADERM 2-3/8X2-3/4 SM (GAUZE/BANDAGES/DRESSINGS) ×6 IMPLANT
DRSG TEGADERM 4X4.75 (GAUZE/BANDAGES/DRESSINGS) ×2 IMPLANT
ELECT REM PT RETURN 9FT ADLT (ELECTROSURGICAL) ×2
ELECTRODE REM PT RTRN 9FT ADLT (ELECTROSURGICAL) ×1 IMPLANT
GAUZE SPONGE 2X2 8PLY STRL LF (GAUZE/BANDAGES/DRESSINGS) ×1 IMPLANT
GLOVE BIO SURGEON STRL SZ7 (GLOVE) ×2 IMPLANT
GLOVE BIOGEL PI IND STRL 7.5 (GLOVE) ×1 IMPLANT
GLOVE BIOGEL PI INDICATOR 7.5 (GLOVE) ×1
GOWN STRL REUS W/ TWL LRG LVL3 (GOWN DISPOSABLE) ×3 IMPLANT
GOWN STRL REUS W/TWL LRG LVL3 (GOWN DISPOSABLE) ×6
KIT BASIN OR (CUSTOM PROCEDURE TRAY) ×2 IMPLANT
KIT TURNOVER KIT B (KITS) ×2 IMPLANT
NS IRRIG 1000ML POUR BTL (IV SOLUTION) ×2 IMPLANT
PAD ARMBOARD 7.5X6 YLW CONV (MISCELLANEOUS) ×2 IMPLANT
POUCH RETRIEVAL ECOSAC 10 (ENDOMECHANICALS) IMPLANT
POUCH RETRIEVAL ECOSAC 10MM (ENDOMECHANICALS)
POUCH SPECIMEN RETRIEVAL 10MM (ENDOMECHANICALS) ×2 IMPLANT
SCISSORS LAP 5X35 DISP (ENDOMECHANICALS) ×2 IMPLANT
SET CHOLANGIOGRAPH 5 50 .035 (SET/KITS/TRAYS/PACK) ×2 IMPLANT
SET IRRIG TUBING LAPAROSCOPIC (IRRIGATION / IRRIGATOR) ×2 IMPLANT
SET TUBE SMOKE EVAC HIGH FLOW (TUBING) ×2 IMPLANT
SLEEVE ENDOPATH XCEL 5M (ENDOMECHANICALS) ×2 IMPLANT
SPECIMEN JAR SMALL (MISCELLANEOUS) ×2 IMPLANT
SPONGE GAUZE 2X2 STER 10/PKG (GAUZE/BANDAGES/DRESSINGS) ×1
STRIP CLOSURE SKIN 1/2X4 (GAUZE/BANDAGES/DRESSINGS) ×2 IMPLANT
SUT MNCRL AB 4-0 PS2 18 (SUTURE) ×2 IMPLANT
TOWEL GREEN STERILE (TOWEL DISPOSABLE) ×2 IMPLANT
TOWEL GREEN STERILE FF (TOWEL DISPOSABLE) ×2 IMPLANT
TRAY LAPAROSCOPIC MC (CUSTOM PROCEDURE TRAY) ×2 IMPLANT
TROCAR XCEL BLUNT TIP 100MML (ENDOMECHANICALS) ×2 IMPLANT
TROCAR XCEL NON-BLD 11X100MML (ENDOMECHANICALS) ×2 IMPLANT
TROCAR XCEL NON-BLD 5MMX100MML (ENDOMECHANICALS) ×2 IMPLANT
WATER STERILE IRR 1000ML POUR (IV SOLUTION) ×2 IMPLANT

## 2020-04-29 NOTE — Transfer of Care (Signed)
Immediate Anesthesia Transfer of Care Note  Patient: Colleen Davis  Procedure(s) Performed: LAPAROSCOPIC CHOLECYSTECTOMY WITH INTRAOPERATIVE CHOLANGIOGRAM (N/A Abdomen)  Patient Location: PACU  Anesthesia Type:General  Level of Consciousness: drowsy  Airway & Oxygen Therapy: Patient Spontanous Breathing and Patient connected to nasal cannula oxygen  Post-op Assessment: Report given to RN and Post -op Vital signs reviewed and stable  Post vital signs: Reviewed and stable  Last Vitals:  Vitals Value Taken Time  BP 138/86 04/29/20 1645  Temp    Pulse 74 04/29/20 1649  Resp 26 04/29/20 1649  SpO2 100 % 04/29/20 1649  Vitals shown include unvalidated device data.  Last Pain:  Vitals:   04/29/20 1340  TempSrc:   PainSc: 0-No pain         Complications: No complications documented.

## 2020-04-29 NOTE — Anesthesia Procedure Notes (Signed)
Procedure Name: Intubation Date/Time: 04/29/2020 2:44 PM Performed by: Elliot Dally, CRNA Pre-anesthesia Checklist: Patient identified, Emergency Drugs available, Suction available and Patient being monitored Patient Re-evaluated:Patient Re-evaluated prior to induction Oxygen Delivery Method: Circle System Utilized Preoxygenation: Pre-oxygenation with 100% oxygen Induction Type: IV induction Ventilation: Mask ventilation without difficulty Laryngoscope Size: Miller and 1 Grade View: Grade I Tube type: Oral Tube size: 7.0 mm Number of attempts: 1 Airway Equipment and Method: Stylet and Oral airway Placement Confirmation: ETT inserted through vocal cords under direct vision,  positive ETCO2 and breath sounds checked- equal and bilateral Secured at: 21 cm Tube secured with: Tape Dental Injury: Teeth and Oropharynx as per pre-operative assessment

## 2020-04-29 NOTE — Anesthesia Postprocedure Evaluation (Signed)
Anesthesia Post Note  Patient: Colleen Davis  Procedure(s) Performed: LAPAROSCOPIC CHOLECYSTECTOMY WITH INTRAOPERATIVE CHOLANGIOGRAM (N/A Abdomen)     Patient location during evaluation: PACU Anesthesia Type: General Level of consciousness: awake Pain management: pain level controlled Vital Signs Assessment: post-procedure vital signs reviewed and stable Respiratory status: spontaneous breathing Cardiovascular status: stable Postop Assessment: no apparent nausea or vomiting Anesthetic complications: no   No complications documented.  Last Vitals:  Vitals:   04/29/20 1337 04/29/20 1645  BP: (!) 169/81 138/86  Pulse: (!) 53 72  Resp: 18 (!) 23  Temp: 36.4 C 36.4 C  SpO2: 97% 100%    Last Pain:  Vitals:   04/29/20 1340  TempSrc:   PainSc: 0-No pain                 Ashley Montminy

## 2020-04-29 NOTE — Op Note (Signed)
Laparoscopic Cholecystectomy with IOC Procedure Note  Indications: This is a 75 year old female who presented in 2018 with a history of persistent nausea and vomiting as well as weight loss. She has had significant constipation. She denies any diarrhea. Her symptoms seem to be exacerbated by eating. She is able to keep down liquids. Zofran does not seem to be effective in controlling her nausea. She was evaluated in the emergency department on 3/30 and had a CT scan of the abdomen and pelvis showing gallstones but no sign of acute cholecystitis. She also had some mild submucosal fatty deposition within the cecum and ascending colon. The etiology of this was unclear. The patient has never had a colonoscopy despite a family history of colon cancer in her sister. The patient continues to be fairly nauseated and continues to lose weight.  She was scheduled for surgery but upon presentation for surgery, she was noted to be obtunded with significant pulmonary issues. Her surgery was canceled. However the patient never followed up to reschedule her surgery. Apparently she has been having more symptoms with persistent nausea. Recent ultrasound showed a 2 cm gallstone with some smaller surrounding gallstones and sludge. Liver function tests in September were normal.   Pre-operative Diagnosis: Calculus of gallbladder with other cholecystitis, without mention of obstruction  Post-operative Diagnosis: Same  Surgeon: Wynona Luna   Resident:  Dr. Vernard Gambles  I was personally present during the key and critical portions of this procedure and immediately available throughout the entire procedure, as documented in my operative note.   Anesthesia: General endotracheal anesthesia  ASA Class: 2  Procedure Details  The patient was seen again in the Holding Room. The risks, benefits, complications, treatment options, and expected outcomes were discussed with the patient. The possibilities of  reaction to medication, pulmonary aspiration, perforation of viscus, bleeding, recurrent infection, finding a normal gallbladder, the need for additional procedures, failure to diagnose a condition, the possible need to convert to an open procedure, and creating a complication requiring transfusion or operation were discussed with the patient. The likelihood of improving the patient's symptoms with return to their baseline status is good.  The patient and/or family concurred with the proposed plan, giving informed consent. The site of surgery properly noted. The patient was taken to Operating Room, identified as Colleen Davis and the procedure verified as Laparoscopic Cholecystectomy with Intraoperative Cholangiogram. A Time Out was held and the above information confirmed.  Prior to the induction of general anesthesia, antibiotic prophylaxis was administered. General endotracheal anesthesia was then administered and tolerated well. After the induction, the abdomen was prepped with Chloraprep and draped in the sterile fashion. The patient was positioned in the supine position.  Local anesthetic agent was injected into the skin near the umbilicus and an incision made. We dissected down to the abdominal fascia with blunt dissection.  The fascia was incised vertically and we entered the peritoneal cavity bluntly.  A pursestring suture of 0-Vicryl was placed around the fascial opening.  The Hasson cannula was inserted and secured with the stay suture.  Pneumoperitoneum was then created with CO2 and tolerated well without any adverse changes in the patient's vital signs. An 11-mm port was placed in the subxiphoid position.  Two 5-mm ports were placed in the right upper quadrant. All skin incisions were infiltrated with a local anesthetic agent before making the incision and placing the trocars.   We positioned the patient in reverse Trendelenburg, tilted slightly to the patient's left.  The gallbladder  was  identified.  It was quite distended so we had to decompress it with the suction aspirator.  The fluid was non-bilious, indicating chronic cystic duct obstruction.  The fundus was grasped and retracted cephalad. Adhesions were lysed bluntly and with the electrocautery where indicated, taking care not to injure any adjacent organs or viscus. The infundibulum was grasped and retracted laterally, exposing the peritoneum overlying the triangle of Calot. This was then divided and exposed in a blunt fashion. A critical view of the cystic duct and cystic artery was obtained.  The cystic duct was clearly identified and bluntly dissected circumferentially. The cystic duct was ligated with a clip distally.   An incision was made in the cystic duct and the [facility] cholangiogram catheter introduced. The catheter was secured using a clip. A cholangiogram was then obtained which showed good visualization of the distal and proximal biliary tree with no sign of filling defects or obstruction.  Contrast flowed easily into the duodenum. The catheter was then removed.   The cystic duct was then ligated with clips and divided. The cystic artery was identified, dissected free, ligated with clips and divided as well.   The gallbladder was dissected from the liver bed in retrograde fashion with the electrocautery. The gallbladder was removed and placed in an Endocatch sac. The liver bed was irrigated and inspected. Hemostasis was achieved with the electrocautery and SNOW. Copious irrigation was utilized and was repeatedly aspirated until clear.  The gallbladder and Endocatch sac were then removed through the umbilical port site.  The pursestring suture was used to close the umbilical fascia.    We again inspected the right upper quadrant for hemostasis.  Pneumoperitoneum was released as we removed the trocars.  4-0 Monocryl was used to close the skin.   Benzoin, steri-strips, and clean dressings were applied. The patient was then  extubated and brought to the recovery room in stable condition. Instrument, sponge, and needle counts were correct at closure and at the conclusion of the case.   Findings: Cholecystitis with Cholelithiasis  Estimated Blood Loss: less than 50 mL         Drains: none         Specimens: Gallbladder           Complications: None; patient tolerated the procedure well.         Disposition: PACU - hemodynamically stable.         Condition: stable  Colleen Arms. Corliss Skains, MD, [facility]  General/ Trauma Surgery   04/29/2020 4:34 PM

## 2020-04-29 NOTE — Discharge Instructions (Signed)
[facility] ______[facility] °LAPAROSCOPIC SURGERY: POST OP INSTRUCTIONS °Always review your discharge instruction sheet given to you by the facility where your surgery was performed. °IF YOU HAVE DISABILITY OR FAMILY LEAVE FORMS, YOU MUST BRING THEM TO THE OFFICE FOR PROCESSING.   °DO NOT GIVE THEM TO YOUR DOCTOR. ° °1. A prescription for pain medication may be given to you upon discharge.  Take your pain medication as prescribed, if needed.  If narcotic pain medicine is not needed, then you may take acetaminophen (Tylenol) or ibuprofen (Advil) as needed. °2. Take your usually prescribed medications unless otherwise directed. °3. If you need a refill on your pain medication, please contact your pharmacy.  They will contact our office to request authorization. Prescriptions will not be filled after 5pm or on week-ends. °4. You should follow a light diet the first few days after arrival home, such as soup and crackers, etc.  Be sure to include lots of fluids daily. °5. Most patients will experience some swelling and bruising in the area of the incisions.  Ice packs will help.  Swelling and bruising can take several days to resolve.  °6. It is common to experience some constipation if taking pain medication after surgery.  Increasing fluid intake and taking a stool softener (such as Colace) will usually help or prevent this problem from occurring.  A mild laxative (Milk of Magnesia or Miralax) should be taken according to package instructions if there are no bowel movements after 48 hours. °7. Unless discharge instructions indicate otherwise, you may remove your bandages 24-48 hours after surgery, and you may shower at that time.  You may have steri-strips (small skin tapes) in place directly over the incision.  These strips should be left on the skin for 7-10 days.  If your surgeon used skin glue on the incision, you may shower in 24 hours.  The glue will flake off over the next 2-3 weeks.  Any sutures or  staples will be removed at the office during your follow-up visit. °8. ACTIVITIES:  You may resume regular (light) daily activities beginning the next day--such as daily self-care, walking, climbing stairs--gradually increasing activities as tolerated.  You may have sexual intercourse when it is comfortable.  Refrain from any heavy lifting or straining until approved by your doctor. °a. You may drive when you are no longer taking prescription pain medication, you can comfortably wear a seatbelt, and you can safely maneuver your car and apply brakes. °b. RETURN TO WORK:  __________________________________________________________ °9. You should see your doctor in the office for a follow-up appointment approximately 2-3 weeks after your surgery.  Make sure that you call for this appointment within a day or two after you arrive home to insure a convenient appointment time. °10. OTHER INSTRUCTIONS: __________________________________________________________________________________________________________________________ __________________________________________________________________________________________________________________________ °WHEN TO CALL YOUR DOCTOR: °1. Fever over 101.0 °2. Inability to urinate °3. Continued bleeding from incision. °4. Increased pain, redness, or drainage from the incision. °5. Increasing abdominal pain ° °The clinic staff is available to answer your questions during regular business hours.  Please don’t hesitate to call and ask to speak to one of the nurses for clinical concerns.  If you have a medical emergency, go to the nearest emergency room or call 911.  A surgeon from [facility] is always on call at the hospital. °[address], [address], [place], [place]  [address] ? [address] [address], [place], [place]   [address] °[phone] ? [phone] ? FAX [phone] °Web site:   [email or web address] °

## 2020-04-29 NOTE — H&P (Signed)
History of Present Illness The patient is a 75 year old female who presents for evaluation of gall stones. Referred by Dr. Betty Swaziland for symptomatic gallstones GI - Dr. Evette Cristal   This is a 75 year old female who presented in 2018 with a history of persistent nausea and vomiting as well as weight loss. She has had significant constipation. She denies any diarrhea. Her symptoms seem to be exacerbated by eating. She is able to keep down liquids. Zofran does not seem to be effective in controlling her nausea. She was evaluated in the emergency department on 3/30 and had a CT scan of the abdomen and pelvis showing gallstones but no sign of acute cholecystitis. She also had some mild submucosal fatty deposition within the cecum and ascending colon. The etiology of this was unclear. The patient has never had a colonoscopy despite a family history of colon cancer in her sister. The patient continues to be fairly nauseated and continues to lose weight.  She was scheduled for surgery but upon presentation for surgery, she was noted to be obtunded with significant pulmonary issues. Her surgery was canceled. However the patient never followed up to reschedule her surgery. Apparently she has been having more symptoms with persistent nausea. Recent ultrasound showed a 2 cm gallstone with some smaller surrounding gallstones and sludge. Liver function tests in September were normal.  CLINICAL DATA: Nausea and vomiting  EXAM: ULTRASOUND ABDOMEN LIMITED RIGHT UPPER QUADRANT  COMPARISON: CT abdomen and pelvis August 07, 2018  FINDINGS: Gallbladder:  Within the gallbladder, there are echogenic foci which move and shadow consistent with cholelithiasis. Largest gallstone measures 2.0 cm in length. There is mild sludge in the gallbladder. The gallbladder appears mildly distended. There is no appreciable gallbladder wall thickening or pericholecystic fluid. No sonographic Murphy sign noted  by sonographer.  Common bile duct:  Diameter: 4 mm. No intrahepatic or extrahepatic biliary duct dilatation.  Liver:  No focal lesion identified. Liver echogenicity overall is increased and mildly coarsened. Portal vein is patent on color Doppler imaging with normal direction of blood flow towards the liver.  Other: There are multiple renal cysts, better delineated on prior CT.  IMPRESSION: 1. Cholelithiasis and sludge in gallbladder. Gallbladder is mildly distended. Similar appearance compared to prior study. This finding potentially may warrant correlation with nuclear medicine hepatobiliary imaging study to assess for cystic duct patency.  2. Liver echogenicity is increased and somewhat coarsened, an appearance indicative of hepatic steatosis with questionable underlying parenchymal liver disease. No focal liver lesions evident. Note that the sensitivity of ultrasound for detection of focal liver lesions is somewhat diminished in this circumstance.  3. Renal cysts on the right, better seen on prior CT.  These results will be called to the ordering clinician or representative by the Radiologist Assistant, and communication documented in the PACS or [employer].   Electronically Signed By: Bretta Bang III M.D. On: 01/07/2020 13:33   Problem List/Past Medical  NAUSEA AND/OR VOMITING (R11.2) HYPOKALEMIA (E87.6) CHRONIC CHOLECYSTITIS WITH CALCULUS (K80.10)  Past Surgical History No pertinent past surgical history  Diagnostic Studies History  Colonoscopy never Mammogram >3 years ago Pap Smear >5 years ago  Allergies  No Known Drug Allergies   Medication History  Promethazine HCl (12.5MG  Tablet, 1 (one) Tablet Oral four times daily, as needed, Taken starting 08/01/2016) Active. Potassium Chloride ER ( Tablet ER, 2 (two) Tablet Oral daily, Taken starting 08/15/2016) Active. ALPRAZolam (0.25MG  Tablet, Oral)  Active. Atorvastatin Calcium (20MG  Tablet, Oral) Active. Lisinopril (20MG  Tablet, Oral)  Active. Zofran (4MG  Tablet, Oral) Active. Senna (8.6MG  Tablet, Oral) Active. Lipitor (20MG  Tablet, Oral) Active. Norvasc (5MG  Tablet, Oral) Active. Omeprazole (20MG  Tablet DR, Oral) Active. Aspirin (81MG  Tablet, Oral) Active. Carvedilol (12.5MG  Tablet, Oral) Active. Nystatin (100000 UNIT/GM Cream, External) Active. Medications Reconciled  Social History  Caffeine use Carbonated beverages. No alcohol use No drug use Tobacco use Never smoker.  Family History  Cancer Father. Colon Cancer Sister. Hypertension Mother. Thyroid problems Mother.  Pregnancy / Birth History  Age at menarche 16 years. Age of menopause 68-50 Gravida 35 Maternal age 80-25 Para 3  Other Problems  Anxiety Disorder Back Pain Cholelithiasis Gastroesophageal Reflux Disease High blood pressure Hypercholesterolemia    Vitals  Weight: 115.38 lb Height: 62in Body Surface Area: 1.51 m Body Mass Index: 21.1 kg/m  Temp.: 97.66F  Pulse: 102 (Regular)  P.OX: 97% (Room air) BP: 140/80(Sitting, Left Arm, Standard)        Physical Exam  The physical exam findings are as follows: Note:Constitutional: WDWN in NAD, conversant, no obvious deformities; resting comfortably Eyes: Pupils equal, round; sclera anicteric; moist conjunctiva; no lid lag HENT: Oral mucosa moist; good dentition Neck: No masses palpated, trachea midline; no thyromegaly Lungs: CTA bilaterally; normal respiratory effort CV: Regular rate and rhythm; no murmurs; extremities well-perfused with no edema Abd: +bowel sounds, soft, non-tender, no palpable organomegaly; no palpable hernias Musc: Weak, slow gait; no apparent clubbing or cyanosis in extremities Lymphatic: No palpable cervical or axillary lymphadenopathy Skin: Warm, dry; no sign of jaundice Psychiatric - alert and oriented x 4;  calm mood and affect    Assessment & Plan   CHRONIC CHOLECYSTITIS WITH CALCULUS (K80.10)   NAUSEA AND/OR VOMITING (R11.2)  Current Plans Schedule for Surgery - Laparoscopic cholecystectomy with intraoperative cholangiogram. The surgical procedure has been discussed with the patient. Potential risks, benefits, alternative treatments, and expected outcomes have been explained. All of the patient's questions at this time have been answered. The likelihood of reaching the patient's treatment goal is good. The patient understand the proposed surgical procedure and wishes to proceed.   . , MD, [facility]  General/ Trauma Surgery   04/29/2020 1:24 PM

## 2020-04-30 ENCOUNTER — Encounter (HOSPITAL_COMMUNITY): Payer: Self-pay | Admitting: Surgery

## 2020-05-01 LAB — SURGICAL PATHOLOGY

## 2020-05-12 ENCOUNTER — Ambulatory Visit: Payer: Medicare Other | Admitting: Cardiology

## 2020-05-20 DIAGNOSIS — Z1211 Encounter for screening for malignant neoplasm of colon: Secondary | ICD-10-CM | POA: Diagnosis not present

## 2020-05-20 DIAGNOSIS — Z13820 Encounter for screening for osteoporosis: Secondary | ICD-10-CM | POA: Diagnosis not present

## 2020-05-20 DIAGNOSIS — Z1231 Encounter for screening mammogram for malignant neoplasm of breast: Secondary | ICD-10-CM | POA: Diagnosis not present

## 2020-05-20 DIAGNOSIS — I10 Essential (primary) hypertension: Secondary | ICD-10-CM | POA: Diagnosis not present

## 2020-05-20 DIAGNOSIS — R110 Nausea: Secondary | ICD-10-CM | POA: Diagnosis not present

## 2020-05-20 DIAGNOSIS — Z0000 Encounter for general adult medical examination without abnormal findings: Secondary | ICD-10-CM | POA: Diagnosis not present

## 2020-05-20 DIAGNOSIS — R627 Adult failure to thrive: Secondary | ICD-10-CM | POA: Diagnosis not present

## 2020-05-20 DIAGNOSIS — R634 Abnormal weight loss: Secondary | ICD-10-CM | POA: Diagnosis not present

## 2020-05-20 DIAGNOSIS — Z780 Asymptomatic menopausal state: Secondary | ICD-10-CM | POA: Diagnosis not present

## 2021-01-11 ENCOUNTER — Telehealth: Payer: Self-pay

## 2021-01-11 NOTE — Telephone Encounter (Signed)
Left message asking patient to return call to office to schedule AWV. Maryjean Morn, CMA

## 2021-05-19 DEATH — deceased
